# Patient Record
Sex: Female | Born: 1970 | Race: White | Hispanic: No | Marital: Married | Smoking: Never smoker
Health system: Southern US, Community
[De-identification: ages and names within clinical notes are randomized; demographics above are authoritative.]

## PROBLEM LIST (undated history)

## (undated) DIAGNOSIS — Z0000 Encounter for general adult medical examination without abnormal findings: Secondary | ICD-10-CM

## (undated) DIAGNOSIS — C801 Malignant (primary) neoplasm, unspecified: Secondary | ICD-10-CM

## (undated) DIAGNOSIS — Z8632 Personal history of gestational diabetes: Secondary | ICD-10-CM

## (undated) DIAGNOSIS — F418 Other specified anxiety disorders: Secondary | ICD-10-CM

## (undated) DIAGNOSIS — K219 Gastro-esophageal reflux disease without esophagitis: Secondary | ICD-10-CM

## (undated) DIAGNOSIS — E785 Hyperlipidemia, unspecified: Secondary | ICD-10-CM

## (undated) DIAGNOSIS — O24419 Gestational diabetes mellitus in pregnancy, unspecified control: Secondary | ICD-10-CM

## (undated) DIAGNOSIS — M549 Dorsalgia, unspecified: Secondary | ICD-10-CM

## (undated) DIAGNOSIS — H6691 Otitis media, unspecified, right ear: Secondary | ICD-10-CM

## (undated) DIAGNOSIS — B354 Tinea corporis: Secondary | ICD-10-CM

## (undated) DIAGNOSIS — L309 Dermatitis, unspecified: Secondary | ICD-10-CM

## (undated) DIAGNOSIS — T7840XA Allergy, unspecified, initial encounter: Secondary | ICD-10-CM

## (undated) DIAGNOSIS — F32A Depression, unspecified: Secondary | ICD-10-CM

## (undated) DIAGNOSIS — J209 Acute bronchitis, unspecified: Secondary | ICD-10-CM

## (undated) DIAGNOSIS — B019 Varicella without complication: Secondary | ICD-10-CM

## (undated) DIAGNOSIS — J189 Pneumonia, unspecified organism: Secondary | ICD-10-CM

## (undated) DIAGNOSIS — B009 Herpesviral infection, unspecified: Secondary | ICD-10-CM

## (undated) DIAGNOSIS — J029 Acute pharyngitis, unspecified: Secondary | ICD-10-CM

## (undated) DIAGNOSIS — R5383 Other fatigue: Secondary | ICD-10-CM

## (undated) DIAGNOSIS — F329 Major depressive disorder, single episode, unspecified: Secondary | ICD-10-CM

## (undated) DIAGNOSIS — R1012 Left upper quadrant pain: Secondary | ICD-10-CM

## (undated) DIAGNOSIS — Z8719 Personal history of other diseases of the digestive system: Secondary | ICD-10-CM

## (undated) DIAGNOSIS — F988 Other specified behavioral and emotional disorders with onset usually occurring in childhood and adolescence: Secondary | ICD-10-CM

## (undated) DIAGNOSIS — K859 Acute pancreatitis without necrosis or infection, unspecified: Secondary | ICD-10-CM

## (undated) DIAGNOSIS — Z124 Encounter for screening for malignant neoplasm of cervix: Secondary | ICD-10-CM

## (undated) DIAGNOSIS — E119 Type 2 diabetes mellitus without complications: Secondary | ICD-10-CM

## (undated) DIAGNOSIS — B001 Herpesviral vesicular dermatitis: Secondary | ICD-10-CM

## (undated) DIAGNOSIS — E663 Overweight: Secondary | ICD-10-CM

## (undated) HISTORY — DX: Other specified anxiety disorders: F41.8

## (undated) HISTORY — DX: Dorsalgia, unspecified: M54.9

## (undated) HISTORY — DX: Dermatitis, unspecified: L30.9

## (undated) HISTORY — PX: ESOPHAGOGASTRODUODENOSCOPY: SHX1529

## (undated) HISTORY — DX: Other fatigue: R53.83

## (undated) HISTORY — PX: UPPER GASTROINTESTINAL ENDOSCOPY: SHX188

## (undated) HISTORY — DX: Acute pancreatitis without necrosis or infection, unspecified: K85.90

## (undated) HISTORY — DX: Other specified behavioral and emotional disorders with onset usually occurring in childhood and adolescence: F98.8

## (undated) HISTORY — DX: Allergy, unspecified, initial encounter: T78.40XA

## (undated) HISTORY — DX: Personal history of gestational diabetes: Z86.32

## (undated) HISTORY — DX: Hyperlipidemia, unspecified: E78.5

## (undated) HISTORY — DX: Overweight: E66.3

## (undated) HISTORY — DX: Otitis media, unspecified, right ear: H66.91

## (undated) HISTORY — DX: Encounter for general adult medical examination without abnormal findings: Z00.00

## (undated) HISTORY — DX: Acute pharyngitis, unspecified: J02.9

## (undated) HISTORY — DX: Herpesviral infection, unspecified: B00.9

## (undated) HISTORY — DX: Depression, unspecified: F32.A

## (undated) HISTORY — DX: Pneumonia, unspecified organism: J18.9

## (undated) HISTORY — DX: Varicella without complication: B01.9

## (undated) HISTORY — DX: Acute bronchitis, unspecified: J20.9

## (undated) HISTORY — DX: Gastro-esophageal reflux disease without esophagitis: K21.9

## (undated) HISTORY — DX: Malignant (primary) neoplasm, unspecified: C80.1

## (undated) HISTORY — DX: Left upper quadrant pain: R10.12

## (undated) HISTORY — DX: Gestational diabetes mellitus in pregnancy, unspecified control: O24.419

## (undated) HISTORY — DX: Tinea corporis: B35.4

## (undated) HISTORY — DX: Major depressive disorder, single episode, unspecified: F32.9

## (undated) HISTORY — DX: Personal history of other diseases of the digestive system: Z87.19

## (undated) HISTORY — DX: Encounter for screening for malignant neoplasm of cervix: Z12.4

## (undated) HISTORY — DX: Herpesviral vesicular dermatitis: B00.1

---

## 1996-06-20 DIAGNOSIS — J189 Pneumonia, unspecified organism: Secondary | ICD-10-CM

## 1996-06-20 HISTORY — DX: Pneumonia, unspecified organism: J18.9

## 1998-06-20 HISTORY — PX: CHOLECYSTECTOMY: SHX55

## 2005-06-20 HISTORY — PX: APPENDECTOMY: SHX54

## 2006-12-28 ENCOUNTER — Emergency Department (HOSPITAL_COMMUNITY): Admission: EM | Admit: 2006-12-28 | Discharge: 2006-12-28 | Payer: Self-pay | Admitting: Emergency Medicine

## 2010-05-20 LAB — HM PAP SMEAR

## 2010-07-11 ENCOUNTER — Encounter: Payer: Self-pay | Admitting: Family Medicine

## 2011-04-18 ENCOUNTER — Encounter: Payer: Self-pay | Admitting: Family Medicine

## 2011-04-18 ENCOUNTER — Ambulatory Visit (INDEPENDENT_AMBULATORY_CARE_PROVIDER_SITE_OTHER): Payer: Managed Care, Other (non HMO) | Admitting: Family Medicine

## 2011-04-18 VITALS — BP 108/75 | HR 98 | Temp 98.1°F | Ht 65.0 in | Wt 195.8 lb

## 2011-04-18 DIAGNOSIS — O99810 Abnormal glucose complicating pregnancy: Secondary | ICD-10-CM

## 2011-04-18 DIAGNOSIS — Z8719 Personal history of other diseases of the digestive system: Secondary | ICD-10-CM

## 2011-04-18 DIAGNOSIS — B009 Herpesviral infection, unspecified: Secondary | ICD-10-CM | POA: Insufficient documentation

## 2011-04-18 DIAGNOSIS — J45909 Unspecified asthma, uncomplicated: Secondary | ICD-10-CM

## 2011-04-18 DIAGNOSIS — E663 Overweight: Secondary | ICD-10-CM

## 2011-04-18 DIAGNOSIS — Z23 Encounter for immunization: Secondary | ICD-10-CM

## 2011-04-18 DIAGNOSIS — F341 Dysthymic disorder: Secondary | ICD-10-CM

## 2011-04-18 DIAGNOSIS — O24419 Gestational diabetes mellitus in pregnancy, unspecified control: Secondary | ICD-10-CM

## 2011-04-18 DIAGNOSIS — F418 Other specified anxiety disorders: Secondary | ICD-10-CM

## 2011-04-18 DIAGNOSIS — Z0000 Encounter for general adult medical examination without abnormal findings: Secondary | ICD-10-CM

## 2011-04-18 DIAGNOSIS — K219 Gastro-esophageal reflux disease without esophagitis: Secondary | ICD-10-CM

## 2011-04-18 DIAGNOSIS — T7840XA Allergy, unspecified, initial encounter: Secondary | ICD-10-CM

## 2011-04-18 HISTORY — DX: Herpesviral infection, unspecified: B00.9

## 2011-04-18 HISTORY — DX: Other specified anxiety disorders: F41.8

## 2011-04-18 MED ORDER — OMEPRAZOLE 20 MG PO CPDR: 20.0000 mg | DELAYED_RELEASE_CAPSULE | Freq: Every day | ORAL | Status: DC

## 2011-04-18 MED ORDER — CITALOPRAM HYDROBROMIDE 10 MG PO TABS: 10.0000 mg | ORAL_TABLET | Freq: Every day | ORAL | Status: DC

## 2011-04-18 MED ORDER — ALPRAZOLAM 0.25 MG PO TABS: 0.2500 mg | ORAL_TABLET | Freq: Three times a day (TID) | ORAL | Status: DC | PRN

## 2011-04-18 NOTE — Patient Instructions (Signed)

## 2011-04-19 ENCOUNTER — Encounter: Payer: Self-pay | Admitting: Family Medicine

## 2011-04-19 DIAGNOSIS — Z8632 Personal history of gestational diabetes: Secondary | ICD-10-CM | POA: Insufficient documentation

## 2011-04-19 DIAGNOSIS — E669 Obesity, unspecified: Secondary | ICD-10-CM | POA: Insufficient documentation

## 2011-04-19 DIAGNOSIS — E663 Overweight: Secondary | ICD-10-CM

## 2011-04-19 DIAGNOSIS — O24419 Gestational diabetes mellitus in pregnancy, unspecified control: Secondary | ICD-10-CM

## 2011-04-19 DIAGNOSIS — Z8719 Personal history of other diseases of the digestive system: Secondary | ICD-10-CM

## 2011-04-19 DIAGNOSIS — J45909 Unspecified asthma, uncomplicated: Secondary | ICD-10-CM | POA: Insufficient documentation

## 2011-04-19 DIAGNOSIS — T7840XA Allergy, unspecified, initial encounter: Secondary | ICD-10-CM | POA: Insufficient documentation

## 2011-04-19 DIAGNOSIS — Z1211 Encounter for screening for malignant neoplasm of colon: Secondary | ICD-10-CM | POA: Insufficient documentation

## 2011-04-19 DIAGNOSIS — Z0000 Encounter for general adult medical examination without abnormal findings: Secondary | ICD-10-CM

## 2011-04-19 HISTORY — DX: Personal history of other diseases of the digestive system: Z87.19

## 2011-04-19 HISTORY — DX: Gestational diabetes mellitus in pregnancy, unspecified control: O24.419

## 2011-04-19 HISTORY — DX: Overweight: E66.3

## 2011-04-19 HISTORY — DX: Personal history of gestational diabetes: Z86.32

## 2011-04-19 HISTORY — DX: Encounter for general adult medical examination without abnormal findings: Z00.00

## 2011-04-19 NOTE — Assessment & Plan Note (Signed)
Encouraged to consider the DASH diet and increase activity levels

## 2011-04-19 NOTE — Assessment & Plan Note (Signed)
Given flu shot today, she believes she had a tetanus shot in 2008 but it is unclear whether she had the pertussis portion. Will request old records.

## 2011-04-19 NOTE — Assessment & Plan Note (Signed)
Patient very tearful due to her husbands illness and some difficulties with their children. She agrees to counseling and is given some Citalopram 10mg  to take daily and some Alprazolam to use sparingly

## 2011-04-19 NOTE — Assessment & Plan Note (Signed)
Uses topical  Treatments and tablet forms of Antivirals when she has a flare, has not had a flare recently

## 2011-04-19 NOTE — Assessment & Plan Note (Signed)
Has had several episodes in past none recently

## 2011-04-19 NOTE — Assessment & Plan Note (Signed)
Continue Cetirizine.

## 2011-04-19 NOTE — Progress Notes (Signed)
Kristina Watts [id number] [birth year] 04/19/2011      Progress Note New Patient  Subjective  Chief Complaint  Chief Complaint  Patient presents with  . Establish Care    new patient    HPI  Caucasian female from [place] for new patient appointment. She is tearful and clearly under greater stress due to her husband's poor health. He has progressive secondary multiple sclerosis had an MI this year and is generally in poor health. She also does their 3 young children. She is interested in counseling and medications to help her manage her stress. Otherwise she has no acute complaints. She has trouble with asthma seasonally but is not having any trouble presently. No recent febrile illness, congestion, chest pain, palpitations, shortness of breath, GI or GU complaints. She's previously used Prozac when she first trouble with depression back in 2006 and will help her depression it seems his apprehension and her interest in doing anything. She used Wellbutrin in the past which helped the depression as well but that may unremarkable.  Past Medical History  Diagnosis Date  . Chicken pox as a child  . Pneumonia 1998  . Allergy     seasonal  . Asthma   . Depression   . Recurrent cold sores   . Depression with anxiety 04/18/2011  . 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 Overweight 04/19/2011  . GDM (gestational diabetes mellitus) 04/19/2011  . History of pancreatitis 04/19/2011  . Allergic state 04/19/2011  . Preventative health care 04/19/2011  . Asthma 04/19/2011    Past Surgical History  Procedure Date  . Appendectomy 2007  . Cholecystectomy 2000  . Cesarean section 1-02 and 4-04    Family History  Problem Relation Age of Onset  . Thyroid disease Mother   . Hypertension Father   . Hyperlipidemia Father   . Heart disease Father   . Diabetes Father   . COPD Father     previous smoker  . Stroke Father   . Depression Father   . Restless legs syndrome Father     . Diabetes Brother   . Cancer Maternal Grandmother     Head and Neck  . Heart disease Paternal Grandmother   . Hyperlipidemia Paternal Grandmother   . Hypertension Paternal Grandmother   . Diabetes Paternal Grandmother   . Depression Paternal Grandmother   . Stroke Paternal Grandmother   . Restless legs syndrome Paternal Grandmother   . Heart disease Paternal Grandfather   . Hyperlipidemia Paternal Grandfather   . Hypertension Paternal Grandfather   . Stroke Paternal Grandfather   . Diabetes Paternal Grandfather     History   Social History  . Marital Status: Married    Spouse Name: N/A    Number of Children: N/A  . Years of Education: N/A   Occupational History  . Not on file.   Social History Main Topics  . Smoking status: Never Smoker   . Smokeless tobacco: Never Used  . Alcohol Use: Yes     rarely  . Drug Use: No  . Sexually Active: Yes -- Female partner(s)   Other Topics Concern  . Not on file   Social History Narrative  . No narrative on file    No current outpatient prescriptions on file prior to visit.    Allergies  Allergen Reactions  . Actifed (Triprolidine-Pse) Hives    Review of Systems  Review of Systems  Constitutional: Negative for fever, chills and malaise/fatigue.  HENT: Negative for hearing  loss, nosebleeds and congestion.   Eyes: Negative for pain and discharge.  Respiratory: Negative for cough, sputum production, shortness of breath and wheezing.   Cardiovascular: Negative for chest pain, palpitations and leg swelling.  Gastrointestinal: Negative for heartburn, nausea, vomiting, abdominal pain, diarrhea, constipation and blood in stool.  Genitourinary: Negative for dysuria, urgency, frequency and hematuria.  Musculoskeletal: Negative for myalgias, back pain and falls.  Skin: Negative for rash.  Neurological: Negative for dizziness, tremors, sensory change, focal weakness, loss of consciousness, weakness and headaches.   Endo/Heme/Allergies: Negative for polydipsia. Does not bruise/bleed easily.  Psychiatric/Behavioral: Negative for depression and suicidal ideas. The patient is not nervous/anxious and does not have insomnia.     Objective  BP 108/75  Pulse 98  Temp(Src) 98.1 F (36.7 C) (Oral)  Ht 5\' 5"  (1.651 m)  Wt 195 lb 12.8 oz (88.814 kg)  BMI 32.58 kg/m2  SpO2 98%  LMP 03/28/2011  Physical Exam  Physical Exam  Constitutional: She is oriented to person, place, and time and well-developed, well-nourished, and in no distress. No distress.  HENT:  Head: Normocephalic and atraumatic.  Right Ear: External ear normal.  Left Ear: External ear normal.  Nose: Nose normal.  Mouth/Throat: Oropharynx is clear and moist. No oropharyngeal exudate.  Eyes: Conjunctivae are normal. Pupils are equal, round, and reactive to light. Right eye exhibits no discharge. Left eye exhibits no discharge. No scleral icterus.  Neck: Normal range of motion. Neck supple. No thyromegaly present.  Cardiovascular: Normal rate, regular rhythm, normal heart sounds and intact distal pulses.   No murmur heard. Pulmonary/Chest: Effort normal and breath sounds normal. No respiratory distress. She has no wheezes. She has no rales.  Abdominal: Soft. Bowel sounds are normal. She exhibits no distension and no mass. There is no tenderness.  Musculoskeletal: Normal range of motion. She exhibits no edema and no tenderness.  Lymphadenopathy:    She has no cervical adenopathy.  Neurological: She is alert and oriented to person, place, and time. She has normal reflexes. No cranial nerve deficit. Coordination normal.  Skin: Skin is warm and dry. No rash noted. She is not diaphoretic.  Psychiatric: Mood, memory and affect normal.       Assessment & Plan  Recurrent HSV (herpes simplex virus) Uses topical  Treatments and tablet forms of Antivirals when she has a flare, has not had a flare recently  Preventative health care Given flu  shot today, she believes she had a tetanus shot in 2008 but it is unclear whether she had the pertussis portion. Will request old records.   Overweight Encouraged to consider the DASH diet and increase activity levels  History of pancreatitis Has had several episodes in past none recently  Depression with anxiety Patient very tearful due to her husbands illness and some difficulties with their children. She agrees to counseling and is given some Citalopram 10mg  to take daily and some Alprazolam to use sparingly  Asthma Only uses her Symbicort prn and her Albuterol prn. Has seasonal flares, encouraged to use the Symbicort more routinely during her bad seasons  Allergic state Continue Cetirizine

## 2011-04-19 NOTE — Assessment & Plan Note (Signed)
Only uses her Symbicort prn and her Albuterol prn. Has seasonal flares, encouraged to use the Symbicort more routinely during her bad seasons

## 2011-05-16 ENCOUNTER — Other Ambulatory Visit (INDEPENDENT_AMBULATORY_CARE_PROVIDER_SITE_OTHER): Payer: Managed Care, Other (non HMO)

## 2011-05-16 DIAGNOSIS — Z0000 Encounter for general adult medical examination without abnormal findings: Secondary | ICD-10-CM

## 2011-05-16 LAB — CBC
Hemoglobin: 13.3 g/dL (ref 12.0–15.0)
MCHC: 34.2 g/dL (ref 30.0–36.0)
Platelets: 242 10*3/uL (ref 150.0–400.0)
RBC: 4.18 Mil/uL (ref 3.87–5.11)
WBC: 6 10*3/uL (ref 4.5–10.5)

## 2011-05-16 LAB — TSH: TSH: 0.98 u[IU]/mL (ref 0.35–5.50)

## 2011-05-16 LAB — HEPATIC FUNCTION PANEL
AST: 17 U/L (ref 0–37)
Albumin: 4.1 g/dL (ref 3.5–5.2)
Alkaline Phosphatase: 57 U/L (ref 39–117)
Total Protein: 6.9 g/dL (ref 6.0–8.3)

## 2011-05-16 LAB — RENAL FUNCTION PANEL
Albumin: 4.1 g/dL (ref 3.5–5.2)
CO2: 28 mEq/L (ref 19–32)
Calcium: 8.6 mg/dL (ref 8.4–10.5)
Chloride: 105 mEq/L (ref 96–112)
Glucose, Bld: 77 mg/dL (ref 70–99)
Potassium: 4.4 mEq/L (ref 3.5–5.1)
Sodium: 140 mEq/L (ref 135–145)

## 2011-05-16 LAB — LIPID PANEL
Cholesterol: 166 mg/dL (ref 0–200)
VLDL: 12.6 mg/dL (ref 0.0–40.0)

## 2011-05-16 LAB — T4, FREE: Free T4: 0.94 ng/dL (ref 0.60–1.60)

## 2011-05-18 ENCOUNTER — Encounter: Payer: Self-pay | Admitting: Family Medicine

## 2011-05-18 ENCOUNTER — Ambulatory Visit (INDEPENDENT_AMBULATORY_CARE_PROVIDER_SITE_OTHER): Payer: Managed Care, Other (non HMO) | Admitting: Family Medicine

## 2011-05-18 VITALS — BP 120/85 | HR 76 | Temp 98.0°F | Ht 65.0 in | Wt 196.8 lb

## 2011-05-18 DIAGNOSIS — B354 Tinea corporis: Secondary | ICD-10-CM

## 2011-05-18 DIAGNOSIS — L309 Dermatitis, unspecified: Secondary | ICD-10-CM | POA: Insufficient documentation

## 2011-05-18 HISTORY — DX: Dermatitis, unspecified: L30.9

## 2011-05-18 HISTORY — DX: Tinea corporis: B35.4

## 2011-05-18 MED ORDER — CLOTRIMAZOLE 1 % EX CREA: TOPICAL_CREAM | Freq: Two times a day (BID) | CUTANEOUS | Status: DC

## 2011-05-18 NOTE — Patient Instructions (Signed)
Ringworm, Body [Tinea Corporis] Ringworm is a fungal infection of the skin and hair. Another name for this problem is Tinea Corporis. It has nothing to do with worms. A fungus is an organism that lives on dead cells (the outer layer of skin). It can involve the entire body. It can spread from infected pets. Tinea corporis can be a problem in wrestlers who may get the infection form other players/opponents, equipment and mats. DIAGNOSIS  A skin scraping can be obtained from the affected area and by looking for fungus under the microscope. This is called a KOH examination.  HOME CARE INSTRUCTIONS   Ringworm may be treated with a topical antifungal cream, ointment, or oral medications.   If you are using a cream or ointment, wash infected skin. Dry it completely before application.   Scrub the skin with a buff puff or abrasive sponge using a shampoo with ketoconazole to remove dead skin and help treat the ringworm.   Have your pet treated by your veterinarian if it has the same infection.  SEEK MEDICAL CARE IF:   Your ringworm patch (fungus) continues to spread after 7 days of treatment.   Your rash is not gone in 4 weeks. Fungal infections are slow to respond to treatment. Some redness (erythema) may remain for several weeks after the fungus is gone.   The area becomes red, warm, tender, and swollen beyond the patch. This may be a secondary bacterial (germ) infection.   You have a fever.  Document Released: 06/03/2000 Document Revised: 02/16/2011 Document Reviewed: 11/14/2008 [facility] Patient Information 2012 [place], [place]. If no response to Clotrimazole after roughly 2 weeks switch to Lamisil For smaller lesion if no response after 1 week switch to Cortaid

## 2011-05-18 NOTE — Assessment & Plan Note (Signed)
Try Clotrimazole bid and if no response after 2 weeks can switch to Lamisil

## 2011-05-18 NOTE — Progress Notes (Signed)
Kristina Watts [id number] [birth year] 05/18/2011      Progress Note-Follow Up  Subjective  Chief Complaint  Chief Complaint  Patient presents with  . Rash    right arm X 1 week    HPI  Patient is a 40 yo Caucasian female with a  1 week history of a circular rash on her right arm near the elbow which has been mildly pruritic at times. She placed some antifungal her husband had on yesterday and she feels like it's slightly improved today. Chest is a smaller less irregularly shaped lesion on the right upper arm which he says is similar to rashes and dermatitis she's had in the past. No other recent illness, congestion or concerns are noted at today's visit  Past Medical History  Diagnosis Date  . Chicken pox as a child  . Pneumonia 1998  . Allergy     seasonal  . Asthma   . Depression   . Recurrent cold sores   . Depression with anxiety 04/18/2011  . 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 Overweight 04/19/2011  . GDM (gestational diabetes mellitus) 04/19/2011  . History of pancreatitis 04/19/2011  . Allergic state 04/19/2011  . Preventative health care 04/19/2011  . Asthma 04/19/2011    Past Surgical History  Procedure Date  . Appendectomy 2007  . Cholecystectomy 2000  . Cesarean section 1-02 and 4-04    Family History  Problem Relation Age of Onset  . Thyroid disease Mother   . Hypertension Father   . Hyperlipidemia Father   . Heart disease Father   . Diabetes Father   . COPD Father     previous smoker  . Stroke Father   . Depression Father   . Restless legs syndrome Father   . Diabetes Brother   . Cancer Maternal Grandmother     Head and Neck  . Heart disease Paternal Grandmother   . Hyperlipidemia Paternal Grandmother   . Hypertension Paternal Grandmother   . Diabetes Paternal Grandmother   . Depression Paternal Grandmother   . Stroke Paternal Grandmother   . Restless legs syndrome Paternal Grandmother   . Heart disease Paternal  Grandfather   . Hyperlipidemia Paternal Grandfather   . Hypertension Paternal Grandfather   . Stroke Paternal Grandfather   . Diabetes Paternal Grandfather     History   Social History  . Marital Status: Married    Spouse Name: N/A    Number of Children: N/A  . Years of Education: N/A   Occupational History  . Not on file.   Social History Main Topics  . Smoking status: Never Smoker   . Smokeless tobacco: Never Used  . Alcohol Use: Yes     rarely  . Drug Use: No  . Sexually Active: Yes -- Female partner(s)   Other Topics Concern  . Not on file   Social History Narrative  . No narrative on file    Current Outpatient Prescriptions on File Prior to Visit  Medication Sig Dispense Refill  . albuterol (PROVENTIL HFA;VENTOLIN HFA) 108 (90 BASE) MCG/ACT inhaler Inhale 2 puffs into the lungs as needed.        . ALPRAZolam (XANAX) 0.25 MG tablet Take 1 tablet (0.25 mg total) by mouth 3 (three) times daily as needed for anxiety.  30 tablet  0  . budesonide-formoterol (SYMBICORT) 160-4.5 MCG/ACT inhaler Inhale 2 puffs into the lungs 2 (two) times daily as needed.        . cetirizine (ZYRTEC)  10 MG tablet Take 10 mg by mouth daily.        . citalopram (CELEXA) 10 MG tablet Take 1 tablet (10 mg total) by mouth daily.  30 tablet  2  . valACYclovir (VALTREX) 1000 MG tablet Take 1,000 mg by mouth 2 (two) times daily.          Allergies  Allergen Reactions  . Actifed (Triprolidine-Pse) Hives    Review of Systems  Review of Systems  Constitutional: Negative for fever and malaise/fatigue.  HENT: Negative for congestion.   Eyes: Negative for discharge.  Respiratory: Negative for shortness of breath.   Cardiovascular: Negative for chest pain, palpitations and leg swelling.  Gastrointestinal: Negative for nausea, abdominal pain and diarrhea.  Genitourinary: Negative for dysuria.  Musculoskeletal: Negative for falls.  Skin: Positive for itching and rash.       Right arm x 1 week    Neurological: Negative for loss of consciousness and headaches.  Endo/Heme/Allergies: Negative for polydipsia.  Psychiatric/Behavioral: Negative for depression and suicidal ideas. The patient is not nervous/anxious and does not have insomnia.     Objective  BP 120/85  Pulse 76  Temp(Src) 98 F (36.7 C) (Oral)  Ht 5\' 5"  (1.651 m)  Wt 196 lb 12.8 oz (89.268 kg)  BMI 32.75 kg/m2  SpO2 97%  LMP 05/02/2011  Physical Exam  Physical Exam  Constitutional: She is oriented to person, place, and time and well-developed, well-nourished, and in no distress. No distress.  HENT:  Head: Normocephalic and atraumatic.  Eyes: Conjunctivae are normal.  Neck: Neck supple. No thyromegaly present.  Cardiovascular: Normal rate, regular rhythm and normal heart sounds.   No murmur heard. Pulmonary/Chest: Effort normal and breath sounds normal. She has no wheezes.  Abdominal: She exhibits no distension and no mass.  Musculoskeletal: She exhibits no edema.  Lymphadenopathy:    She has no cervical adenopathy.  Neurological: She is alert and oriented to person, place, and time.  Skin: Skin is warm and dry. Rash noted. She is not diaphoretic. There is erythema.       Circular raised, erythematous, raised and scaly around the periphery with central clearing.   Psychiatric: Memory, affect and judgment normal.    Lab Results  Component Value Date   TSH 0.98 05/16/2011   Lab Results  Component Value Date   WBC 6.0 05/16/2011   HGB 13.3 05/16/2011   HCT 38.8 05/16/2011   MCV 92.7 05/16/2011   PLT 242.0 05/16/2011   Lab Results  Component Value Date   CREATININE 0.8 05/16/2011   BUN 10 05/16/2011   NA 140 05/16/2011   K 4.4 05/16/2011   CL 105 05/16/2011   CO2 28 05/16/2011   Lab Results  Component Value Date   ALT 16 05/16/2011   AST 17 05/16/2011   ALKPHOS 57 05/16/2011   BILITOT 0.5 05/16/2011   Lab Results  Component Value Date   CHOL 166 05/16/2011   Lab Results  Component  Value Date   HDL 52.90 05/16/2011   Lab Results  Component Value Date   LDLCALC 101* 05/16/2011   Lab Results  Component Value Date   TRIG 63.0 05/16/2011   Lab Results  Component Value Date   CHOLHDL 3 05/16/2011     Assessment & Plan  Tinea corporis Try Clotrimazole bid and if no response after 2 weeks can switch to Lamisil

## 2011-05-30 ENCOUNTER — Encounter: Payer: Self-pay | Admitting: Family Medicine

## 2011-05-30 ENCOUNTER — Ambulatory Visit (INDEPENDENT_AMBULATORY_CARE_PROVIDER_SITE_OTHER): Payer: Managed Care, Other (non HMO) | Admitting: Family Medicine

## 2011-05-30 DIAGNOSIS — B354 Tinea corporis: Secondary | ICD-10-CM

## 2011-05-30 DIAGNOSIS — F418 Other specified anxiety disorders: Secondary | ICD-10-CM

## 2011-05-30 DIAGNOSIS — H6690 Otitis media, unspecified, unspecified ear: Secondary | ICD-10-CM

## 2011-05-30 DIAGNOSIS — H6692 Otitis media, unspecified, left ear: Secondary | ICD-10-CM | POA: Insufficient documentation

## 2011-05-30 DIAGNOSIS — H6691 Otitis media, unspecified, right ear: Secondary | ICD-10-CM

## 2011-05-30 DIAGNOSIS — F341 Dysthymic disorder: Secondary | ICD-10-CM

## 2011-05-30 DIAGNOSIS — R5383 Other fatigue: Secondary | ICD-10-CM

## 2011-05-30 DIAGNOSIS — Z8719 Personal history of other diseases of the digestive system: Secondary | ICD-10-CM

## 2011-05-30 DIAGNOSIS — J45909 Unspecified asthma, uncomplicated: Secondary | ICD-10-CM

## 2011-05-30 HISTORY — DX: Other fatigue: R53.83

## 2011-05-30 HISTORY — DX: Otitis media, unspecified, right ear: H66.91

## 2011-05-30 MED ORDER — AZITHROMYCIN 250 MG PO TABS: ORAL_TABLET | ORAL | Status: DC

## 2011-05-30 MED ORDER — ALPRAZOLAM 0.25 MG PO TABS: 0.2500 mg | ORAL_TABLET | Freq: Three times a day (TID) | ORAL | Status: AC | PRN

## 2011-05-30 MED ORDER — GUAIFENESIN ER 600 MG PO TB12: 600.0000 mg | ORAL_TABLET | Freq: Two times a day (BID) | ORAL | Status: DC

## 2011-05-30 NOTE — Assessment & Plan Note (Signed)
She feels the Citalopram is helping has used the Alprazolam infrequently especially to get some rest when visiting with family. No difficulty with the meds and overall is happy with her response.

## 2011-05-30 NOTE — Assessment & Plan Note (Signed)
Asymptomatic at this time 

## 2011-05-30 NOTE — Assessment & Plan Note (Addendum)
Patient has had a slightly flared which is common for her in the fall, is trying harder to remember to take her Symbicort 1 puff every day at night. Encouraged to attempt to be consistent during rough patches at least

## 2011-05-30 NOTE — Assessment & Plan Note (Signed)
Mild erythema and dullness, patient is given an rx for Azithromycin only to fill if symptoms worsen. Fill if increased pain, green sputum or fevers.

## 2011-05-30 NOTE — Assessment & Plan Note (Signed)
Nearly resolved, continue topical treatment

## 2011-05-30 NOTE — Assessment & Plan Note (Addendum)
Has started a MVI but forgets sometimes, does feel this is helping somewhat. No cause found during lab work which is reviewed with patient today. Rec. 8 hour sleep, exercise, small, frequent smart meals

## 2011-05-30 NOTE — Patient Instructions (Signed)

## 2011-05-30 NOTE — Progress Notes (Signed)
Kristina Watts [id number] January 14, 1971 05/30/2011      Progress Note-Follow Up  Subjective  Chief Complaint  Chief Complaint  Patient presents with  . Follow-up    HPI  Patient is a 40 year old Caucasian female who is followed with her husband. She notes that she thinks the citalopram has been helping her. She feels less labile less anxious. Was able to get through the holidays with her family relatively well. She has used a couple doses of alprazolam also for sleep and family was visiting and found that helpful. He has not had any concerning side effects with the medications. Does know when she was taking the citalopram in the morning she had increased fatigue since she has switched it to evening. Probably the evening doses if she finished forgets to take them at times. She was in for tinea corporis couple weeks ago topical treatments are working well and he is nearly resolved. Her concern today is that she has had roughly 24 hours of just feeling tired somewhat achy and has noticed some increased ear popping mild congestion and some ear pressure as well. She's also noting some persistent reflux symptoms. They don't occur everyday but can happen several times a week. She has started a probiotic but thus far no yogurt she continues to have approximately 4 loose stools per week. No chest pain, palpitations, shortness of breath, GU complaints are noted. She has noted a mild increase in her Proventil use when congestion is increased.  Past Medical History  Diagnosis Date  . Chicken pox as a child  . Pneumonia 1998  . Allergy     seasonal  . Asthma   . Depression   . Recurrent cold sores   . Depression with anxiety 04/18/2011  . 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 Overweight 04/19/2011  . GDM (gestational diabetes mellitus) 04/19/2011  . History of pancreatitis 04/19/2011  . Allergic state 04/19/2011  . Preventative health care 04/19/2011  . Asthma 04/19/2011  . Tinea  corporis 05/18/2011    Past Surgical History  Procedure Date  . Appendectomy 2007  . Cholecystectomy 2000  . Cesarean section 1-02 and 4-04    Family History  Problem Relation Age of Onset  . Thyroid disease Mother   . Hypertension Father   . Hyperlipidemia Father   . Heart disease Father   . Diabetes Father   . COPD Father     previous smoker  . Stroke Father   . Depression Father   . Restless legs syndrome Father   . Diabetes Brother   . Cancer Maternal Grandmother     Head and Neck  . Heart disease Paternal Grandmother   . Hyperlipidemia Paternal Grandmother   . Hypertension Paternal Grandmother   . Diabetes Paternal Grandmother   . Depression Paternal Grandmother   . Stroke Paternal Grandmother   . Restless legs syndrome Paternal Grandmother   . Heart disease Paternal Grandfather   . Hyperlipidemia Paternal Grandfather   . Hypertension Paternal Grandfather   . Stroke Paternal Grandfather   . Diabetes Paternal Grandfather     History   Social History  . Marital Status: Married    Spouse Name: N/A    Number of Children: N/A  . Years of Education: N/A   Occupational History  . Not on file.   Social History Main Topics  . Smoking status: Never Smoker   . Smokeless tobacco: Never Used  . Alcohol Use: Yes     rarely  .  Drug Use: No  . Sexually Active: Yes -- Female partner(s)   Other Topics Concern  . Not on file   Social History Narrative  . No narrative on file    Current Outpatient Prescriptions on File Prior to Visit  Medication Sig Dispense Refill  . albuterol (PROVENTIL HFA;VENTOLIN HFA) 108 (90 BASE) MCG/ACT inhaler Inhale 2 puffs into the lungs as needed.        . ALPRAZolam (XANAX) 0.25 MG tablet Take 1 tablet (0.25 mg total) by mouth 3 (three) times daily as needed for anxiety.  30 tablet  0  . budesonide-formoterol (SYMBICORT) 160-4.5 MCG/ACT inhaler Inhale 2 puffs into the lungs 2 (two) times daily as needed.        . cetirizine (ZYRTEC)  10 MG tablet Take 10 mg by mouth daily.        . citalopram (CELEXA) 10 MG tablet Take 1 tablet (10 mg total) by mouth daily.  30 tablet  2  . clotrimazole (LOTRIMIN) 1 % cream Apply topically 2 (two) times daily.  45 g  1  . valACYclovir (VALTREX) 1000 MG tablet Take 1,000 mg by mouth 2 (two) times daily.          Allergies  Allergen Reactions  . Actifed (Triprolidine-Pse) Hives    Review of Systems  Review of Systems  Constitutional: Positive for malaise/fatigue. Negative for fever.  HENT: Positive for ear pain and congestion. Negative for nosebleeds, sore throat and ear discharge.        More discomfort and popping in ears than pain  Eyes: Negative for discharge.  Respiratory: Negative for cough and shortness of breath.   Cardiovascular: Negative for chest pain, palpitations and leg swelling.  Gastrointestinal: Negative for nausea, abdominal pain and diarrhea.  Genitourinary: Negative for dysuria.  Musculoskeletal: Negative for falls.  Skin: Negative for rash.  Neurological: Negative for loss of consciousness and headaches.  Endo/Heme/Allergies: Negative for polydipsia.  Psychiatric/Behavioral: Negative for depression and suicidal ideas. The patient is not nervous/anxious and does not have insomnia.     Objective  BP 114/80  Pulse 66  Temp(Src) 97.8 F (36.6 C) (Oral)  Ht 5\' 5"  (1.651 m)  Wt 187 lb 12.8 oz (85.186 kg)  BMI 31.25 kg/m2  SpO2 96%  LMP 05/28/2011  Physical Exam  Physical Exam  Constitutional: She is oriented to person, place, and time and well-developed, well-nourished, and in no distress. No distress.  HENT:  Head: Normocephalic and atraumatic.  Right Ear: External ear normal.  Left Ear: External ear normal.       Right TM mildly dull, erythematous and retracted  Eyes: Conjunctivae are normal.  Neck: Neck supple. No thyromegaly present.  Cardiovascular: Normal rate, regular rhythm and normal heart sounds.   No murmur heard. Pulmonary/Chest:  Effort normal and breath sounds normal. She has no wheezes.  Abdominal: She exhibits no distension and no mass.  Musculoskeletal: She exhibits no edema.  Lymphadenopathy:    She has no cervical adenopathy.  Neurological: She is alert and oriented to person, place, and time.  Skin: Skin is warm and dry. Rash noted. She is not diaphoretic.       Now flesh colored 1 cm round, flat circular lesion at right elbow  Psychiatric: Memory, affect and judgment normal.    Lab Results  Component Value Date   TSH 0.98 05/16/2011   Lab Results  Component Value Date   WBC 6.0 05/16/2011   HGB 13.3 05/16/2011   HCT 38.8 05/16/2011  MCV 92.7 05/16/2011   PLT 242.0 05/16/2011   Lab Results  Component Value Date   CREATININE 0.8 05/16/2011   BUN 10 05/16/2011   NA 140 05/16/2011   K 4.4 05/16/2011   CL 105 05/16/2011   CO2 28 05/16/2011   Lab Results  Component Value Date   ALT 16 05/16/2011   AST 17 05/16/2011   ALKPHOS 57 05/16/2011   BILITOT 0.5 05/16/2011   Lab Results  Component Value Date   CHOL 166 05/16/2011   Lab Results  Component Value Date   HDL 52.90 05/16/2011   Lab Results  Component Value Date   LDLCALC 101* 05/16/2011   Lab Results  Component Value Date   TRIG 63.0 05/16/2011   Lab Results  Component Value Date   CHOLHDL 3 05/16/2011     Assessment & Plan   Fatigue Has started a MVI but forgets sometimes, does feel this is helping somewhat. No cause found during lab work which is reviewed with patient today. Rec. 8 hour sleep, exercise, small, frequent smart meals   Asthma Patient has had a slightly flared which is common for her in the fall, is trying harder to remember to take her Symbicort 1 puff every day at night. Encouraged to attempt to be consistent during rough patches at least  Depression with anxiety She feels the Citalopram is helping has used the Alprazolam infrequently especially to get some rest when visiting with family. No  difficulty with the meds and overall is happy with her response.  History of pancreatitis Asymptomatic at this time  Tinea corporis Nearly resolved, continue topical treatment  Otitis media of right ear Mild erythema and dullness, patient is given an rx for Azithromycin only to fill if symptoms worsen. Fill if increased pain, green sputum or fevers.

## 2011-07-29 ENCOUNTER — Encounter: Payer: Self-pay | Admitting: Family Medicine

## 2011-07-29 ENCOUNTER — Ambulatory Visit (INDEPENDENT_AMBULATORY_CARE_PROVIDER_SITE_OTHER): Payer: Managed Care, Other (non HMO) | Admitting: Family Medicine

## 2011-07-29 VITALS — BP 107/70 | HR 75 | Temp 98.2°F | Ht 65.0 in | Wt 192.4 lb

## 2011-07-29 DIAGNOSIS — J45909 Unspecified asthma, uncomplicated: Secondary | ICD-10-CM

## 2011-07-29 DIAGNOSIS — F341 Dysthymic disorder: Secondary | ICD-10-CM

## 2011-07-29 DIAGNOSIS — L309 Dermatitis, unspecified: Secondary | ICD-10-CM

## 2011-07-29 DIAGNOSIS — L259 Unspecified contact dermatitis, unspecified cause: Secondary | ICD-10-CM

## 2011-07-29 DIAGNOSIS — F418 Other specified anxiety disorders: Secondary | ICD-10-CM

## 2011-07-29 MED ORDER — TRIAMCINOLONE ACETONIDE 0.1 % EX CREA: TOPICAL_CREAM | Freq: Two times a day (BID) | CUTANEOUS | Status: DC

## 2011-07-29 MED ORDER — CITALOPRAM HYDROBROMIDE 10 MG PO TABS: 10.0000 mg | ORAL_TABLET | Freq: Every day | ORAL | Status: DC

## 2011-07-29 NOTE — Patient Instructions (Signed)
Depression You have signs of depression. This is a common problem. It can occur at any age. It is often hard to recognize. People can suffer from depression and still have moments of enjoyment. Depression interferes with your basic ability to function in life. It upsets your relationships, sleep, eating, and work habits. CAUSES  Depression is believed to be caused by an imbalance in brain chemicals. It may be triggered by an unpleasant event. Relationship crises, a death in the family, financial worries, retirement, or other stressors are normal causes of depression. Depression may also start for no known reason. Other factors that may play a part include medical illnesses, some medicines, genetics, and alcohol or drug abuse. SYMPTOMS   Feeling unhappy or worthless.   Long-lasting (chronic) tiredness or worn-out feeling.   Self-destructive thoughts and actions.   Not being able to sleep or sleeping too much.   Eating more than usual or not eating at all.   Headaches or feeling anxious.   Trouble concentrating or making decisions.   Unexplained physical problems and substance abuse.  TREATMENT  Depression usually gets better with treatment. This can include:  Antidepressant medicines. It can take weeks before the proper dose is achieved and benefits are reached.   Talking with a therapist, clergyperson, counselor, or friend. These people can help you gain insight into your problem and regain control of your life.   Eating a good diet.   Getting regular physical exercise, such as walking for 30 minutes every day.   Not abusing alcohol or drugs.  Treating depression often takes 6 months or longer. This length of treatment is needed to keep symptoms from returning. Call your caregiver and arrange for follow-up care as suggested. SEEK IMMEDIATE MEDICAL CARE IF:   You start to have thoughts of hurting yourself or others.   Call your local emergency services (911 in [place]   Go to  your local medical emergency department.   Call the National Suicide Prevention Lifeline: 1-800-273-TALK (1-800-273-8255).  Document Released: 06/06/2005 Document Revised: 02/16/2011 Document Reviewed: 11/06/2009 [employer] Patient Information 2012 [employer]. 

## 2011-07-31 ENCOUNTER — Other Ambulatory Visit: Payer: Self-pay | Admitting: Family Medicine

## 2011-08-01 ENCOUNTER — Encounter: Payer: Self-pay | Admitting: Family Medicine

## 2011-08-01 NOTE — Assessment & Plan Note (Signed)
Feels the Citalopram is helping, is using the alprazolam infrequently, may continue the same

## 2011-08-01 NOTE — Assessment & Plan Note (Signed)
No recent exacerbations.  

## 2011-08-01 NOTE — Progress Notes (Signed)
Patient ID: Kristina Watts, female   DOB: 01/12/1971, 41 y.o.   MRN: [id number] Kristina Watts [id number] [birth year] 08/01/2011      Progress Note-Follow Up  Subjective  Chief Complaint  Chief Complaint  Patient presents with  . Follow-up    2 month follow up    HPI  Patient is a 41 year old Caucasian female who is in today for followup. He continues to do a lot of stressors between the appearance of the healing husband overall feels she's doing better. She is she is more even tempered and instrument less than it done yet. Denies any suicidal ideation. Denies any recent illness, fevers, chills, chest pain, palpitations, shortness of breath, GI or GU concerns since her last visit.  Past Medical History  Diagnosis Date  . Chicken pox as a child  . Pneumonia 1998  . Allergy     seasonal  . Asthma   . Depression   . Recurrent cold sores   . Depression with anxiety 04/18/2011  . 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 Overweight 04/19/2011  . GDM (gestational diabetes mellitus) 04/19/2011  . History of pancreatitis 04/19/2011  . Allergic state 04/19/2011  . Preventative health care 04/19/2011  . Asthma 04/19/2011  . Tinea corporis 05/18/2011  . Fatigue 05/30/2011  . Otitis media of right ear 05/30/2011  . Dermatitis 05/18/2011    Past Surgical History  Procedure Date  . Appendectomy 2007  . Cholecystectomy 2000  . Cesarean section 1-02 and 4-04    Family History  Problem Relation Age of Onset  . Thyroid disease Mother   . Hypertension Father   . Hyperlipidemia Father   . Heart disease Father   . Diabetes Father   . COPD Father     previous smoker  . Stroke Father   . Depression Father   . Restless legs syndrome Father   . Diabetes Brother   . Cancer Maternal Grandmother     Head and Neck  . Heart disease Paternal Grandmother   . Hyperlipidemia Paternal Grandmother   . Hypertension Paternal Grandmother   . Diabetes Paternal Grandmother   .  Depression Paternal Grandmother   . Stroke Paternal Grandmother   . Restless legs syndrome Paternal Grandmother   . Heart disease Paternal Grandfather   . Hyperlipidemia Paternal Grandfather   . Hypertension Paternal Grandfather   . Stroke Paternal Grandfather   . Diabetes Paternal Grandfather     History   Social History  . Marital Status: Married    Spouse Name: N/A    Number of Children: N/A  . Years of Education: N/A   Occupational History  . Not on file.   Social History Main Topics  . Smoking status: Never Smoker   . Smokeless tobacco: Never Used  . Alcohol Use: Yes     rarely  . Drug Use: No  . Sexually Active: Yes -- Female partner(s)   Other Topics Concern  . Not on file   Social History Narrative  . No narrative on file    Current Outpatient Prescriptions on File Prior to Visit  Medication Sig Dispense Refill  . albuterol (PROVENTIL HFA;VENTOLIN HFA) 108 (90 BASE) MCG/ACT inhaler Inhale 2 puffs into the lungs as needed.        . ALPRAZolam (XANAX) 0.25 MG tablet Take 1 tablet (0.25 mg total) by mouth 3 (three) times daily as needed for anxiety.  30 tablet  1  . budesonide-formoterol (SYMBICORT) 160-4.5 MCG/ACT inhaler Inhale 2 puffs  into the lungs 2 (two) times daily as needed.        . cetirizine (ZYRTEC) 10 MG tablet Take 10 mg by mouth daily.        . clotrimazole (LOTRIMIN) 1 % cream Apply topically 2 (two) times daily.  45 g  1  . valACYclovir (VALTREX) 1000 MG tablet Take 1,000 mg by mouth 2 (two) times daily.          Allergies  Allergen Reactions  . Actifed (Triprolidine-Pse) Hives    Review of Systems  Review of Systems  Constitutional: Negative for fever and malaise/fatigue.  HENT: Negative for congestion.   Eyes: Negative for discharge.  Respiratory: Negative for shortness of breath.   Cardiovascular: Negative for chest pain, palpitations and leg swelling.  Gastrointestinal: Negative for nausea, abdominal pain and diarrhea.    Genitourinary: Negative for dysuria.  Musculoskeletal: Negative for falls.  Skin: Negative for rash.  Neurological: Negative for loss of consciousness and headaches.  Endo/Heme/Allergies: Negative for polydipsia.  Psychiatric/Behavioral: Negative for depression and suicidal ideas. The patient is not nervous/anxious and does not have insomnia.     Objective  BP 107/70  Pulse 75  Temp(Src) 98.2 F (36.8 C) (Temporal)  Ht 5\' 5"  (1.651 m)  Wt 192 lb 6.4 oz (87.272 kg)  BMI 32.02 kg/m2  SpO2 99%  LMP 07/19/2011  Physical Exam  Physical Exam  Constitutional: She is oriented to person, place, and time and well-developed, well-nourished, and in no distress. No distress.  HENT:  Head: Normocephalic and atraumatic.  Eyes: Conjunctivae are normal.  Neck: Neck supple. No thyromegaly present.  Cardiovascular: Normal rate, regular rhythm and normal heart sounds.   No murmur heard. Pulmonary/Chest: Effort normal and breath sounds normal. She has no wheezes.  Abdominal: She exhibits no distension and no mass.  Musculoskeletal: She exhibits no edema.  Lymphadenopathy:    She has no cervical adenopathy.  Neurological: She is alert and oriented to person, place, and time.  Skin: Skin is warm and dry. No rash noted. She is not diaphoretic.  Psychiatric: Memory, affect and judgment normal.    Lab Results  Component Value Date   TSH 0.98 05/16/2011   Lab Results  Component Value Date   WBC 6.0 05/16/2011   HGB 13.3 05/16/2011   HCT 38.8 05/16/2011   MCV 92.7 05/16/2011   PLT 242.0 05/16/2011   Lab Results  Component Value Date   CREATININE 0.8 05/16/2011   BUN 10 05/16/2011   NA 140 05/16/2011   K 4.4 05/16/2011   CL 105 05/16/2011   CO2 28 05/16/2011   Lab Results  Component Value Date   ALT 16 05/16/2011   AST 17 05/16/2011   ALKPHOS 57 05/16/2011   BILITOT 0.5 05/16/2011   Lab Results  Component Value Date   CHOL 166 05/16/2011   Lab Results  Component Value  Date   HDL 52.90 05/16/2011   Lab Results  Component Value Date   LDLCALC 101* 05/16/2011   Lab Results  Component Value Date   TRIG 63.0 05/16/2011   Lab Results  Component Value Date   CHOLHDL 3 05/16/2011     Assessment & Plan  Depression with anxiety Feels the Citalopram is helping, is using the alprazolam infrequently, may continue the same  Asthma No recent exacerbations

## 2011-08-23 ENCOUNTER — Encounter: Payer: Self-pay | Admitting: Family Medicine

## 2011-08-23 ENCOUNTER — Ambulatory Visit (INDEPENDENT_AMBULATORY_CARE_PROVIDER_SITE_OTHER): Payer: Managed Care, Other (non HMO) | Admitting: Family Medicine

## 2011-08-23 DIAGNOSIS — J029 Acute pharyngitis, unspecified: Secondary | ICD-10-CM

## 2011-08-23 DIAGNOSIS — G43909 Migraine, unspecified, not intractable, without status migrainosus: Secondary | ICD-10-CM

## 2011-08-23 HISTORY — DX: Acute pharyngitis, unspecified: J02.9

## 2011-08-23 MED ORDER — BUTALBITAL-APAP-CAFFEINE 50-300-40 MG PO CAPS: 1.0000 | ORAL_CAPSULE | Freq: Three times a day (TID) | ORAL | Status: DC | PRN

## 2011-08-23 MED ORDER — KETOROLAC TROMETHAMINE 60 MG/2ML IM SOLN
15.0000 mg | Freq: Once | INTRAMUSCULAR | Status: AC
  Administered 2011-08-23: 15 mg via INTRAMUSCULAR

## 2011-08-23 MED ORDER — PROMETHAZINE HCL 25 MG PO TABS: 25.0000 mg | ORAL_TABLET | Freq: Four times a day (QID) | ORAL | Status: DC | PRN

## 2011-08-23 MED ORDER — KETOROLAC TROMETHAMINE 15 MG/ML IJ SOLN: 15.0000 mg | Freq: Once | INTRAMUSCULAR | Status: DC

## 2011-08-23 MED ORDER — AMOXICILLIN-POT CLAVULANATE 875-125 MG PO TABS: 1.0000 | ORAL_TABLET | Freq: Two times a day (BID) | ORAL | Status: AC

## 2011-08-23 NOTE — Assessment & Plan Note (Addendum)
Toradol 15 mg IM now. Increase rest and hydration, given Promethazine and Fioricet to use prn. Consider Triptans if no response

## 2011-08-23 NOTE — Patient Instructions (Signed)

## 2011-08-23 NOTE — Assessment & Plan Note (Signed)
Started on antibiotics and encouraged to increase rest and fluids. Patient's daughter was just diagnosed with strep throat and is responding to antibiotics

## 2011-08-23 NOTE — Progress Notes (Signed)
Patient ID: Kristina Watts, female   DOB: 01-25-1971, 41 y.o.   MRN: [id number] [facility]MIN[facility] GAM[facility] [id number] 1971/01/11 08/23/2011      Progress Note-Follow Up  Subjective  Chief Complaint  Chief Complaint  Patient presents with  . congestion    X 1 day  . Otitis Media    X 1 day    HPI  Patient is a 41 yo Caucasian female with a 1-2 day history of fevers, HA, congestion, ear pain, sore throat, malaise, anorexia. No runny nose, cough, cp, palp, sob, vomiting. Patient with a h/o migraine. Has trouble about once a month. Has been struggling with phonophobia, photophobia, nausea since this morning. Took 2 Advil this am and got only minor relief. Daughter has just been diagnosed with strep throat. Did have some diarrhea 2 days ago but that has resolved.  Past Medical History  Diagnosis Date  . Chicken pox as a child  . Pneumonia 1998  . Allergy     seasonal  . Asthma   . Depression   . Recurrent cold sores   . Depression with anxiety 04/18/2011  . 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 Overweight 04/19/2011  . GDM (gestational diabetes mellitus) 04/19/2011  . History of pancreatitis 04/19/2011  . Allergic state 04/19/2011  . Preventative health care 04/19/2011  . Asthma 04/19/2011  . Tinea corporis 05/18/2011  . Fatigue 05/30/2011  . Otitis media of right ear 05/30/2011  . Dermatitis 05/18/2011  . Pharyngitis 08/23/2011  . Migraine 08/23/2011    Past Surgical History  Procedure Date  . Appendectomy 2007  . Cholecystectomy 2000  . Cesarean section 1-02 and 4-04    Family History  Problem Relation Age of Onset  . Thyroid disease Mother   . Hypertension Father   . Hyperlipidemia Father   . Heart disease Father   . Diabetes Father   . COPD Father     previous smoker  . Stroke Father   . Depression Father   . Restless legs syndrome Father   . Diabetes Brother   . Cancer Maternal Grandmother     Head and Neck  . Heart disease Paternal Grandmother   .  Hyperlipidemia Paternal Grandmother   . Hypertension Paternal Grandmother   . Diabetes Paternal Grandmother   . Depression Paternal Grandmother   . Stroke Paternal Grandmother   . Restless legs syndrome Paternal Grandmother   . Heart disease Paternal Grandfather   . Hyperlipidemia Paternal Grandfather   . Hypertension Paternal Grandfather   . Stroke Paternal Grandfather   . Diabetes Paternal Grandfather     History   Social History  . Marital Status: Married    Spouse Name: N/A    Number of Children: N/A  . Years of Education: N/A   Occupational History  . Not on file.   Social History Main Topics  . Smoking status: Never Smoker   . Smokeless tobacco: Never Used  . Alcohol Use: Yes     rarely  . Drug Use: No  . Sexually Active: Yes -- Female partner(s)   Other Topics Concern  . Not on file   Social History Narrative  . No narrative on file    Current Outpatient Prescriptions on File Prior to Visit  Medication Sig Dispense Refill  . albuterol (PROVENTIL HFA;VENTOLIN HFA) 108 (90 BASE) MCG/ACT inhaler Inhale 2 puffs into the lungs as needed.        . ALPRAZolam (XANAX) 0.25 MG tablet Take 1 tablet (0.25  mg total) by mouth 3 (three) times daily as needed for anxiety.  30 tablet  1  . budesonide-formoterol (SYMBICORT) 160-4.5 MCG/ACT inhaler Inhale 2 puffs into the lungs 2 (two) times daily as needed.        . cetirizine (ZYRTEC) 10 MG tablet Take 10 mg by mouth daily.        . citalopram (CELEXA) 10 MG tablet Take 1 tablet (10 mg total) by mouth daily.  90 tablet  1  . clotrimazole (LOTRIMIN) 1 % cream Apply topically 2 (two) times daily.  45 g  1  . triamcinolone cream (KENALOG) 0.1 % Apply topically 2 (two) times daily.  85.2 g  1  . valACYclovir (VALTREX) 1000 MG tablet Take 1,000 mg by mouth 2 (two) times daily.         No current facility-administered medications on file prior to visit.    Allergies  Allergen Reactions  . Actifed (Triprolidine-Pse) Hives     Review of Systems  Review of Systems  Constitutional: Positive for fever and malaise/fatigue.  HENT: Positive for ear pain, congestion and sore throat.   Eyes: Positive for photophobia. Negative for discharge.  Respiratory: Negative for shortness of breath.   Cardiovascular: Negative for chest pain, palpitations and leg swelling.  Gastrointestinal: Negative for nausea, abdominal pain and diarrhea.  Genitourinary: Negative for dysuria.  Musculoskeletal: Negative for falls.  Skin: Negative for rash.  Neurological: Positive for headaches. Negative for loss of consciousness.  Endo/Heme/Allergies: Negative for polydipsia.  Psychiatric/Behavioral: Negative for depression and suicidal ideas. The patient is not nervous/anxious and does not have insomnia.     Objective  BP 115/78  Pulse 75  Temp(Src) 98.4 F (36.9 C) (Temporal)  Ht 5\' 5"  (1.651 m)  Wt 196 lb 12.8 oz (89.268 kg)  BMI 32.75 kg/m2  SpO2 98%  LMP 08/19/2011  Physical Exam  Physical Exam  Constitutional: She is oriented to person, place, and time and well-developed, well-nourished, and in no distress. No distress.  HENT:  Head: Normocephalic and atraumatic.       Oropharynx erythematous 2/4 left tonsil. 1/4 right tonsil  Eyes: Conjunctivae are normal.  Neck: Neck supple. No thyromegaly present.  Cardiovascular: Normal rate, regular rhythm and normal heart sounds.   No murmur heard. Pulmonary/Chest: Effort normal and breath sounds normal. She has no wheezes.  Abdominal: She exhibits no distension and no mass.  Musculoskeletal: She exhibits no edema.  Lymphadenopathy:    She has cervical adenopathy.  Neurological: She is alert and oriented to person, place, and time.  Skin: Skin is warm and dry. No rash noted. She is not diaphoretic.  Psychiatric: Memory, affect and judgment normal.    Lab Results  Component Value Date   TSH 0.98 05/16/2011   Lab Results  Component Value Date   WBC 6.0 05/16/2011   HGB  13.3 05/16/2011   HCT 38.8 05/16/2011   MCV 92.7 05/16/2011   PLT 242.0 05/16/2011   Lab Results  Component Value Date   CREATININE 0.8 05/16/2011   BUN 10 05/16/2011   NA 140 05/16/2011   K 4.4 05/16/2011   CL 105 05/16/2011   CO2 28 05/16/2011   Lab Results  Component Value Date   ALT 16 05/16/2011   AST 17 05/16/2011   ALKPHOS 57 05/16/2011   BILITOT 0.5 05/16/2011   Lab Results  Component Value Date   CHOL 166 05/16/2011   Lab Results  Component Value Date   HDL 52.90 05/16/2011  Lab Results  Component Value Date   LDLCALC 101* 05/16/2011   Lab Results  Component Value Date   TRIG 63.0 05/16/2011   Lab Results  Component Value Date   CHOLHDL 3 05/16/2011     Assessment & Plan  Migraine Toradol 15 mg IM now. Increase rest and hydration, given Promethazine and Fioricet to use prn. Consider Triptans if no response  Pharyngitis Started on antibiotics and encouraged to increase rest and fluids. Patient's daughter was just diagnosed with strep throat and is responding to antibiotics

## 2011-10-11 ENCOUNTER — Other Ambulatory Visit: Payer: Self-pay | Admitting: Family Medicine

## 2011-10-11 DIAGNOSIS — B354 Tinea corporis: Secondary | ICD-10-CM

## 2011-10-12 ENCOUNTER — Other Ambulatory Visit: Payer: Self-pay

## 2011-10-12 MED ORDER — CLOTRIMAZOLE 1 % EX CREA: TOPICAL_CREAM | Freq: Two times a day (BID) | CUTANEOUS | Status: DC

## 2011-10-12 MED ORDER — VALACYCLOVIR HCL 1 G PO TABS: 1000.0000 mg | ORAL_TABLET | Freq: Two times a day (BID) | ORAL | Status: DC

## 2011-10-12 NOTE — Telephone Encounter (Signed)
This is a cream for her viral ulcers, she can have an rx for it as requested with 1 refill

## 2011-10-12 NOTE — Telephone Encounter (Signed)
We received a request for Denavir 1% cream 1.5 gm one and five tenth Apply a small amount topically to the sore every 2 hours while awake (use for 4 days only)?   I don't see this on med list? Please advise

## 2011-10-12 NOTE — Telephone Encounter (Signed)
RX's sent to pharmacy 

## 2011-10-13 MED ORDER — PENCICLOVIR 1 % EX CREA: TOPICAL_CREAM | CUTANEOUS | Status: DC

## 2011-11-18 ENCOUNTER — Ambulatory Visit: Payer: Managed Care, Other (non HMO) | Admitting: Family Medicine

## 2011-11-21 ENCOUNTER — Encounter: Payer: Self-pay | Admitting: Family Medicine

## 2011-11-21 ENCOUNTER — Ambulatory Visit (INDEPENDENT_AMBULATORY_CARE_PROVIDER_SITE_OTHER): Payer: Managed Care, Other (non HMO) | Admitting: Family Medicine

## 2011-11-21 VITALS — BP 116/79 | HR 73 | Temp 98.1°F | Ht 65.0 in | Wt 204.4 lb

## 2011-11-21 DIAGNOSIS — J45909 Unspecified asthma, uncomplicated: Secondary | ICD-10-CM

## 2011-11-21 DIAGNOSIS — T7840XA Allergy, unspecified, initial encounter: Secondary | ICD-10-CM

## 2011-11-21 DIAGNOSIS — F341 Dysthymic disorder: Secondary | ICD-10-CM

## 2011-11-21 DIAGNOSIS — E663 Overweight: Secondary | ICD-10-CM

## 2011-11-21 DIAGNOSIS — F418 Other specified anxiety disorders: Secondary | ICD-10-CM

## 2011-11-21 MED ORDER — PHENTERMINE HCL 15 MG PO CAPS: 15.0000 mg | ORAL_CAPSULE | ORAL | Status: DC

## 2011-11-21 MED ORDER — CITALOPRAM HYDROBROMIDE 10 MG PO TABS: 10.0000 mg | ORAL_TABLET | Freq: Two times a day (BID) | ORAL | Status: DC

## 2011-11-21 MED ORDER — MONTELUKAST SODIUM 10 MG PO TABS: 10.0000 mg | ORAL_TABLET | Freq: Every day | ORAL | Status: DC

## 2011-11-21 NOTE — Progress Notes (Signed)
Patient ID: Kristina Watts, female   DOB: 1970-07-31, 41 y.o.   MRN: [id number] Kristina Watts [id number] 04-Oct-1970 11/21/2011      Progress Note-Follow Up  Subjective  Chief Complaint  Chief Complaint  Patient presents with  . Follow-up    HPI  Patient is a 41 year old Caucasian female who is in today for followup. She notes that she has been struggling with worsening. She has for allergies. She notes nasal congestion itching. Postnasal drip. Fatigue. Also notes increased wheezing most notably in the evenings. Is forgetting to use her Symbicort in the morning but uses it in the evenings due to the wheezing. No chest pain, palpitations, shortness of breath, GI GU complaints at this time  Past Medical History  Diagnosis Date  . Chicken pox as a child  . Pneumonia 1998  . Allergy     seasonal  . Asthma   . Depression   . Recurrent cold sores   . Depression with anxiety 04/18/2011  . 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 Overweight 04/19/2011  . GDM (gestational diabetes mellitus) 04/19/2011  . History of pancreatitis 04/19/2011  . Allergic state 04/19/2011  . Preventative health care 04/19/2011  . Asthma 04/19/2011  . Tinea corporis 05/18/2011  . Fatigue 05/30/2011  . Otitis media of right ear 05/30/2011  . Dermatitis 05/18/2011  . Pharyngitis 08/23/2011  . Migraine 08/23/2011    Past Surgical History  Procedure Date  . Appendectomy 2007  . Cholecystectomy 2000  . Cesarean section 1-02 and 4-04    Family History  Problem Relation Age of Onset  . Thyroid disease Mother   . Hypertension Father   . Hyperlipidemia Father   . Heart disease Father   . Diabetes Father   . COPD Father     previous smoker  . Stroke Father   . Depression Father   . Restless legs syndrome Father   . Diabetes Brother   . Cancer Maternal Grandmother     Head and Neck  . Heart disease Paternal Grandmother   . Hyperlipidemia Paternal Grandmother   . Hypertension Paternal  Grandmother   . Diabetes Paternal Grandmother   . Depression Paternal Grandmother   . Stroke Paternal Grandmother   . Restless legs syndrome Paternal Grandmother   . Heart disease Paternal Grandfather   . Hyperlipidemia Paternal Grandfather   . Hypertension Paternal Grandfather   . Stroke Paternal Grandfather   . Diabetes Paternal Grandfather     History   Social History  . Marital Status: Married    Spouse Name: N/A    Number of Children: N/A  . Years of Education: N/A   Occupational History  . Not on file.   Social History Main Topics  . Smoking status: Never Smoker   . Smokeless tobacco: Never Used  . Alcohol Use: Yes     rarely  . Drug Use: No  . Sexually Active: Yes -- Female partner(s)   Other Topics Concern  . Not on file   Social History Narrative  . No narrative on file    Current Outpatient Prescriptions on File Prior to Visit  Medication Sig Dispense Refill  . albuterol (PROVENTIL HFA;VENTOLIN HFA) 108 (90 BASE) MCG/ACT inhaler Inhale 2 puffs into the lungs as needed.        . ALPRAZolam (XANAX) 0.25 MG tablet Take 1 tablet (0.25 mg total) by mouth 3 (three) times daily as needed for anxiety.  30 tablet  1  . budesonide-formoterol (SYMBICORT) 160-4.5  MCG/ACT inhaler Inhale 2 puffs into the lungs 2 (two) times daily as needed.        . Butalbital-APAP-Caffeine 50-300-40 MG CAPS Take 1 capsule by mouth 3 (three) times daily as needed. HA  84 capsule  1  . cetirizine (ZYRTEC) 10 MG tablet Take 10 mg by mouth daily.        . fexofenadine (ALLEGRA) 30 MG tablet Take 30 mg by mouth daily.      Marland Kitchen guaiFENesin (MUCINEX) 600 MG 12 hr tablet Take 1,200 mg by mouth 2 (two) times daily.      Marland Kitchen ibuprofen (ADVIL,MOTRIN) 200 MG tablet Take 200 mg by mouth every 6 (six) hours as needed.      . penciclovir (DENAVIR) 1 % cream Apply topically every 2 (two) hours.  1.5 g  1  . DISCONTD: citalopram (CELEXA) 10 MG tablet Take 1 tablet (10 mg total) by mouth daily.  90 tablet  1    . clotrimazole (LOTRIMIN) 1 % cream Apply topically 2 (two) times daily.  45 g  1  . montelukast (SINGULAIR) 10 MG tablet Take 1 tablet (10 mg total) by mouth at bedtime.  30 tablet  3  . phentermine 15 MG capsule Take 1 capsule (15 mg total) by mouth every morning.  30 capsule  0  . valACYclovir (VALTREX) 1000 MG tablet Take 1 tablet (1,000 mg total) by mouth 2 (two) times daily.  60 tablet  1    Allergies  Allergen Reactions  . Actifed (Triprolidine-Pse) Hives    Review of Systems  Review of Systems  Constitutional: Negative for fever and malaise/fatigue.  HENT: Positive for hearing loss and congestion.   Eyes: Negative for discharge.  Respiratory: Positive for cough and wheezing. Negative for shortness of breath.   Cardiovascular: Negative for chest pain, palpitations and leg swelling.  Gastrointestinal: Negative for nausea, abdominal pain and diarrhea.  Genitourinary: Negative for dysuria.  Musculoskeletal: Negative for falls.  Skin: Negative for rash.  Neurological: Negative for loss of consciousness and headaches.  Endo/Heme/Allergies: Negative for polydipsia.  Psychiatric/Behavioral: Negative for depression and suicidal ideas. The patient is not nervous/anxious and does not have insomnia.     Objective  BP 116/79  Pulse 73  Temp(Src) 98.1 F (36.7 C) (Temporal)  Ht 5\' 5"  (1.651 m)  Wt 204 lb 6.4 oz (92.715 kg)  BMI 34.01 kg/m2  SpO2 94%  LMP 11/07/2011  Physical Exam  Physical Exam  Constitutional: She is oriented to person, place, and time and well-developed, well-nourished, and in no distress. No distress.  HENT:  Head: Normocephalic and atraumatic.  Eyes: Conjunctivae are normal.  Neck: Neck supple. No thyromegaly present.  Cardiovascular: Normal rate, regular rhythm and normal heart sounds.   No murmur heard. Pulmonary/Chest: Effort normal. She has wheezes.  Abdominal: She exhibits no distension and no mass.  Musculoskeletal: She exhibits no edema.   Lymphadenopathy:    She has no cervical adenopathy.  Neurological: She is alert and oriented to person, place, and time.  Skin: Skin is warm and dry. No rash noted. She is not diaphoretic.  Psychiatric: Memory, affect and judgment normal.    Lab Results  Component Value Date   TSH 0.98 05/16/2011   Lab Results  Component Value Date   WBC 6.0 05/16/2011   HGB 13.3 05/16/2011   HCT 38.8 05/16/2011   MCV 92.7 05/16/2011   PLT 242.0 05/16/2011   Lab Results  Component Value Date   CREATININE 0.8 05/16/2011  BUN 10 05/16/2011   NA 140 05/16/2011   K 4.4 05/16/2011   CL 105 05/16/2011   CO2 28 05/16/2011   Lab Results  Component Value Date   ALT 16 05/16/2011   AST 17 05/16/2011   ALKPHOS 57 05/16/2011   BILITOT 0.5 05/16/2011   Lab Results  Component Value Date   CHOL 166 05/16/2011   Lab Results  Component Value Date   HDL 52.90 05/16/2011   Lab Results  Component Value Date   LDLCALC 101* 05/16/2011   Lab Results  Component Value Date   TRIG 63.0 05/16/2011   Lab Results  Component Value Date   CHOLHDL 3 05/16/2011     Assessment & Plan  Allergic state Add Singulair, may continue antihistamines bid and call if no improvement  Asthma She has not been using her am dose of Symbicort and has recently had mor trouble with her asthma, enocuraged bid dosing and Albuterol prn, add Singulair  Overweight Recent weight gain, encouraged DASH diet increased exercise and started on Phentermine 15 mg daily, return in 3 weeks for reevaluation  Depression with anxiety Tolerating Citalopram at 10 mg and most days she feels it is enough. Some days with increased stressors (her parents are moving to the area) she takes 2. She is encouraged to increase the Citalopram to 20 mg daily as tolerated

## 2011-11-21 NOTE — Patient Instructions (Signed)
Calorie Counting Diet  A calorie counting diet requires you to eat the number of calories that are right for you in a day. Calories are the measurement of how much energy you get from the food you eat. Eating the right amount of calories is important for staying at a healthy weight. If you eat too many calories, your body will store them as fat and you may gain weight. If you eat too few calories, you may lose weight. Counting the number of calories you eat during a day will help you know if you are eating the right amount. A Registered Dietitian can determine how many calories you need in a day. The amount of calories needed varies from person to person.  If your goal is to lose weight, you will need to eat fewer calories. Losing weight can benefit you if you are overweight or have health problems such as heart disease, high blood pressure, or diabetes. If your goal is to gain weight, you will need to eat more calories. Gaining weight may be necessary if you have a certain health problem that causes your body to need more energy.  TIPS  Whether you are increasing or decreasing the number of calories you eat during a day, it may be hard to get used to changes in what you eat and drink. The following are tips to help you keep track of the number of calories you eat.   Measure foods at home with measuring cups. This helps you know the amount of food and number of calories you are eating.    Restaurants often serve food in amounts that are larger than 1 serving. While eating out, estimate how many servings of a food you are given. For example, a serving of cooked rice is  cup or about the size of half of a fist. Knowing serving sizes will help you be aware of how much food you are eating at restaurants.    Ask for smaller portion sizes or child-size portions at restaurants.    Plan to eat half of a meal at a restaurant. Take the rest home or share the other half with a friend.    Read the Nutrition Facts panel on  food labels for calorie content and serving size. You can find out how many servings are in a package, the size of a serving, and the number of calories each serving has.    For example, a package might contain 3 cookies. The Nutrition Facts panel on that package says that 1 serving is 1 cookie. Below that, it will say there are 3 servings in the container. The calories section of the Nutrition Facts label says there are 90 calories. This means there are 90 calories in 1 cookie (1 serving). If you eat 1 cookie you have eaten 90 calories. If you eat all 3 cookies, you have eaten 270 calories (3 servings x 90 calories = 270 calories).   The list below tells you how big or small some common portion sizes are.   1 oz.........4 stacked dice.    3 oz.........Deck of cards.    1 tsp........Tip of little finger.    1 tbs........Thumb.    2 tbs........Golf ball.     cup.......Half of a fist.    1 cup........A fist.   KEEP A FOOD LOG  Write down every food item you eat, the amount you eat, and the number of calories in each food you eat during the day. At the end of   the day, you can add up the total number of calories you have eaten. It may help to keep a list like the one below. Find out the calorie information by reading the Nutrition Facts panel on food labels.  Breakfast   Bran cereal (1 cup, 110 calories).    Fat-free milk ( cup, 45 calories).   Snack   Apple (1 medium, 80 calories).   Lunch   Spinach (1 cup, 20 calories).    Tomato ( medium, 20 calories).    Chicken breast strips (3 oz, 165 calories).    Shredded cheddar cheese ( cup, 110 calories).    Light Italian dressing (2 tbs, 60 calories).    Whole-wheat bread (1 slice, 80 calories).    Tub margarine (1 tsp, 35 calories).    Vegetable soup (1 cup, 160 calories).   Dinner   Pork chop (3 oz, 190 calories).    Brown rice (1 cup, 215 calories).    Steamed broccoli ( cup, 20 calories).    Strawberries (1  cup, 65 calories).    Whipped  cream (1 tbs, 50 calories).   Daily Calorie Total: 1425  Document Released: 06/06/2005 Document Revised: 05/26/2011 Document Reviewed: 12/01/2006  [employer] Patient Information 2012 [employer].

## 2011-11-21 NOTE — Assessment & Plan Note (Signed)
Add Singulair, may continue antihistamines bid and call if no improvement

## 2011-11-21 NOTE — Assessment & Plan Note (Signed)
Recent weight gain, encouraged DASH diet increased exercise and started on Phentermine 15 mg daily, return in 3 weeks for reevaluation

## 2011-11-21 NOTE — Assessment & Plan Note (Signed)
Tolerating Citalopram at 10 mg and most days she feels it is enough. Some days with increased stressors (her parents are moving to the area) she takes 2. She is encouraged to increase the Citalopram to 20 mg daily as tolerated

## 2011-11-21 NOTE — Assessment & Plan Note (Signed)
She has not been using her am dose of Symbicort and has recently had mor trouble with her asthma, enocuraged bid dosing and Albuterol prn, add Singulair

## 2011-11-22 ENCOUNTER — Other Ambulatory Visit: Payer: Self-pay

## 2011-11-22 MED ORDER — BUDESONIDE-FORMOTEROL FUMARATE 160-4.5 MCG/ACT IN AERO: 2.0000 | INHALATION_SPRAY | Freq: Two times a day (BID) | RESPIRATORY_TRACT | Status: DC | PRN

## 2011-12-12 ENCOUNTER — Telehealth: Payer: Self-pay | Admitting: Family Medicine

## 2011-12-12 ENCOUNTER — Encounter: Payer: Self-pay | Admitting: Family Medicine

## 2011-12-12 ENCOUNTER — Ambulatory Visit (INDEPENDENT_AMBULATORY_CARE_PROVIDER_SITE_OTHER): Payer: Managed Care, Other (non HMO) | Admitting: Family Medicine

## 2011-12-12 VITALS — BP 111/78 | HR 72 | Temp 97.6°F | Ht 65.0 in | Wt 199.1 lb

## 2011-12-12 DIAGNOSIS — R52 Pain, unspecified: Secondary | ICD-10-CM

## 2011-12-12 DIAGNOSIS — H6690 Otitis media, unspecified, unspecified ear: Secondary | ICD-10-CM

## 2011-12-12 DIAGNOSIS — K861 Other chronic pancreatitis: Secondary | ICD-10-CM

## 2011-12-12 DIAGNOSIS — R1012 Left upper quadrant pain: Secondary | ICD-10-CM

## 2011-12-12 DIAGNOSIS — E663 Overweight: Secondary | ICD-10-CM

## 2011-12-12 DIAGNOSIS — IMO0001 Reserved for inherently not codable concepts without codable children: Secondary | ICD-10-CM

## 2011-12-12 DIAGNOSIS — IMO0002 Reserved for concepts with insufficient information to code with codable children: Secondary | ICD-10-CM

## 2011-12-12 DIAGNOSIS — H6692 Otitis media, unspecified, left ear: Secondary | ICD-10-CM

## 2011-12-12 DIAGNOSIS — K219 Gastro-esophageal reflux disease without esophagitis: Secondary | ICD-10-CM | POA: Insufficient documentation

## 2011-12-12 DIAGNOSIS — K859 Acute pancreatitis without necrosis or infection, unspecified: Secondary | ICD-10-CM

## 2011-12-12 DIAGNOSIS — Z8719 Personal history of other diseases of the digestive system: Secondary | ICD-10-CM

## 2011-12-12 HISTORY — DX: Acute pancreatitis without necrosis or infection, unspecified: K85.90

## 2011-12-12 HISTORY — DX: Reserved for inherently not codable concepts without codable children: IMO0001

## 2011-12-12 HISTORY — DX: Reserved for concepts with insufficient information to code with codable children: IMO0002

## 2011-12-12 HISTORY — DX: Left upper quadrant pain: R10.12

## 2011-12-12 LAB — RENAL FUNCTION PANEL
BUN: 9 mg/dL (ref 6–23)
CO2: 29 mEq/L (ref 19–32)
Calcium: 8.9 mg/dL (ref 8.4–10.5)
Creatinine, Ser: 0.7 mg/dL (ref 0.4–1.2)
GFR: 104.29 mL/min (ref >60.00)
Glucose, Bld: 97 mg/dL (ref 70–99)

## 2011-12-12 LAB — CBC
HCT: 40.3 % (ref 36.0–46.0)
MCHC: 34.1 g/dL (ref 30.0–36.0)
MCV: 91.4 fl (ref 78.0–100.0)
RBC: 4.41 Mil/uL (ref 3.87–5.11)

## 2011-12-12 LAB — SEDIMENTATION RATE: Sed Rate: 13 mm/hr (ref 0–22)

## 2011-12-12 LAB — AMYLASE: Amylase: 76 U/L (ref 27–131)

## 2011-12-12 MED ORDER — AMOXICILLIN 500 MG PO CAPS
1000.0000 mg | ORAL_CAPSULE | Freq: Two times a day (BID) | ORAL | Status: AC
Start: 2011-12-12 — End: 2011-12-22

## 2011-12-12 MED ORDER — RANITIDINE HCL 300 MG PO TABS: 300.0000 mg | ORAL_TABLET | Freq: Every day | ORAL | Status: DC

## 2011-12-12 MED ORDER — PHENTERMINE HCL 15 MG PO CAPS: 15.0000 mg | ORAL_CAPSULE | ORAL | Status: DC

## 2011-12-12 NOTE — Telephone Encounter (Signed)
Pt will be here in 10 min.

## 2011-12-12 NOTE — Assessment & Plan Note (Signed)
First episode was years ago and severe with cholecystectomy and enzyme levels in the 500s. Now her pain is significantly less intense and has improved some with a clear liquid diet in the past  12 hours. Will check amylase and lipase levels to further evaluate.

## 2011-12-12 NOTE — Telephone Encounter (Signed)
LMP: 11/28/11 Kristina Watts ) is calling about starting with constant  R sided upper abdominal pain  on 12/11/11. She just returned from the beach where she ate fish daily and drank some wine ( which she doesnt normallly do)  She is wondering if she needs to come in and have enzymes checked to r/o Pancreatitis.  Area is tender to touch. Afebrile. Last bm today -12/12/11 normal. She has not taken RX meds today- but took Gas x, Ibuprofen, and Prilosec. Pain level is now 2-3 out of 10 but last night stomach felt knotted and was 5-6. Triage and care advice per Abdominal Pain Protocol and appnt scheduled for 1330 -12/12/11.

## 2011-12-12 NOTE — Assessment & Plan Note (Signed)
Amoxicillin 1000mg  bid

## 2011-12-12 NOTE — Assessment & Plan Note (Signed)
Initially c/o ruq and epigastric pain but on palpation the pain was in the luq, check an H pylori and maintain a clear liquid diet for 24 hours then proceed to [employer] as tolerated.

## 2011-12-12 NOTE — Patient Instructions (Addendum)
Helicobacter Pylori and Ulcer Disease An ulcer may be in your stomach (gastric ulcer) or in the first part of your small bowel, which is called the duodenum (duodenal ulcer). An ulcer is a break in the stomach or duodenum lining. The break wears down into the deeper tissue. Helicobacter pylori (H. pylori) is a type of germ (bacteria) that may cause the majority of gastric or duodenal ulcers. CAUSES   A germ (bacterium). H. pylori can weaken the protective mucous coating of the stomach and duodenum. This allows acid to get through to the sensitive lining of the stomach or duodenum and an ulcer can then form.   Certain medications.   Using substances that can bother the lining of the stomach (alcohol, tobacco or medications such as Advil or Motrin) in the presence of H.pylori infection. This can increase the chances of getting an ulcer.   Cancer (rarely).  Most people infected with H. pylori do not get ulcers. It is not known how people catch H. pylori. It may be through food or water. H. pylori has been found in the saliva of some infected people. Therefore, the bacteria may also spread through mouth-to-mouth contact such as kissing. SYMPTOMS  The problems (symptoms) of ulcer disease are usually:  A burning or gnawing of the mid-upper belly (abdomen). This is often worse on an empty stomach. It may get better with food. This may be associated with feeling sick to your stomach (nausea), bloating and vomiting.   If the ulcer results in bleeding, it can cause:   Black, tarry stools.   Throwing up bright red blood.   Throwing up coffee ground looking materials.  With severe bleeding, there may be loss of consciousness and shock. Besides ulcer disease, H. pylori can also cause chronic gastritis (irritation of the lining of the stomach without ulcer) or stomach acid-type discomfort. You may not have symptoms even though you have an H. pylori infection. Although this is an infection, you may not  have usual infection symptoms (such as fever). DIAGNOSIS  Ulcer disease can be diagnosed in many different ways. If you have an ulcer, it is important to know whether or not it is caused by H. Pylori. Treatment for an ulcer caused by H. pylori is different from that for an ulcer with other causes. The best way to detect H. pylori is taking tissue directly from the ulcer during an endoscopy test.   An endoscopy is an exam that uses an endoscope. This is a thin, lighted tube with a small camera on the end. It is like a flexible telescope. The patient is given a drug to make them calm (sedative). The caregiver eases the endoscope into the mouth and down the throat to the stomach and duodenum. This allows the doctor to see the lining of the esophagus, stomach and duodenum.   If an endoscopy is not needed, then H. pylori can be detected with tests of the blood, stool or even breath.  TREATMENT   H. pylori peptic ulcer treatment usually involves a combination of:   Medicines that kill germs (antibiotics).   Acid suppressors.   Stomach protectors.   The use of only one medication to treat H. pylori is not recommended. The most proven treatment is a 2 week course of treatment called triple therapy. It involves taking two antibiotics to kill the bacteria and either an acid suppressor or stomach-lining shield. Two-week triple therapy reduces ulcer symptoms, kills the bacteria, and prevents ulcers from coming back in many   patients.   Unfortunately, patients may find triple therapy hard to do. This is because it involves taking as many as 20 pills a day. Also, the antibiotics used in triple therapy may cause mild side effects. These include nausea, vomiting, diarrhea, dark stools, a metallic taste in the mouth, dizziness, headache and yeast infections in women. Talk to your caregiver if you have any of these side effects.  HOME CARE INSTRUCTIONS   Take your medications as directed and for as long as  prescribed. Contact your caregiver if you have problems or side effects from your medications.   Continue regular work and usual activities unless told otherwise by your caregiver.   Avoid tobacco, alcohol and caffeine. Tobacco use will decrease and slow healing.   Avoid medications that are harmful. This includes aspirin and NSAIDS such as ibuprofen and naproxen.   Avoid foods that seem to aggravate or cause discomfort.   There are many over-the-counter products available to control stomach acid and other symptoms. Discuss these with your caregiver before using them. Do not  stop taking prescription medications for over-the-counter medications without talking with your caregiver.   Special diets are not usually needed.   Keep any follow-up appointments and blood tests as directed.  SEEK MEDICAL CARE IF:   Your pain or other ulcer symptoms do not improve within a few days of starting treatment.   You develop diarrhea. This can be a problem related to certain treatments.   You have ongoing indigestion or heartburn even if your main ulcer symptoms are improved.   You think you have any side effects from your medications or if you do not understand how to use your medications right.  SEEK IMMEDIATE MEDICAL CARE IF:  Any of the following happen:  You develop bright red, rectal bleeding.   You develop dark black, tarry stools.   You throw up (vomit) blood.   You become light-headed, weak, have fainting episodes, or become sweaty, cold and clammy.   You have severe abdominal pain not controlled by medications. Do not take pain medications unless ordered by your caregiver.  MAKE SURE YOU:   Understand these instructions.   Will watch your condition.   Will get help right away if you are not doing well or get worse.  Document Released: 08/27/2003 Document Revised: 05/26/2011 Document Reviewed: 01/24/2008 [facility] Patient Information 2012 [place], [place].  Clear fluids for next  24 hours and then BRAT diet with bananas, rice, applesauce toast

## 2011-12-12 NOTE — Assessment & Plan Note (Signed)
Started on Ranitidine 300mg  qhs

## 2011-12-12 NOTE — Progress Notes (Signed)
Patient ID: Kristina Watts, female   DOB: 09-01-1970, 41 y.o.   MRN: [id number] Kristina Watts [id number] 09/05/1970 12/12/2011      Progress Note-Follow Up  Subjective  Chief Complaint  Chief Complaint  Patient presents with  . Abdominal Pain    upper Right side X 2 days    HPI  Patient is a 41 year old Caucasian female who is in today for the evaluation of multiple concerns. She is noting some left ear pain and some mild itching as well as soreness down her left neck for about 3 days now. She started Mucinex and thought was helpful. She has been at each but denies any swimming. She's had no fevers or chills. She denies nasal congestion or sore throat. Has no cough, chest pain. She is complaining also of abdominal pain. Initially she says right upper quadrant but on further questioning is midepigastric and left upper quadrant discomfort as well. She's been eating or rich food and drinking more alcohol than usual. Has long history of recurrent pancreatitis. Has been struggling with nausea and increased heartburn but no vomiting or diarrhea. No constipation bloody or tarry stool are noted. She has maintained a clear liquid diet for the last 12 hours and that has brought her pain down from roughly 5/10 to a 2-3/10  Past Medical History  Diagnosis Date  . Chicken pox as a child  . Pneumonia 1998  . Allergy     seasonal  . Asthma   . Depression   . Recurrent cold sores   . Depression with anxiety 04/18/2011  . 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 Overweight 04/19/2011  . GDM (gestational diabetes mellitus) 04/19/2011  . History of pancreatitis 04/19/2011  . Allergic state 04/19/2011  . Preventative health care 04/19/2011  . Asthma 04/19/2011  . Tinea corporis 05/18/2011  . Fatigue 05/30/2011  . Otitis media of right ear 05/30/2011  . Dermatitis 05/18/2011  . Pharyngitis 08/23/2011  . Migraine 08/23/2011  . Pancreatitis, recurrent 12/12/2011  . Reflux 12/12/2011  . LUQ  pain 12/12/2011    Past Surgical History  Procedure Date  . Appendectomy 2007  . Cholecystectomy 2000  . Cesarean section 1-02 and 4-04    Family History  Problem Relation Age of Onset  . Thyroid disease Mother   . Hypertension Father   . Hyperlipidemia Father   . Heart disease Father   . Diabetes Father   . COPD Father     previous smoker  . Stroke Father   . Depression Father   . Restless legs syndrome Father   . Diabetes Brother   . Cancer Maternal Grandmother     Head and Neck  . Heart disease Paternal Grandmother   . Hyperlipidemia Paternal Grandmother   . Hypertension Paternal Grandmother   . Diabetes Paternal Grandmother   . Depression Paternal Grandmother   . Stroke Paternal Grandmother   . Restless legs syndrome Paternal Grandmother   . Heart disease Paternal Grandfather   . Hyperlipidemia Paternal Grandfather   . Hypertension Paternal Grandfather   . Stroke Paternal Grandfather   . Diabetes Paternal Grandfather     History   Social History  . Marital Status: Married    Spouse Name: N/A    Number of Children: N/A  . Years of Education: N/A   Occupational History  . Not on file.   Social History Main Topics  . Smoking status: Never Smoker   . Smokeless tobacco: Never Used  . Alcohol Use:  Yes     rarely  . Drug Use: No  . Sexually Active: Yes -- Female partner(s)   Other Topics Concern  . Not on file   Social History Narrative  . No narrative on file    Current Outpatient Prescriptions on File Prior to Visit  Medication Sig Dispense Refill  . albuterol (PROVENTIL HFA;VENTOLIN HFA) 108 (90 BASE) MCG/ACT inhaler Inhale 2 puffs into the lungs as needed.        . ALPRAZolam (XANAX) 0.25 MG tablet Take 1 tablet (0.25 mg total) by mouth 3 (three) times daily as needed for anxiety.  30 tablet  1  . budesonide-formoterol (SYMBICORT) 160-4.5 MCG/ACT inhaler Inhale 2 puffs into the lungs 2 (two) times daily as needed.  1 Inhaler  4  . cetirizine  (ZYRTEC) 10 MG tablet Take 10 mg by mouth daily.        . citalopram (CELEXA) 10 MG tablet Take 1 tablet (10 mg total) by mouth 2 (two) times daily.  180 tablet  1  . guaiFENesin (MUCINEX) 600 MG 12 hr tablet Take 1,200 mg by mouth 2 (two) times daily.      Marland Kitchen ibuprofen (ADVIL,MOTRIN) 200 MG tablet Take 200 mg by mouth every 6 (six) hours as needed.      . montelukast (SINGULAIR) 10 MG tablet Take 1 tablet (10 mg total) by mouth at bedtime.  30 tablet  3  . omeprazole (PRILOSEC OTC) 20 MG tablet Take 20 mg by mouth as needed.      Marland Kitchen DISCONTD: phentermine 15 MG capsule Take 1 capsule (15 mg total) by mouth every morning.  30 capsule  0  . Butalbital-APAP-Caffeine 50-300-40 MG CAPS Take 1 capsule by mouth 3 (three) times daily as needed. HA  84 capsule  1  . penciclovir (DENAVIR) 1 % cream Apply topically every 2 (two) hours.  1.5 g  1  . ranitidine (ZANTAC) 300 MG tablet Take 1 tablet (300 mg total) by mouth at bedtime.  30 tablet  5  . valACYclovir (VALTREX) 1000 MG tablet Take 1 tablet (1,000 mg total) by mouth 2 (two) times daily.  60 tablet  1    Allergies  Allergen Reactions  . Actifed (Triprolidine-Pse) Hives    Review of Systems  Review of Systems  Constitutional: Negative for fever, chills and malaise/fatigue.  HENT: Positive for ear pain and neck pain. Negative for congestion.        Left ear painful and itchy for several days  Eyes: Negative for discharge.  Respiratory: Negative for shortness of breath.   Cardiovascular: Negative for chest pain, palpitations and leg swelling.  Gastrointestinal: Positive for heartburn, nausea and abdominal pain. Negative for vomiting, diarrhea, constipation, blood in stool and melena.       Luq and epigastric pain  Genitourinary: Negative for dysuria.  Musculoskeletal: Negative for falls.       Left side of neck below ear achy  Skin: Negative for rash.  Neurological: Negative for loss of consciousness and headaches.  Endo/Heme/Allergies:  Negative for polydipsia.  Psychiatric/Behavioral: Negative for depression and suicidal ideas. The patient is not nervous/anxious and does not have insomnia.     Objective  BP 111/78  Pulse 72  Temp 97.6 F (36.4 C) (Temporal)  Ht 5\' 5"  (1.651 m)  Wt 199 lb 1.9 oz (90.32 kg)  BMI 33.14 kg/m2  SpO2 98%  LMP 12/03/2011  Physical Exam  Physical Exam  Constitutional: She is oriented to person, place, and time and  well-developed, well-nourished, and in no distress. No distress.  HENT:  Head: Normocephalic and atraumatic.  Eyes: Conjunctivae are normal.  Neck: Neck supple. No thyromegaly present.  Cardiovascular: Normal rate, regular rhythm and normal heart sounds.   No murmur heard. Pulmonary/Chest: Effort normal and breath sounds normal. She has no wheezes.  Abdominal: Soft. Bowel sounds are normal. She exhibits no distension and no mass. There is tenderness. There is no rebound and no guarding.       Tender to palp in epigastrium and LUQ, no HSM  Musculoskeletal: She exhibits no edema.  Lymphadenopathy:    She has no cervical adenopathy.  Neurological: She is alert and oriented to person, place, and time.  Skin: Skin is warm and dry. No rash noted. She is not diaphoretic.  Psychiatric: Memory, affect and judgment normal.    Lab Results  Component Value Date   TSH 0.98 05/16/2011   Lab Results  Component Value Date   WBC 6.0 05/16/2011   HGB 13.3 05/16/2011   HCT 38.8 05/16/2011   MCV 92.7 05/16/2011   PLT 242.0 05/16/2011   Lab Results  Component Value Date   CREATININE 0.8 05/16/2011   BUN 10 05/16/2011   NA 140 05/16/2011   K 4.4 05/16/2011   CL 105 05/16/2011   CO2 28 05/16/2011   Lab Results  Component Value Date   ALT 16 05/16/2011   AST 17 05/16/2011   ALKPHOS 57 05/16/2011   BILITOT 0.5 05/16/2011   Lab Results  Component Value Date   CHOL 166 05/16/2011   Lab Results  Component Value Date   HDL 52.90 05/16/2011   Lab Results  Component  Value Date   LDLCALC 101* 05/16/2011   Lab Results  Component Value Date   TRIG 63.0 05/16/2011   Lab Results  Component Value Date   CHOLHDL 3 05/16/2011     Assessment & Plan  History of pancreatitis First episode was years ago and severe with cholecystectomy and enzyme levels in the 500s. Now her pain is significantly less intense and has improved some with a clear liquid diet in the past  12 hours. Will check amylase and lipase levels to further evaluate.  LUQ pain Initially c/o ruq and epigastric pain but on palpation the pain was in the luq, check an H pylori and maintain a clear liquid diet for 24 hours then proceed to [employer] as tolerated.   Reflux Started on Ranitidine 300mg  qhs  Otitis media of left ear Amoxicillin 1000mg  bid

## 2011-12-16 ENCOUNTER — Ambulatory Visit: Payer: Managed Care, Other (non HMO) | Admitting: Family Medicine

## 2011-12-28 ENCOUNTER — Ambulatory Visit (INDEPENDENT_AMBULATORY_CARE_PROVIDER_SITE_OTHER): Payer: Managed Care, Other (non HMO) | Admitting: Family Medicine

## 2011-12-28 ENCOUNTER — Encounter: Payer: Self-pay | Admitting: Family Medicine

## 2011-12-28 VITALS — BP 110/78 | Temp 98.0°F | Wt 200.0 lb

## 2011-12-28 DIAGNOSIS — J45909 Unspecified asthma, uncomplicated: Secondary | ICD-10-CM

## 2011-12-28 DIAGNOSIS — H6690 Otitis media, unspecified, unspecified ear: Secondary | ICD-10-CM

## 2011-12-28 DIAGNOSIS — K219 Gastro-esophageal reflux disease without esophagitis: Secondary | ICD-10-CM

## 2011-12-28 DIAGNOSIS — H6692 Otitis media, unspecified, left ear: Secondary | ICD-10-CM

## 2011-12-28 DIAGNOSIS — E663 Overweight: Secondary | ICD-10-CM

## 2011-12-28 MED ORDER — PHENTERMINE HCL 37.5 MG PO CAPS: 37.5000 mg | ORAL_CAPSULE | ORAL | Status: DC

## 2011-12-28 NOTE — Assessment & Plan Note (Signed)
Well controlled at this time, her GI symptoms at last visit have all improved.

## 2011-12-28 NOTE — Assessment & Plan Note (Signed)
Good response to the addition of Singulair, needing less Albuterol

## 2011-12-28 NOTE — Patient Instructions (Addendum)
Calorie Counting Diet  A calorie counting diet requires you to eat the number of calories that are right for you in a day. Calories are the measurement of how much energy you get from the food you eat. Eating the right amount of calories is important for staying at a healthy weight. If you eat too many calories, your body will store them as fat and you may gain weight. If you eat too few calories, you may lose weight. Counting the number of calories you eat during a day will help you know if you are eating the right amount. A Registered Dietitian can determine how many calories you need in a day. The amount of calories needed varies from person to person.  If your goal is to lose weight, you will need to eat fewer calories. Losing weight can benefit you if you are overweight or have health problems such as heart disease, high blood pressure, or diabetes. If your goal is to gain weight, you will need to eat more calories. Gaining weight may be necessary if you have a certain health problem that causes your body to need more energy.  TIPS  Whether you are increasing or decreasing the number of calories you eat during a day, it may be hard to get used to changes in what you eat and drink. The following are tips to help you keep track of the number of calories you eat.   Measure foods at home with measuring cups. This helps you know the amount of food and number of calories you are eating.    Restaurants often serve food in amounts that are larger than 1 serving. While eating out, estimate how many servings of a food you are given. For example, a serving of cooked rice is  cup or about the size of half of a fist. Knowing serving sizes will help you be aware of how much food you are eating at restaurants.    Ask for smaller portion sizes or child-size portions at restaurants.    Plan to eat half of a meal at a restaurant. Take the rest home or share the other half with a friend.    Read the Nutrition Facts panel on  food labels for calorie content and serving size. You can find out how many servings are in a package, the size of a serving, and the number of calories each serving has.    For example, a package might contain 3 cookies. The Nutrition Facts panel on that package says that 1 serving is 1 cookie. Below that, it will say there are 3 servings in the container. The calories section of the Nutrition Facts label says there are 90 calories. This means there are 90 calories in 1 cookie (1 serving). If you eat 1 cookie you have eaten 90 calories. If you eat all 3 cookies, you have eaten 270 calories (3 servings x 90 calories = 270 calories).   The list below tells you how big or small some common portion sizes are.   1 oz.........4 stacked dice.    3 oz.........Deck of cards.    1 tsp........Tip of little finger.    1 tbs........Thumb.    2 tbs........Golf ball.     cup.......Half of a fist.    1 cup........A fist.   KEEP A FOOD LOG  Write down every food item you eat, the amount you eat, and the number of calories in each food you eat during the day. At the end of   the day, you can add up the total number of calories you have eaten. It may help to keep a list like the one below. Find out the calorie information by reading the Nutrition Facts panel on food labels.  Breakfast   Bran cereal (1 cup, 110 calories).    Fat-free milk ( cup, 45 calories).   Snack   Apple (1 medium, 80 calories).   Lunch   Spinach (1 cup, 20 calories).    Tomato ( medium, 20 calories).    Chicken breast strips (3 oz, 165 calories).    Shredded cheddar cheese ( cup, 110 calories).    Light Italian dressing (2 tbs, 60 calories).    Whole-wheat bread (1 slice, 80 calories).    Tub margarine (1 tsp, 35 calories).    Vegetable soup (1 cup, 160 calories).   Dinner   Pork chop (3 oz, 190 calories).    Brown rice (1 cup, 215 calories).    Steamed broccoli ( cup, 20 calories).    Strawberries (1  cup, 65 calories).    Whipped  cream (1 tbs, 50 calories).   Daily Calorie Total: 1425  Document Released: 06/06/2005 Document Revised: 05/26/2011 Document Reviewed: 12/01/2006  [employer] Patient Information 2012 [employer].

## 2011-12-28 NOTE — Progress Notes (Signed)
Patient ID: Kristina Watts, female   DOB: 1970-09-16, 41 y.o.   MRN: [id number] Kristina Watts [id number] 1970-12-22 12/28/2011      Progress Note-Follow Up  Subjective  Chief Complaint  Chief Complaint  Patient presents with  . Follow-up    medication re-check    HPI  Patient is a 41 year old Caucasian female who is in today for followup on weight loss. At her last visit she was actually complaining of ear pain and was found to have an otitis media. Pain is gone although she does have some popping today. Her breathing and her asthma as well as her allergies are well controlled on her current regimen with the addition of Singulair and Symbicort. She was having a lot of abdominal discomfort reflux and dyspepsia her last visit and all of that has improved as well. This for her bowels are moving well and she's having only minimal heartburn. She is frustrated regarding weight loss but didn't tolerate phentermine. Did not have any headaches or palpitations insomnia on it. Initially she felt somewhat more energized but also a little bit more agitated. After the first 2 weeks those symptoms subsided and she is tolerating the medication well.  Past Medical History  Diagnosis Date  . Chicken pox as a child  . Pneumonia 1998  . Allergy     seasonal  . Asthma   . Depression   . Recurrent cold sores   . Depression with anxiety 04/18/2011  . 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 Overweight 04/19/2011  . GDM (gestational diabetes mellitus) 04/19/2011  . History of pancreatitis 04/19/2011  . Allergic state 04/19/2011  . Preventative health care 04/19/2011  . Asthma 04/19/2011  . Tinea corporis 05/18/2011  . Fatigue 05/30/2011  . Otitis media of right ear 05/30/2011  . Dermatitis 05/18/2011  . Pharyngitis 08/23/2011  . Migraine 08/23/2011  . Pancreatitis, recurrent 12/12/2011  . Reflux 12/12/2011  . LUQ pain 12/12/2011    Past Surgical History  Procedure Date  . Appendectomy  2007  . Cholecystectomy 2000  . Cesarean section 1-02 and 4-04    Family History  Problem Relation Age of Onset  . Thyroid disease Mother   . Hypertension Father   . Hyperlipidemia Father   . Heart disease Father   . Diabetes Father   . COPD Father     previous smoker  . Stroke Father   . Depression Father   . Restless legs syndrome Father   . Diabetes Brother   . Cancer Maternal Grandmother     Head and Neck  . Heart disease Paternal Grandmother   . Hyperlipidemia Paternal Grandmother   . Hypertension Paternal Grandmother   . Diabetes Paternal Grandmother   . Depression Paternal Grandmother   . Stroke Paternal Grandmother   . Restless legs syndrome Paternal Grandmother   . Heart disease Paternal Grandfather   . Hyperlipidemia Paternal Grandfather   . Hypertension Paternal Grandfather   . Stroke Paternal Grandfather   . Diabetes Paternal Grandfather     History   Social History  . Marital Status: Married    Spouse Name: N/A    Number of Children: N/A  . Years of Education: N/A   Occupational History  . Not on file.   Social History Main Topics  . Smoking status: Never Smoker   . Smokeless tobacco: Never Used  . Alcohol Use: Yes     rarely  . Drug Use: No  . Sexually Active: Yes -- Female  partner(s)   Other Topics Concern  . Not on file   Social History Narrative  . No narrative on file    Current Outpatient Prescriptions on File Prior to Visit  Medication Sig Dispense Refill  . albuterol (PROVENTIL HFA;VENTOLIN HFA) 108 (90 BASE) MCG/ACT inhaler Inhale 2 puffs into the lungs as needed.        . ALPRAZolam (XANAX) 0.25 MG tablet Take 1 tablet (0.25 mg total) by mouth 3 (three) times daily as needed for anxiety.  30 tablet  1  . budesonide-formoterol (SYMBICORT) 160-4.5 MCG/ACT inhaler Inhale 2 puffs into the lungs 2 (two) times daily as needed.  1 Inhaler  4  . Butalbital-APAP-Caffeine 50-300-40 MG CAPS Take 1 capsule by mouth 3 (three) times daily as  needed. HA  84 capsule  1  . cetirizine (ZYRTEC) 10 MG tablet Take 10 mg by mouth daily.        . citalopram (CELEXA) 10 MG tablet Take 1 tablet (10 mg total) by mouth 2 (two) times daily.  180 tablet  1  . guaiFENesin (MUCINEX) 600 MG 12 hr tablet Take 1,200 mg by mouth 2 (two) times daily.      Marland Kitchen ibuprofen (ADVIL,MOTRIN) 200 MG tablet Take 200 mg by mouth every 6 (six) hours as needed.      . montelukast (SINGULAIR) 10 MG tablet Take 1 tablet (10 mg total) by mouth at bedtime.  30 tablet  3  . omeprazole (PRILOSEC OTC) 20 MG tablet Take 20 mg by mouth as needed.      . penciclovir (DENAVIR) 1 % cream Apply topically every 2 (two) hours.  1.5 g  1  . ranitidine (ZANTAC) 300 MG tablet Take 1 tablet (300 mg total) by mouth at bedtime.  30 tablet  5  . valACYclovir (VALTREX) 1000 MG tablet Take 1 tablet (1,000 mg total) by mouth 2 (two) times daily.  60 tablet  1    Allergies  Allergen Reactions  . Actifed (Triprolidine-Pse) Hives    Review of Systems  Review of Systems  Constitutional: Negative for fever and malaise/fatigue.  HENT: Negative for congestion.   Eyes: Negative for discharge.  Respiratory: Negative for shortness of breath.   Cardiovascular: Negative for chest pain, palpitations and leg swelling.  Gastrointestinal: Negative for nausea, abdominal pain and diarrhea.  Genitourinary: Negative for dysuria.  Musculoskeletal: Negative for falls.  Skin: Negative for rash.  Neurological: Negative for loss of consciousness and headaches.  Endo/Heme/Allergies: Negative for polydipsia.  Psychiatric/Behavioral: Negative for depression and suicidal ideas. The patient is not nervous/anxious and does not have insomnia.     Objective  BP 110/78  Temp 98 F (36.7 C) (Oral)  Wt 200 lb (90.719 kg)  LMP 12/03/2011  Physical Exam  Physical Exam  Vitals reviewed. Constitutional: She is oriented to person, place, and time and well-developed, well-nourished, and in no distress. No  distress.  HENT:  Head: Normocephalic and atraumatic.       TMs dull and retracted R>L  Eyes: Conjunctivae are normal.  Neck: Neck supple. No thyromegaly present.  Cardiovascular: Normal rate, regular rhythm and normal heart sounds.   No murmur heard. Pulmonary/Chest: Effort normal and breath sounds normal. She has no wheezes.  Abdominal: She exhibits no distension and no mass.  Musculoskeletal: She exhibits no edema.  Lymphadenopathy:    She has no cervical adenopathy.  Neurological: She is alert and oriented to person, place, and time.  Skin: Skin is warm and dry. No rash noted.  She is not diaphoretic.  Psychiatric: Memory, affect and judgment normal.    Lab Results  Component Value Date   TSH 0.98 05/16/2011   Lab Results  Component Value Date   WBC 6.5 12/12/2011   HGB 13.7 12/12/2011   HCT 40.3 12/12/2011   MCV 91.4 12/12/2011   PLT 251.0 12/12/2011   Lab Results  Component Value Date   CREATININE 0.7 12/12/2011   BUN 9 12/12/2011   NA 139 12/12/2011   K 4.2 12/12/2011   CL 103 12/12/2011   CO2 29 12/12/2011   Lab Results  Component Value Date   ALT 16 05/16/2011   AST 17 05/16/2011   ALKPHOS 57 05/16/2011   BILITOT 0.5 05/16/2011   Lab Results  Component Value Date   CHOL 166 05/16/2011   Lab Results  Component Value Date   HDL 52.90 05/16/2011   Lab Results  Component Value Date   LDLCALC 101* 05/16/2011   Lab Results  Component Value Date   TRIG 63.0 05/16/2011   Lab Results  Component Value Date   CHOLHDL 3 05/16/2011     Assessment & Plan  Otitis media of left ear TM still dull and retracted some but no erythema or discomfort, no further treatment at this time  Reflux Well controlled at this time, her GI symptoms at last visit have all improved.   Overweight Doing well on the Phentermine, felt a good increase in her energy and motivation initially and then it dissipated somewhat. Will try increasing her dose to 37.5 mg daily and recheck her  Vital signs again in 3 weeks or as needed, encouraged to continue decreased po intake and increased exercise.  Asthma Good response to the addition of Singulair, needing less Albuterol

## 2011-12-28 NOTE — Assessment & Plan Note (Signed)
Doing well on the Phentermine, felt a good increase in her energy and motivation initially and then it dissipated somewhat. Will try increasing her dose to 37.5 mg daily and recheck her Vital signs again in 3 weeks or as needed, encouraged to continue decreased po intake and increased exercise.

## 2011-12-28 NOTE — Assessment & Plan Note (Signed)
TM still dull and retracted some but no erythema or discomfort, no further treatment at this time

## 2012-01-24 ENCOUNTER — Ambulatory Visit (INDEPENDENT_AMBULATORY_CARE_PROVIDER_SITE_OTHER): Payer: Managed Care, Other (non HMO) | Admitting: Family Medicine

## 2012-01-24 ENCOUNTER — Encounter: Payer: Self-pay | Admitting: Family Medicine

## 2012-01-24 VITALS — BP 103/72 | HR 84 | Temp 97.9°F | Ht 65.0 in | Wt 199.4 lb

## 2012-01-24 DIAGNOSIS — J45909 Unspecified asthma, uncomplicated: Secondary | ICD-10-CM

## 2012-01-24 DIAGNOSIS — K219 Gastro-esophageal reflux disease without esophagitis: Secondary | ICD-10-CM

## 2012-01-24 DIAGNOSIS — F341 Dysthymic disorder: Secondary | ICD-10-CM

## 2012-01-24 DIAGNOSIS — F418 Other specified anxiety disorders: Secondary | ICD-10-CM

## 2012-01-24 DIAGNOSIS — E663 Overweight: Secondary | ICD-10-CM

## 2012-01-24 MED ORDER — PHENTERMINE HCL 37.5 MG PO CAPS: 37.5000 mg | ORAL_CAPSULE | ORAL | Status: AC

## 2012-01-24 NOTE — Assessment & Plan Note (Signed)
Did not use her Symbicort this am secondary to rushing to get here and has noted some mild increase in congestion over the past week or so. She will continue Mucinex bid, go use her Symbicort and let us know if she develops worseing symptoms such as fevers, cough, wheezing.

## 2012-01-24 NOTE — Progress Notes (Signed)
Patient ID: Kristina Watts, female   DOB: Apr 12, 1971, 41 y.o.   MRN: [id number] Kristina Watts [id number] [birth year] 01/24/2012      Progress Note-Follow Up  Subjective  Chief Complaint  Chief Complaint  Patient presents with  . Follow-up    3 week     HPI  Patient is a 41 year old Caucasian female who is in today for followup. She is struggling to lose weight has been under a great deal of stress. Her parents have moved to the area and are fighting a lot. Her husband's health continues to decline and her kids are home from school. She also notes recently her constipation is flared with no bowel movement for several days. She is tolerating phentermine. Denies any chest pain, palpitations, headache or concerning symptoms with phentermine and would like to try staying on it for 2 months as things settle down. No urinary complaints no chest pain shortness of breath fevers. She has noted some low-grade increasing congestion some fullness in her ears over the last one to 2 weeks. Has just started Mucinex and feels that helps. She did forget to take her Symbicort today but denies any wheezing or coughing.  Past Medical History  Diagnosis Date  . Chicken pox as a child  . Pneumonia 1998  . Allergy     seasonal  . Asthma   . Depression   . Recurrent cold sores   . Depression with anxiety 04/18/2011  . 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 Overweight 04/19/2011  . GDM (gestational diabetes mellitus) 04/19/2011  . History of pancreatitis 04/19/2011  . Allergic state 04/19/2011  . Preventative health care 04/19/2011  . Asthma 04/19/2011  . Tinea corporis 05/18/2011  . Fatigue 05/30/2011  . Otitis media of right ear 05/30/2011  . Dermatitis 05/18/2011  . Pharyngitis 08/23/2011  . Migraine 08/23/2011  . Pancreatitis, recurrent 12/12/2011  . Reflux 12/12/2011  . LUQ pain 12/12/2011    Past Surgical History  Procedure Date  . Appendectomy 2007  . Cholecystectomy 2000  .  Cesarean section 1-02 and 4-04    Family History  Problem Relation Age of Onset  . Thyroid disease Mother   . Hypertension Father   . Hyperlipidemia Father   . Heart disease Father   . Diabetes Father   . COPD Father     previous smoker  . Stroke Father   . Depression Father   . Restless legs syndrome Father   . Diabetes Brother   . Cancer Maternal Grandmother     Head and Neck  . Heart disease Paternal Grandmother   . Hyperlipidemia Paternal Grandmother   . Hypertension Paternal Grandmother   . Diabetes Paternal Grandmother   . Depression Paternal Grandmother   . Stroke Paternal Grandmother   . Restless legs syndrome Paternal Grandmother   . Heart disease Paternal Grandfather   . Hyperlipidemia Paternal Grandfather   . Hypertension Paternal Grandfather   . Stroke Paternal Grandfather   . Diabetes Paternal Grandfather     History   Social History  . Marital Status: Married    Spouse Name: N/A    Number of Children: N/A  . Years of Education: N/A   Occupational History  . Not on file.   Social History Main Topics  . Smoking status: Never Smoker   . Smokeless tobacco: Never Used  . Alcohol Use: Yes     rarely  . Drug Use: No  . Sexually Active: Yes -- Female partner(s)  Other Topics Concern  . Not on file   Social History Narrative  . No narrative on file    Current Outpatient Prescriptions on File Prior to Visit  Medication Sig Dispense Refill  . albuterol (PROVENTIL HFA;VENTOLIN HFA) 108 (90 BASE) MCG/ACT inhaler Inhale 2 puffs into the lungs as needed.        . ALPRAZolam (XANAX) 0.25 MG tablet Take 1 tablet (0.25 mg total) by mouth 3 (three) times daily as needed for anxiety.  30 tablet  1  . budesonide-formoterol (SYMBICORT) 160-4.5 MCG/ACT inhaler Inhale 2 puffs into the lungs 2 (two) times daily as needed.  1 Inhaler  4  . Butalbital-APAP-Caffeine 50-300-40 MG CAPS Take 1 capsule by mouth 3 (three) times daily as needed. HA  84 capsule  1  .  cetirizine (ZYRTEC) 10 MG tablet Take 10 mg by mouth daily.        . citalopram (CELEXA) 10 MG tablet Take 1 tablet (10 mg total) by mouth 2 (two) times daily.  180 tablet  1  . guaiFENesin (MUCINEX) 600 MG 12 hr tablet Take 1,200 mg by mouth 2 (two) times daily.      Marland Kitchen ibuprofen (ADVIL,MOTRIN) 200 MG tablet Take 200 mg by mouth every 6 (six) hours as needed.      . montelukast (SINGULAIR) 10 MG tablet Take 1 tablet (10 mg total) by mouth at bedtime.  30 tablet  3  . penciclovir (DENAVIR) 1 % cream Apply topically every 2 (two) hours.  1.5 g  1  . ranitidine (ZANTAC) 300 MG tablet Take 1 tablet (300 mg total) by mouth at bedtime.  30 tablet  5  . valACYclovir (VALTREX) 1000 MG tablet Take 1 tablet (1,000 mg total) by mouth 2 (two) times daily.  60 tablet  1    Allergies  Allergen Reactions  . Actifed (Triprolidine-Pse) Hives    Review of Systems  Review of Systems  Constitutional: Negative for fever and malaise/fatigue.  HENT: Positive for congestion.   Eyes: Negative for discharge.  Respiratory: Negative for cough and shortness of breath.   Cardiovascular: Negative for chest pain, palpitations and leg swelling.  Gastrointestinal: Positive for heartburn. Negative for nausea, abdominal pain and diarrhea.  Genitourinary: Negative for dysuria.  Musculoskeletal: Negative for falls.  Skin: Negative for rash.  Neurological: Negative for loss of consciousness and headaches.  Endo/Heme/Allergies: Negative for polydipsia.  Psychiatric/Behavioral: Positive for depression. Negative for suicidal ideas. The patient is nervous/anxious. The patient does not have insomnia.     Objective  BP 103/72  Pulse 84  Temp 97.9 F (36.6 C) (Temporal)  Ht 5\' 5"  (1.651 m)  Wt 199 lb 6.4 oz (90.447 kg)  BMI 33.18 kg/m2  SpO2 96%  LMP 12/26/2011  Physical Exam  Physical Exam  Constitutional: She is oriented to person, place, and time and well-developed, well-nourished, and in no distress. No  distress.  HENT:  Head: Normocephalic and atraumatic.  Eyes: Conjunctivae are normal.  Neck: Neck supple. No thyromegaly present.  Cardiovascular: Normal rate, regular rhythm and normal heart sounds.   No murmur heard. Pulmonary/Chest: Effort normal and breath sounds normal. She has no wheezes.  Abdominal: She exhibits no distension and no mass.  Musculoskeletal: She exhibits no edema.  Lymphadenopathy:    She has no cervical adenopathy.  Neurological: She is alert and oriented to person, place, and time.  Skin: Skin is warm and dry. No rash noted. She is not diaphoretic.  Psychiatric: Memory, affect and judgment normal.  Lab Results  Component Value Date   TSH 0.98 05/16/2011   Lab Results  Component Value Date   WBC 6.5 12/12/2011   HGB 13.7 12/12/2011   HCT 40.3 12/12/2011   MCV 91.4 12/12/2011   PLT 251.0 12/12/2011   Lab Results  Component Value Date   CREATININE 0.7 12/12/2011   BUN 9 12/12/2011   NA 139 12/12/2011   K 4.2 12/12/2011   CL 103 12/12/2011   CO2 29 12/12/2011   Lab Results  Component Value Date   ALT 16 05/16/2011   AST 17 05/16/2011   ALKPHOS 57 05/16/2011   BILITOT 0.5 05/16/2011   Lab Results  Component Value Date   CHOL 166 05/16/2011   Lab Results  Component Value Date   HDL 52.90 05/16/2011   Lab Results  Component Value Date   LDLCALC 101* 05/16/2011   Lab Results  Component Value Date   TRIG 63.0 05/16/2011   Lab Results  Component Value Date   CHOLHDL 3 05/16/2011     Assessment & Plan  Reflux Ranitidine is now on board every day and she is not noting any reflux. Will try taking the Ranitidine every other day and see if she has control, stomach pain is better.When she has to use it she gets adequate relief with Tums. Avoid offending foods   Overweight Refill given on Phentermine today, avoid simple carbs and trans fats. Increase exercise  Depression with anxiety Struggling with numerous stressors secondary to her parents  moving here, her husband's illness worsening etc. Feels she is handling it adequately with current meds  Asthma Did not use her Symbicort this am secondary to rushing to get here and has noted some mild increase in congestion over the past week or so. She will continue Mucinex bid, go use her Symbicort and let us know if she develops worseing symptoms such as fevers, cough, wheezing.

## 2012-01-24 NOTE — Assessment & Plan Note (Signed)
Refill given on Phentermine today, avoid simple carbs and trans fats. Increase exercise

## 2012-01-24 NOTE — Assessment & Plan Note (Signed)
Struggling with numerous stressors secondary to her parents moving here, her husband's illness worsening etc. Feels she is handling it adequately with current meds

## 2012-01-24 NOTE — Assessment & Plan Note (Addendum)
Ranitidine is now on board every day and she is not noting any reflux. Will try taking the Ranitidine every other day and see if she has control, stomach pain is better.When she has to use it she gets adequate relief with Tums. Avoid offending foods

## 2012-01-24 NOTE — Patient Instructions (Addendum)

## 2012-03-26 ENCOUNTER — Other Ambulatory Visit: Payer: Self-pay

## 2012-03-26 DIAGNOSIS — T7840XA Allergy, unspecified, initial encounter: Secondary | ICD-10-CM

## 2012-03-26 MED ORDER — MONTELUKAST SODIUM 10 MG PO TABS: 10.0000 mg | ORAL_TABLET | Freq: Every day | ORAL | Status: DC

## 2012-04-05 ENCOUNTER — Ambulatory Visit: Payer: Managed Care, Other (non HMO) | Admitting: Family Medicine

## 2012-04-23 ENCOUNTER — Other Ambulatory Visit: Payer: Managed Care, Other (non HMO)

## 2012-04-26 ENCOUNTER — Other Ambulatory Visit (INDEPENDENT_AMBULATORY_CARE_PROVIDER_SITE_OTHER): Payer: Managed Care, Other (non HMO)

## 2012-04-26 DIAGNOSIS — Z0000 Encounter for general adult medical examination without abnormal findings: Secondary | ICD-10-CM

## 2012-04-26 LAB — LIPID PANEL
HDL: 43.4 mg/dL (ref >39.00)
LDL Cholesterol: 120 mg/dL — ABNORMAL HIGH (ref 0–99)
Total CHOL/HDL Ratio: 4
VLDL: 26.2 mg/dL (ref 0.0–40.0)

## 2012-04-26 LAB — CBC
MCHC: 33.6 g/dL (ref 30.0–36.0)
RDW: 12.5 % (ref 11.5–14.6)
WBC: 5.5 10*3/uL (ref 4.5–10.5)

## 2012-04-26 LAB — HEPATIC FUNCTION PANEL
AST: 16 U/L (ref 0–37)
Albumin: 3.9 g/dL (ref 3.5–5.2)
Alkaline Phosphatase: 56 U/L (ref 39–117)
Bilirubin, Direct: 0.1 mg/dL (ref 0.0–0.3)
Total Bilirubin: 0.5 mg/dL (ref 0.3–1.2)

## 2012-04-26 LAB — RENAL FUNCTION PANEL
Albumin: 3.9 g/dL (ref 3.5–5.2)
BUN: 8 mg/dL (ref 6–23)
CO2: 30 mEq/L (ref 19–32)
Calcium: 8.5 mg/dL (ref 8.4–10.5)
Chloride: 102 mEq/L (ref 96–112)
Creatinine, Ser: 0.8 mg/dL (ref 0.4–1.2)

## 2012-04-26 LAB — TSH: TSH: 1.04 u[IU]/mL (ref 0.35–5.50)

## 2012-04-27 ENCOUNTER — Encounter: Payer: Self-pay | Admitting: Family Medicine

## 2012-04-27 ENCOUNTER — Ambulatory Visit (INDEPENDENT_AMBULATORY_CARE_PROVIDER_SITE_OTHER): Payer: Managed Care, Other (non HMO) | Admitting: Family Medicine

## 2012-04-27 VITALS — BP 109/74 | HR 77 | Temp 98.0°F | Ht 65.0 in | Wt 202.8 lb

## 2012-04-27 DIAGNOSIS — F341 Dysthymic disorder: Secondary | ICD-10-CM

## 2012-04-27 DIAGNOSIS — F418 Other specified anxiety disorders: Secondary | ICD-10-CM

## 2012-04-27 DIAGNOSIS — Z23 Encounter for immunization: Secondary | ICD-10-CM

## 2012-04-27 DIAGNOSIS — E785 Hyperlipidemia, unspecified: Secondary | ICD-10-CM

## 2012-04-27 DIAGNOSIS — K219 Gastro-esophageal reflux disease without esophagitis: Secondary | ICD-10-CM

## 2012-04-27 DIAGNOSIS — E663 Overweight: Secondary | ICD-10-CM

## 2012-04-27 DIAGNOSIS — J45909 Unspecified asthma, uncomplicated: Secondary | ICD-10-CM

## 2012-04-27 DIAGNOSIS — T7840XA Allergy, unspecified, initial encounter: Secondary | ICD-10-CM

## 2012-04-27 DIAGNOSIS — Z0000 Encounter for general adult medical examination without abnormal findings: Secondary | ICD-10-CM

## 2012-04-27 DIAGNOSIS — F988 Other specified behavioral and emotional disorders with onset usually occurring in childhood and adolescence: Secondary | ICD-10-CM

## 2012-04-27 HISTORY — DX: Other specified behavioral and emotional disorders with onset usually occurring in childhood and adolescence: F98.8

## 2012-04-27 HISTORY — DX: Hyperlipidemia, unspecified: E78.5

## 2012-04-27 MED ORDER — PHENTERMINE HCL 37.5 MG PO CAPS: 37.5000 mg | ORAL_CAPSULE | ORAL | Status: AC

## 2012-04-27 NOTE — Assessment & Plan Note (Signed)
Encouraged to increase exercise and get 7-8 hours of sleep a night. Encouraged DASH diet. Annual labs reviewed with patient

## 2012-04-27 NOTE — Patient Instructions (Addendum)
Try the MegaRed krill oil caps daily DASH diet  Calorie Counting Diet A calorie counting diet requires you to eat the number of calories that are right for you in a day. Calories are the measurement of how much energy you get from the food you eat. Eating the right amount of calories is important for staying at a healthy weight. If you eat too many calories, your body will store them as fat and you may gain weight. If you eat too few calories, you may lose weight. Counting the number of calories you eat during a day will help you know if you are eating the right amount. A Registered Dietitian can determine how many calories you need in a day. The amount of calories needed varies from person to person. If your goal is to lose weight, you will need to eat fewer calories. Losing weight can benefit you if you are overweight or have health problems such as heart disease, high blood pressure, or diabetes. If your goal is to gain weight, you will need to eat more calories. Gaining weight may be necessary if you have a certain health problem that causes your body to need more energy. TIPS Whether you are increasing or decreasing the number of calories you eat during a day, it may be hard to get used to changes in what you eat and drink. The following are tips to help you keep track of the number of calories you eat.  Measure foods at home with measuring cups. This helps you know the amount of food and number of calories you are eating.  Restaurants often serve food in amounts that are larger than 1 serving. While eating out, estimate how many servings of a food you are given. For example, a serving of cooked rice is  cup or about the size of half of a fist. Knowing serving sizes will help you be aware of how much food you are eating at restaurants.  Ask for smaller portion sizes or child-size portions at restaurants.  Plan to eat half of a meal at a restaurant. Take the rest home or share the other half  with a friend.  Read the Nutrition Facts panel on food labels for calorie content and serving size. You can find out how many servings are in a package, the size of a serving, and the number of calories each serving has.  For example, a package might contain 3 cookies. The Nutrition Facts panel on that package says that 1 serving is 1 cookie. Below that, it will say there are 3 servings in the container. The calories section of the Nutrition Facts label says there are 90 calories. This means there are 90 calories in 1 cookie (1 serving). If you eat 1 cookie you have eaten 90 calories. If you eat all 3 cookies, you have eaten 270 calories (3 servings x 90 calories = 270 calories). The list below tells you how big or small some common portion sizes are.  1 oz.........4 stacked dice.  3 oz........Marland KitchenDeck of cards.  1 tsp.......Marland KitchenTip of little finger.  1 tbs......Marland KitchenMarland KitchenThumb.  2 tbs.......Marland KitchenGolf ball.   cup......Marland KitchenHalf of a fist.  1 cup.......Marland KitchenA fist. KEEP A FOOD LOG Write down every food item you eat, the amount you eat, and the number of calories in each food you eat during the day. At the end of the day, you can add up the total number of calories you have eaten. It may help to keep a list like the  one below. Find out the calorie information by reading the Nutrition Facts panel on food labels. Breakfast  Bran cereal (1 cup, 110 calories).  Fat-free milk ( cup, 45 calories). Snack  Apple (1 medium, 80 calories). Lunch  Spinach (1 cup, 20 calories).  Tomato ( medium, 20 calories).  Chicken breast strips (3 oz, 165 calories).  Shredded cheddar cheese ( cup, 110 calories).  Light [place] dressing (2 tbs, 60 calories).  Whole-wheat bread (1 slice, 80 calories).  Tub margarine (1 tsp, 35 calories).  Vegetable soup (1 cup, 160 calories). Dinner  Pork chop (3 oz, 190 calories).  Brown rice (1 cup, 215 calories).  Steamed broccoli ( cup, 20 calories).  Strawberries (1  cup,  65 calories).  Whipped cream (1 tbs, 50 calories). Daily Calorie Total: 1425 Document Released: 06/06/2005 Document Revised: 08/29/2011 Document Reviewed: 12/01/2006 [facility] Patient Information 2013 [place], [place].

## 2012-04-27 NOTE — Assessment & Plan Note (Addendum)
Given refill on the Phentermine which was helpful at suppressing her appetite and increasing her energy level. Avoid trans fats and increase exercise

## 2012-04-27 NOTE — Assessment & Plan Note (Signed)
Stable and doing well. 

## 2012-04-30 NOTE — Assessment & Plan Note (Signed)
Will consider Ritalin in near future once patient done with Phentermine

## 2012-04-30 NOTE — Assessment & Plan Note (Signed)
Stable on Singulair and Zyrtec.

## 2012-04-30 NOTE — Assessment & Plan Note (Signed)
No recent flares 

## 2012-04-30 NOTE — Progress Notes (Signed)
Patient ID: Kristina Watts, female   DOB: 1970-09-02, 41 y.o.   MRN: [id number] Kristina Watts [id number] 13-May-1971 04/30/2012      Progress Note New Patient  Subjective  Chief Complaint  Chief Complaint  Patient presents with  . Annual Exam    physical  . Injections    FLU    HPI  Patient is a 41 year old Caucasian female who is in today for annual exam. Overall she's doing well. Is requesting refill on the phentermine. Noted more than anything that increase energy and was able to get things done. For more active. Did suppress her appetite somewhat. No complaints of recent illness. No fevers or chills. No congestion or headache. No chest pain, palpitations, shortness of breath, GI or GU complaints. Her allergies and asthma well-controlled on current medications appear she continues to avoid offending foods and her heartburn has been controlled recently. She is wondering if she has ADD with difficulty completing tasks and staying focused  Past Medical History  Diagnosis Date  . Chicken pox as a child  . Pneumonia 1998  . Allergy     seasonal  . Asthma   . Depression   . Recurrent cold sores   . Depression with anxiety 04/18/2011  . 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 Overweight 04/19/2011  . GDM (gestational diabetes mellitus) 04/19/2011  . History of pancreatitis 04/19/2011  . Allergic state 04/19/2011  . Preventative health care 04/19/2011  . Asthma 04/19/2011  . Tinea corporis 05/18/2011  . Fatigue 05/30/2011  . Otitis media of right ear 05/30/2011  . Dermatitis 05/18/2011  . Pharyngitis 08/23/2011  . Migraine 08/23/2011  . Pancreatitis, recurrent 12/12/2011  . Reflux 12/12/2011  . LUQ pain 12/12/2011  . Hyperlipidemia 04/27/2012  . ADD (attention deficit disorder) 04/27/2012    Past Surgical History  Procedure Date  . Appendectomy 2007  . Cholecystectomy 2000  . Cesarean section 1-02 and 4-04    Family History  Problem Relation Age of Onset  .  Thyroid disease Mother   . Hypertension Father   . Hyperlipidemia Father   . Heart disease Father   . Diabetes Father   . COPD Father     previous smoker  . Stroke Father   . Depression Father   . Restless legs syndrome Father   . Diabetes Brother   . Cancer Maternal Grandmother     Head and Neck  . Heart disease Paternal Grandmother   . Hyperlipidemia Paternal Grandmother   . Hypertension Paternal Grandmother   . Diabetes Paternal Grandmother   . Depression Paternal Grandmother   . Stroke Paternal Grandmother   . Restless legs syndrome Paternal Grandmother   . Heart disease Paternal Grandfather   . Hyperlipidemia Paternal Grandfather   . Hypertension Paternal Grandfather   . Stroke Paternal Grandfather   . Diabetes Paternal Grandfather     History   Social History  . Marital Status: Married    Spouse Name: N/A    Number of Children: N/A  . Years of Education: N/A   Occupational History  . Not on file.   Social History Main Topics  . Smoking status: Never Smoker   . Smokeless tobacco: Never Used  . Alcohol Use: Yes     Comment: rarely  . Drug Use: No  . Sexually Active: Yes -- Female partner(s)   Other Topics Concern  . Not on file   Social History Narrative  . No narrative on file    Current  Outpatient Prescriptions on File Prior to Visit  Medication Sig Dispense Refill  . albuterol (PROVENTIL HFA;VENTOLIN HFA) 108 (90 BASE) MCG/ACT inhaler Inhale 2 puffs into the lungs as needed.        . ALPRAZolam (XANAX) 0.25 MG tablet Take 1 tablet (0.25 mg total) by mouth 3 (three) times daily as needed for anxiety.  30 tablet  1  . budesonide-formoterol (SYMBICORT) 160-4.5 MCG/ACT inhaler Inhale 2 puffs into the lungs 2 (two) times daily as needed.  1 Inhaler  4  . Butalbital-APAP-Caffeine 50-300-40 MG CAPS Take 1 capsule by mouth 3 (three) times daily as needed. HA  84 capsule  1  . cetirizine (ZYRTEC) 10 MG tablet Take 10 mg by mouth daily.        . citalopram  (CELEXA) 10 MG tablet Take 1 tablet (10 mg total) by mouth 2 (two) times daily.  180 tablet  1  . montelukast (SINGULAIR) 10 MG tablet Take 1 tablet (10 mg total) by mouth at bedtime.  30 tablet  3  . penciclovir (DENAVIR) 1 % cream Apply topically every 2 (two) hours.  1.5 g  1  . Probiotic Product (PROBIOTIC DAILY PO) Take by mouth.      . valACYclovir (VALTREX) 1000 MG tablet Take 1 tablet (1,000 mg total) by mouth 2 (two) times daily.  60 tablet  1  . Multiple Vitamin (MULTIVITAMIN) tablet Take 1 tablet by mouth daily.        Allergies  Allergen Reactions  . Actifed (Triprolidine-Pse) Hives    Review of Systems  Review of Systems  Constitutional: Positive for malaise/fatigue. Negative for fever and chills.  HENT: Negative for hearing loss, nosebleeds and congestion.   Eyes: Negative for discharge.  Respiratory: Negative for cough, sputum production, shortness of breath and wheezing.   Cardiovascular: Negative for chest pain, palpitations and leg swelling.  Gastrointestinal: Negative for heartburn, nausea, vomiting, abdominal pain, diarrhea, constipation and blood in stool.  Genitourinary: Negative for dysuria, urgency, frequency and hematuria.  Musculoskeletal: Negative for myalgias, back pain and falls.  Skin: Negative for rash.  Neurological: Negative for dizziness, tremors, sensory change, focal weakness, loss of consciousness, weakness and headaches.  Endo/Heme/Allergies: Negative for polydipsia. Does not bruise/bleed easily.  Psychiatric/Behavioral: Negative for depression and suicidal ideas. The patient is not nervous/anxious and does not have insomnia.     Objective  BP 109/74  Pulse 77  Temp 98 F (36.7 C) (Temporal)  Ht 5\' 5"  (1.651 m)  Wt 202 lb 12.8 oz (91.989 kg)  BMI 33.75 kg/m2  SpO2 98%  LMP 04/25/2012  Physical Exam  Physical Exam  Constitutional: She is oriented to person, place, and time and well-developed, well-nourished, and in no distress. No  distress.  HENT:  Head: Normocephalic and atraumatic.  Right Ear: External ear normal.  Left Ear: External ear normal.  Nose: Nose normal.  Mouth/Throat: Oropharynx is clear and moist. No oropharyngeal exudate.  Eyes: Conjunctivae normal are normal. Pupils are equal, round, and reactive to light. Right eye exhibits no discharge. Left eye exhibits no discharge. No scleral icterus.  Neck: Normal range of motion. Neck supple. No thyromegaly present.  Cardiovascular: Normal rate, regular rhythm, normal heart sounds and intact distal pulses.   No murmur heard. Pulmonary/Chest: Effort normal and breath sounds normal. No respiratory distress. She has no wheezes. She has no rales.  Abdominal: Soft. Bowel sounds are normal. She exhibits no distension and no mass. There is no tenderness.  Musculoskeletal: Normal range of motion.  She exhibits no edema and no tenderness.  Lymphadenopathy:    She has no cervical adenopathy.  Neurological: She is alert and oriented to person, place, and time. She has normal reflexes. No cranial nerve deficit. Coordination normal.  Skin: Skin is warm and dry. No rash noted. She is not diaphoretic.  Psychiatric: Mood, memory and affect normal.       Assessment & Plan  Preventative health care Encouraged to increase exercise and get 7-8 hours of sleep a night. Encouraged DASH diet. Annual labs reviewed with patient  Overweight Given refill on the Phentermine which was helpful at suppressing her appetite and increasing her energy level. Avoid trans fats and increase exercise  Depression with anxiety Stable and doing well  ADD (attention deficit disorder) Will consider Ritalin in near future once patient done with Phentermine  Asthma No recent flares.   Allergic state Stable on Singulair and Zyrtec.   Reflux Avoid offending foods, continue probiotics

## 2012-04-30 NOTE — Assessment & Plan Note (Signed)
Avoid offending foods, continue probiotics. 

## 2012-07-02 ENCOUNTER — Ambulatory Visit (INDEPENDENT_AMBULATORY_CARE_PROVIDER_SITE_OTHER): Payer: Managed Care, Other (non HMO) | Admitting: Family Medicine

## 2012-07-02 ENCOUNTER — Encounter: Payer: Self-pay | Admitting: Family Medicine

## 2012-07-02 VITALS — BP 109/74 | HR 107 | Temp 98.3°F | Ht 65.0 in | Wt 202.1 lb

## 2012-07-02 DIAGNOSIS — J029 Acute pharyngitis, unspecified: Secondary | ICD-10-CM | POA: Insufficient documentation

## 2012-07-02 LAB — POCT RAPID STREP A (OFFICE): Rapid Strep A Screen: NEGATIVE

## 2012-07-02 MED ORDER — AMOXICILLIN-POT CLAVULANATE 875-125 MG PO TABS: 1.0000 | ORAL_TABLET | Freq: Two times a day (BID) | ORAL | Status: DC

## 2012-07-02 NOTE — Progress Notes (Signed)
Patient ID: Kristina Watts, female   DOB: 10-Apr-1971, 42 y.o.   MRN: [id number] Kristina Watts [id number] January 02, 1971 07/02/2012      Progress Note-Follow Up  Subjective  Chief Complaint  Chief Complaint  Patient presents with  . Sore Throat    X 4 days  . Headache    X 4 days  . Nasal Congestion    X 4 days    HPI  Patient is a 42 year old Caucasian female who is in today with a 4-5 day history of worsening respiratory symptoms. She is complaining of significant sore throat or difficulty swallowing. Tylenol and Ibuprofen help marginally. She has been taking doses of either every 4 hours sometimes every 2. She's also complaining of some intermittent head congestion he says has been marginally helpful some malaise, myalgias and some wheezing. No obvious high-grade fevers or chills. No chest pain or palpitations. No shortness of breath GI or GU complaints are noted.  Past Medical History  Diagnosis Date  . Chicken pox as a child  . Pneumonia 1998  . Allergy     seasonal  . Asthma   . Depression   . Recurrent cold sores   . Depression with anxiety 04/18/2011  . 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 Overweight 04/19/2011  . GDM (gestational diabetes mellitus) 04/19/2011  . History of pancreatitis 04/19/2011  . Allergic state 04/19/2011  . Preventative health care 04/19/2011  . Asthma 04/19/2011  . Tinea corporis 05/18/2011  . Fatigue 05/30/2011  . Otitis media of right ear 05/30/2011  . Dermatitis 05/18/2011  . Pharyngitis 08/23/2011  . Migraine 08/23/2011  . Pancreatitis, recurrent 12/12/2011  . Reflux 12/12/2011  . LUQ pain 12/12/2011  . Hyperlipidemia 04/27/2012  . ADD (attention deficit disorder) 04/27/2012    Past Surgical History  Procedure Date  . Appendectomy 2007  . Cholecystectomy 2000  . Cesarean section 1-02 and 4-04    Family History  Problem Relation Age of Onset  . Thyroid disease Mother   . Hypertension Father   . Hyperlipidemia Father     . Heart disease Father   . Diabetes Father   . COPD Father     previous smoker  . Stroke Father   . Depression Father   . Restless legs syndrome Father   . Diabetes Brother   . Cancer Maternal Grandmother     Head and Neck  . Heart disease Paternal Grandmother   . Hyperlipidemia Paternal Grandmother   . Hypertension Paternal Grandmother   . Diabetes Paternal Grandmother   . Depression Paternal Grandmother   . Stroke Paternal Grandmother   . Restless legs syndrome Paternal Grandmother   . Heart disease Paternal Grandfather   . Hyperlipidemia Paternal Grandfather   . Hypertension Paternal Grandfather   . Stroke Paternal Grandfather   . Diabetes Paternal Grandfather     History   Social History  . Marital Status: Married    Spouse Name: N/A    Number of Children: N/A  . Years of Education: N/A   Occupational History  . Not on file.   Social History Main Topics  . Smoking status: Never Smoker   . Smokeless tobacco: Never Used  . Alcohol Use: Yes     Comment: rarely  . Drug Use: No  . Sexually Active: Yes -- Female partner(s)   Other Topics Concern  . Not on file   Social History Narrative  . No narrative on file    Current Outpatient Prescriptions on  File Prior to Visit  Medication Sig Dispense Refill  . albuterol (PROVENTIL HFA;VENTOLIN HFA) 108 (90 BASE) MCG/ACT inhaler Inhale 2 puffs into the lungs as needed.        . budesonide-formoterol (SYMBICORT) 160-4.5 MCG/ACT inhaler Inhale 2 puffs into the lungs 2 (two) times daily as needed.  1 Inhaler  4  . Butalbital-APAP-Caffeine 50-300-40 MG CAPS Take 1 capsule by mouth 3 (three) times daily as needed. HA  84 capsule  1  . cetirizine (ZYRTEC) 10 MG tablet Take 10 mg by mouth daily.        . citalopram (CELEXA) 10 MG tablet Take 1 tablet (10 mg total) by mouth 2 (two) times daily.  180 tablet  1  . montelukast (SINGULAIR) 10 MG tablet Take 1 tablet (10 mg total) by mouth at bedtime.  30 tablet  3  . Multiple  Vitamin (MULTIVITAMIN) tablet Take 1 tablet by mouth daily.      Marland Kitchen penciclovir (DENAVIR) 1 % cream Apply topically every 2 (two) hours.  1.5 g  1  . Probiotic Product (PROBIOTIC DAILY PO) Take by mouth.      . valACYclovir (VALTREX) 1000 MG tablet Take 1 tablet (1,000 mg total) by mouth 2 (two) times daily.  60 tablet  1    Allergies  Allergen Reactions  . Actifed (Triprolidine-Pse) Hives    Review of Systems  Review of Systems  Constitutional: Positive for malaise/fatigue. Negative for fever.  HENT: Positive for ear pain and sore throat. Negative for congestion and ear discharge.   Eyes: Negative for discharge.  Respiratory: Negative for shortness of breath.   Cardiovascular: Negative for chest pain, palpitations and leg swelling.  Gastrointestinal: Negative for nausea, abdominal pain and diarrhea.  Genitourinary: Negative for dysuria.  Musculoskeletal: Positive for myalgias. Negative for falls.  Skin: Negative for rash.  Neurological: Positive for headaches. Negative for loss of consciousness.  Endo/Heme/Allergies: Negative for polydipsia.  Psychiatric/Behavioral: Negative for depression and suicidal ideas. The patient is not nervous/anxious and does not have insomnia.     Objective  BP 109/74  Pulse 107  Temp 98.3 F (36.8 C) (Temporal)  Ht 5\' 5"  (1.651 m)  Wt 202 lb 1.9 oz (91.681 kg)  BMI 33.63 kg/m2  SpO2 97%  LMP 06/08/2012  Physical Exam  Physical Exam  Constitutional: She is oriented to person, place, and time and well-developed, well-nourished, and in no distress. No distress.  HENT:  Head: Normocephalic and atraumatic.       TMs erythematous b/l oropharynx is erythematous and edematous.  Eyes: Conjunctivae normal are normal.  Neck: Neck supple. No thyromegaly present.  Cardiovascular: Normal rate, regular rhythm and normal heart sounds.   No murmur heard. Pulmonary/Chest: Effort normal and breath sounds normal. She has no wheezes.  Abdominal: She  exhibits no distension and no mass.  Musculoskeletal: She exhibits no edema.  Lymphadenopathy:    She has cervical adenopathy.  Neurological: She is alert and oriented to person, place, and time.  Skin: Skin is warm and dry. No rash noted. She is not diaphoretic.  Psychiatric: Memory, affect and judgment normal.    Lab Results  Component Value Date   TSH 1.04 04/26/2012   Lab Results  Component Value Date   WBC 5.5 04/26/2012   HGB 13.3 04/26/2012   HCT 39.6 04/26/2012   MCV 92.4 04/26/2012   PLT 251.0 04/26/2012   Lab Results  Component Value Date   CREATININE 0.8 04/26/2012   BUN 8 04/26/2012  NA 138 04/26/2012   K 4.3 04/26/2012   CL 102 04/26/2012   CO2 30 04/26/2012   Lab Results  Component Value Date   ALT 15 04/26/2012   AST 16 04/26/2012   ALKPHOS 56 04/26/2012   BILITOT 0.5 04/26/2012   Lab Results  Component Value Date   CHOL 190 04/26/2012   Lab Results  Component Value Date   HDL 43.40 04/26/2012   Lab Results  Component Value Date   LDLCALC 120* 04/26/2012   Lab Results  Component Value Date   TRIG 131.0 04/26/2012   Lab Results  Component Value Date   CHOLHDL 4 04/26/2012     Assessment & Plan  Pharyngitis Rapid strep negative, sent for confirmation. Started on antibiotics and mucinex, increase rest and hydration.

## 2012-07-02 NOTE — Patient Instructions (Addendum)

## 2012-07-02 NOTE — Assessment & Plan Note (Signed)
Rapid strep negative, sent for confirmation. Started on antibiotics and mucinex, increase rest and hydration.

## 2012-07-04 ENCOUNTER — Telehealth: Payer: Self-pay

## 2012-07-04 MED ORDER — METHYLPREDNISOLONE 4 MG PO KIT: 4.0000 mg | PACK | ORAL | Status: DC

## 2012-07-04 NOTE — Telephone Encounter (Signed)
Message left with husband. I also sent Medrol to pharmacy

## 2012-07-04 NOTE — Telephone Encounter (Signed)
Pt left a message stating that she is feeling better but not good. Pt states she is still taking Tylenol and Advil q 4 hours to keep pain and swelling down. Ear is a little better but still hurts. Pt stated she is still exhausted. Pt stated she remembers feeling like this once before when she was a teenager and was diagnosed with mono. Please advise if pt should try anything else?

## 2012-07-04 NOTE — Telephone Encounter (Signed)
So likely this is viral and thus not responding to the antibiotic, It is very unlikely for adults to get Mono although not impossible. The treatment is basically antiinflammatories, rest and increased fluids, could give a medrol dose pak if the swelling and pain in the throat are still bad.

## 2012-09-23 ENCOUNTER — Other Ambulatory Visit: Payer: Self-pay | Admitting: Family Medicine

## 2012-09-24 NOTE — Telephone Encounter (Signed)
Rx request to pharmacy/[facility]  

## 2012-10-01 ENCOUNTER — Telehealth: Payer: Self-pay | Admitting: Family Medicine

## 2012-10-01 DIAGNOSIS — F418 Other specified anxiety disorders: Secondary | ICD-10-CM

## 2012-10-01 MED ORDER — CITALOPRAM HYDROBROMIDE 10 MG PO TABS: 10.0000 mg | ORAL_TABLET | Freq: Two times a day (BID) | ORAL | Status: DC

## 2012-10-01 NOTE — Telephone Encounter (Signed)
REFILL CITALOPRAM HBR 10 MG TAB TAKE 1 TABLET BY MOUTH 2 TIMES PER DAY  QTY 180

## 2012-10-15 ENCOUNTER — Encounter: Payer: Self-pay | Admitting: Nurse Practitioner

## 2012-10-15 ENCOUNTER — Ambulatory Visit (INDEPENDENT_AMBULATORY_CARE_PROVIDER_SITE_OTHER): Payer: Managed Care, Other (non HMO) | Admitting: Nurse Practitioner

## 2012-10-15 VITALS — BP 110/70 | HR 88 | Temp 98.2°F | Ht 65.0 in | Wt 205.8 lb

## 2012-10-15 DIAGNOSIS — J029 Acute pharyngitis, unspecified: Secondary | ICD-10-CM

## 2012-10-15 NOTE — Progress Notes (Signed)
  Subjective:    Patient ID: Kristina Watts, female    DOB: [birth year], 42 y.o.   MRN: [id number]  Sore Throat  This is a new problem. The current episode started yesterday. The problem has been gradually worsening. The pain is worse on the left side. There has been no fever. The pain is mild. Associated symptoms include congestion (related to allergies, has been taking allergy meds for about 1 week), ear pain (has been taking mucinex for allergies for several weeks), headaches (had migraine 2 days ago) and swollen glands. Pertinent negatives include no coughing, diarrhea or shortness of breath. She has had exposure to strep (10 y.o. daughter diagnosed in last 2 days). She has tried NSAIDs for the symptoms. The treatment provided moderate relief.      Review of Systems  Constitutional: Negative for fever (pt states her normal is 96.0).  HENT: Positive for ear pain (has been taking mucinex for allergies for several weeks), congestion (related to allergies, has been taking allergy meds for about 1 week) and sore throat (started yesterday). Negative for mouth sores.   Respiratory: Positive for chest tightness (started using symbicort recently due to some chest tightness, no wheezing or cough. Has not needed rescue inhaler.). Negative for cough, shortness of breath and wheezing.   Cardiovascular: Negative for chest pain.  Gastrointestinal: Negative for nausea and diarrhea.  Musculoskeletal: Negative for myalgias and arthralgias.  Skin: Negative for rash.  Allergic/Immunologic: Positive for environmental allergies (allergies have flared in last week).  Neurological: Positive for headaches (had migraine 2 days ago).  Hematological: Positive for adenopathy (L side neck sore).       Objective:   Physical Exam  Constitutional: She is oriented to person, place, and time. She appears well-developed and well-nourished. No distress.  HENT:  Head: Normocephalic and atraumatic.  Right Ear: Hearing,  external ear and ear canal normal. A middle ear effusion is present.  Left Ear: Hearing, tympanic membrane, external ear and ear canal normal.  Mouth/Throat: Uvula is midline, oropharynx is clear and moist and mucous membranes are normal. No edematous. No oropharyngeal exudate, posterior oropharyngeal edema or posterior oropharyngeal erythema.  Neck: Normal range of motion. Neck supple.  Pulmonary/Chest: Effort normal and breath sounds normal. No respiratory distress. She has no wheezes.  Lymphadenopathy:    She has cervical adenopathy (L anterior cervical LAD, tender, moveable).  Neurological: She is alert and oriented to person, place, and time.  Skin: Skin is warm and dry. No rash noted.  Psychiatric: She has a normal mood and affect. Her behavior is normal. Thought content normal.          Assessment & Plan:  1) Pharyngitis. Rapid strep negative. Strep a dna probe sent. Will start penicillin if pos. Tylenol or Ibuprophen PRN pain. Benzocaine throat lozenges PRN. Sinus washes daily for post-nasal drip. 2)asthma: Just restarted symbicort due to some chest tightness. Rescue inhaler PRN. Continue allergy management.

## 2012-10-15 NOTE — Patient Instructions (Addendum)
Your rapid strep test is negative. We will have the culture results by Wednesday and will call in penicillin if it is positive. In the meantime use tylenil or ibuprophen for sore throat. Benzocaine throat lozenges can be helpful. If you are experiencing post-nasal drip, use Neilmed sinus wash daily. Read package insert for detailed instructions and bottle cleansing. Feel better!

## 2012-10-16 LAB — STREP A DNA PROBE: GASP: NEGATIVE

## 2012-10-17 ENCOUNTER — Telehealth: Payer: Self-pay | Admitting: Nurse Practitioner

## 2012-10-17 NOTE — Telephone Encounter (Signed)
LM advising strep culture negative. Cell phone.

## 2012-10-25 ENCOUNTER — Ambulatory Visit (INDEPENDENT_AMBULATORY_CARE_PROVIDER_SITE_OTHER): Payer: Managed Care, Other (non HMO) | Admitting: Nurse Practitioner

## 2012-10-25 ENCOUNTER — Encounter: Payer: Self-pay | Admitting: Nurse Practitioner

## 2012-10-25 VITALS — BP 100/70 | HR 93 | Temp 98.0°F | Ht 65.0 in | Wt 197.8 lb

## 2012-10-25 DIAGNOSIS — R1310 Dysphagia, unspecified: Secondary | ICD-10-CM

## 2012-10-25 DIAGNOSIS — J029 Acute pharyngitis, unspecified: Secondary | ICD-10-CM

## 2012-10-25 NOTE — Patient Instructions (Addendum)
Lets get the results of your strep culture, then we will know better how to proceed. Your symptoms may be explained by reflux disease. Start prilosec 40 mg daily as trial. If strep culture is negative and prilosec does not relieve symptoms, we can consider CT of neck. If you have an apthous ulcer, Lysine can help:500 mg Lysine twice daily for 4-5 days.

## 2012-10-25 NOTE — Progress Notes (Signed)
  Subjective:    Patient ID: Kristina Watts, female    DOB: 10-16-1970, 42 y.o.   MRN: [id number]  Sore Throat  This is a recurrent problem. The current episode started 1 to 4 weeks ago. Progression since onset: symptoms have changed from painful swallowing to feeling like "something stuck in throat" The pain is worse on the left side. There has been no fever. The pain is mild. Associated symptoms include congestion, coughing (occasional), neck pain (neck discomfort L side, "feels like pill stuck in throat") and trouble swallowing (feels like pill stuck in throat). Pertinent negatives include no abdominal pain, diarrhea, ear pain, headaches or vomiting. Associated symptoms comments: Daughter was dx'd last week with strep pharyngitis. Better now. . She has had exposure to strep. Exposure to: neg rapid strep and strep a dna probe last week. Treatments tried: benzocaine lozenges. The treatment provided mild relief.      Review of Systems  Constitutional: Negative.  Negative for fever, appetite change and fatigue.  HENT: Positive for congestion, sore throat (sore throat started 10 days ago, has gotten better), mouth sores (remote hx of apthous ulcers), trouble swallowing (feels like pill stuck in throat) and neck pain (neck discomfort L side, "feels like pill stuck in throat"). Negative for ear pain, rhinorrhea, voice change and sinus pressure.   Eyes: Negative.   Respiratory: Positive for cough (occasional) and wheezing. Negative for chest tightness.   Cardiovascular: Negative for chest pain.  Gastrointestinal: Negative for nausea, vomiting, abdominal pain and diarrhea.  Skin: Negative for rash.  Allergic/Immunologic: Positive for environmental allergies.  Neurological: Negative for headaches.  Hematological: Positive for adenopathy (L neck feels tender).  Psychiatric/Behavioral: Negative for sleep disturbance.       Objective:   Physical Exam  Nursing note and vitals  reviewed. Constitutional: She is oriented to person, place, and time. She appears well-developed and well-nourished. No distress.  HENT:  Head: Normocephalic and atraumatic. No trismus in the jaw.  Right Ear: External ear normal.  Left Ear: External ear normal.  Mouth/Throat: Uvula is midline and mucous membranes are normal. No oral lesions. No edematous. Posterior oropharyngeal erythema (slight posterioir bilateral erythema) present. No oropharyngeal exudate, posterior oropharyngeal edema or tonsillar abscesses.  Eyes: Conjunctivae are normal. Pupils are equal, round, and reactive to light.  Neck: Normal range of motion. Neck supple. No thyromegaly present.  Cardiovascular: Normal rate, regular rhythm and normal heart sounds.   No murmur heard. Pulmonary/Chest: Effort normal. She has wheezes (ocasional expiratory & inspiratory wheeze).  Abdominal: Soft. Bowel sounds are normal.  Musculoskeletal: Normal range of motion.  Lymphadenopathy:    She has cervical adenopathy (thickened area L neck, no palpable node, pt c/o tenderness).  Neurological: She is alert and oriented to person, place, and time.  Skin: Skin is warm and dry. No rash noted.  Psychiatric: She has a normal mood and affect. Her behavior is normal. Thought content normal.          Assessment & Plan:  1. Dysphagia, unspecified Rapid strep Neg. - Culture, Group A Strep - POCT rapid strep A May be GERD. Start Prilosec 40 mg daily. Consider CT imaging of nexk soft tissue if culture neg & prilosec does not relieve symptoms.  2. Acute pharyngitis Plan ABX if culture pos. - Culture, Group A Strep - POCT rapid strep A

## 2012-10-29 ENCOUNTER — Telehealth: Payer: Self-pay | Admitting: Nurse Practitioner

## 2012-10-29 NOTE — Telephone Encounter (Signed)
See telephone note.

## 2012-11-13 ENCOUNTER — Other Ambulatory Visit: Payer: Self-pay

## 2012-11-13 MED ORDER — PHENTERMINE HCL 37.5 MG PO TABS: 37.5000 mg | ORAL_TABLET | Freq: Every day | ORAL | Status: DC

## 2012-11-13 NOTE — Telephone Encounter (Signed)
Please advise Phentermine refill?   If ok fax to [phone]

## 2012-11-15 ENCOUNTER — Ambulatory Visit (INDEPENDENT_AMBULATORY_CARE_PROVIDER_SITE_OTHER): Payer: Managed Care, Other (non HMO) | Admitting: Family Medicine

## 2012-11-15 ENCOUNTER — Encounter: Payer: Self-pay | Admitting: Family Medicine

## 2012-11-15 ENCOUNTER — Other Ambulatory Visit: Payer: Self-pay | Admitting: Family Medicine

## 2012-11-15 VITALS — BP 102/72 | HR 90 | Temp 98.2°F | Ht 65.0 in | Wt 199.0 lb

## 2012-11-15 DIAGNOSIS — J4520 Mild intermittent asthma, uncomplicated: Secondary | ICD-10-CM

## 2012-11-15 DIAGNOSIS — E663 Overweight: Secondary | ICD-10-CM

## 2012-11-15 DIAGNOSIS — Z020 Encounter for examination for admission to educational institution: Secondary | ICD-10-CM

## 2012-11-15 DIAGNOSIS — J45909 Unspecified asthma, uncomplicated: Secondary | ICD-10-CM

## 2012-11-15 DIAGNOSIS — Z0289 Encounter for other administrative examinations: Secondary | ICD-10-CM

## 2012-11-15 DIAGNOSIS — J209 Acute bronchitis, unspecified: Secondary | ICD-10-CM

## 2012-11-15 DIAGNOSIS — F988 Other specified behavioral and emotional disorders with onset usually occurring in childhood and adolescence: Secondary | ICD-10-CM

## 2012-11-15 DIAGNOSIS — Z0000 Encounter for general adult medical examination without abnormal findings: Secondary | ICD-10-CM

## 2012-11-15 MED ORDER — AMOXICILLIN-POT CLAVULANATE 875-125 MG PO TABS: 1.0000 | ORAL_TABLET | Freq: Two times a day (BID) | ORAL | Status: DC

## 2012-11-15 MED ORDER — PHENTERMINE HCL 37.5 MG PO TABS: 37.5000 mg | ORAL_TABLET | Freq: Every day | ORAL | Status: DC

## 2012-11-16 LAB — MEASLES/MUMPS/RUBELLA IMMUNITY
Mumps IgG: 50 AU/mL — ABNORMAL HIGH (ref <9.00)
Rubella: 1.5 Index — ABNORMAL HIGH (ref <0.90)

## 2012-11-16 LAB — HEPATITIS B SURFACE ANTIBODY,QUALITATIVE: Hep B S Ab: NONREACTIVE

## 2012-11-19 ENCOUNTER — Encounter: Payer: Self-pay | Admitting: Family Medicine

## 2012-11-19 DIAGNOSIS — J40 Bronchitis, not specified as acute or chronic: Secondary | ICD-10-CM | POA: Insufficient documentation

## 2012-11-19 DIAGNOSIS — J209 Acute bronchitis, unspecified: Secondary | ICD-10-CM

## 2012-11-19 HISTORY — DX: Acute bronchitis, unspecified: J20.9

## 2012-11-19 NOTE — Assessment & Plan Note (Signed)
No flare despite congestion

## 2012-11-19 NOTE — Assessment & Plan Note (Signed)
Encouraged probiotics, mucinex and increase rest and hydration. Given antibiotics if no improvement.

## 2012-11-19 NOTE — Progress Notes (Signed)
Patient ID: Kristina Watts, female   DOB: [birth year], 42 y.o.   MRN: [id number] Kristina Watts [id number] 01/12/1971 11/19/2012      Progress Note-Follow Up  Subjective  Chief Complaint  Chief Complaint  Patient presents with  . Follow-up    follow up forms for graduate school/ checkup before a 40 day trip    HPI  Patient is a 42 year old Caucasian female is in today for forms filled out and prove her immunity to required diseases with lab work. She has a son at home is sick with antibiotics on board and she is now struggling with congestion and malaise. A mild sore throat and congestion. Cough is noted in generally nonproductive. Malaise and myalgias are noted no fevers or chills. No chest pain or palpitations. No shortness of breath GI or GU concerns.  Past Medical History  Diagnosis Date  . Chicken pox as a child  . Pneumonia 1998  . Allergy     seasonal  . Asthma   . Depression   . Recurrent cold sores   . Depression with anxiety 04/18/2011  . 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 Overweight(278.02) 04/19/2011  . GDM (gestational diabetes mellitus) 04/19/2011  . History of pancreatitis 04/19/2011  . Allergic state 04/19/2011  . Preventative health care 04/19/2011  . Asthma 04/19/2011  . Tinea corporis 05/18/2011  . Fatigue 05/30/2011  . Otitis media of right ear 05/30/2011  . Dermatitis 05/18/2011  . Pharyngitis 08/23/2011  . Migraine 08/23/2011  . Pancreatitis, recurrent 12/12/2011  . Reflux 12/12/2011  . LUQ pain 12/12/2011  . Hyperlipidemia 04/27/2012  . ADD (attention deficit disorder) 04/27/2012  . Acute bronchitis 11/19/2012    Past Surgical History  Procedure Laterality Date  . Appendectomy  2007  . Cholecystectomy  2000  . Cesarean section  1-02 and 4-04    Family History  Problem Relation Age of Onset  . Thyroid disease Mother   . Hypertension Father   . Hyperlipidemia Father   . Heart disease Father   . Diabetes Father   . COPD Father      previous smoker  . Stroke Father   . Depression Father   . Restless legs syndrome Father   . Diabetes Brother   . Cancer Maternal Grandmother     Head and Neck  . Heart disease Paternal Grandmother   . Hyperlipidemia Paternal Grandmother   . Hypertension Paternal Grandmother   . Diabetes Paternal Grandmother   . Depression Paternal Grandmother   . Stroke Paternal Grandmother   . Restless legs syndrome Paternal Grandmother   . Heart disease Paternal Grandfather   . Hyperlipidemia Paternal Grandfather   . Hypertension Paternal Grandfather   . Stroke Paternal Grandfather   . Diabetes Paternal Grandfather     History   Social History  . Marital Status: Married    Spouse Name: N/A    Number of Children: N/A  . Years of Education: N/A   Occupational History  . Not on file.   Social History Main Topics  . Smoking status: Never Smoker   . Smokeless tobacco: Never Used  . Alcohol Use: Yes     Comment: rarely  . Drug Use: No  . Sexually Active: Yes -- Female partner(s)   Other Topics Concern  . Not on file   Social History Narrative  . No narrative on file    Current Outpatient Prescriptions on File Prior to Visit  Medication Sig Dispense Refill  . Acetaminophen (TYLENOL  EXTRA STRENGTH PO) Take 2 tablets by mouth as needed.      Marland Kitchen albuterol (PROVENTIL HFA;VENTOLIN HFA) 108 (90 BASE) MCG/ACT inhaler Inhale 2 puffs into the lungs as needed.        . budesonide-formoterol (SYMBICORT) 160-4.5 MCG/ACT inhaler Inhale 2 puffs into the lungs 2 (two) times daily as needed.  1 Inhaler  4  . Butalbital-APAP-Caffeine 50-300-40 MG CAPS Take 1 capsule by mouth 3 (three) times daily as needed. HA  84 capsule  1  . cetirizine (ZYRTEC) 10 MG tablet Take 10 mg by mouth daily.        . citalopram (CELEXA) 10 MG tablet Take 1 tablet (10 mg total) by mouth 2 (two) times daily.  180 tablet  1  . guaiFENesin (MUCINEX) 600 MG 12 hr tablet Take 1,200 mg by mouth 2 (two) times daily.      Marland Kitchen  ibuprofen (ADVIL,MOTRIN) 200 MG tablet Take 200 mg by mouth every 6 (six) hours as needed.      . montelukast (SINGULAIR) 10 MG tablet TAKE 1 TABLET (10 MG TOTAL) BY MOUTH AT BEDTIME.  30 tablet  3  . Multiple Vitamin (MULTIVITAMIN) tablet Take 1 tablet by mouth daily.      Marland Kitchen penciclovir (DENAVIR) 1 % cream Apply topically every 2 (two) hours.  1.5 g  1  . Probiotic Product (PROBIOTIC DAILY PO) Take by mouth.      . valACYclovir (VALTREX) 1000 MG tablet Take 1 tablet (1,000 mg total) by mouth 2 (two) times daily.  60 tablet  1   No current facility-administered medications on file prior to visit.    Allergies  Allergen Reactions  . Actifed (Triprolidine-Pse) Hives    Review of Systems  Review of Systems  Constitutional: Positive for malaise/fatigue. Negative for fever.  HENT: Positive for congestion and sore throat.   Eyes: Negative for discharge.  Respiratory: Positive for cough and sputum production. Negative for shortness of breath.   Cardiovascular: Negative for chest pain, palpitations and leg swelling.  Gastrointestinal: Negative for nausea, abdominal pain and diarrhea.  Genitourinary: Negative for dysuria.  Musculoskeletal: Negative for falls.  Skin: Negative for rash.  Neurological: Positive for headaches. Negative for loss of consciousness.  Endo/Heme/Allergies: Negative for polydipsia.  Psychiatric/Behavioral: Negative for depression and suicidal ideas. The patient is not nervous/anxious and does not have insomnia.     Objective  BP 102/72  Pulse 90  Temp(Src) 98.2 F (36.8 C) (Oral)  Ht 5\' 5"  (1.651 m)  Wt 199 lb 0.6 oz (90.284 kg)  BMI 33.12 kg/m2  SpO2 96%  LMP 10/27/2012  Physical Exam  Physical Exam  Constitutional: She is oriented to person, place, and time and well-developed, well-nourished, and in no distress. No distress.  HENT:  Head: Normocephalic and atraumatic.  Eyes: Conjunctivae are normal.  Neck: Neck supple. No thyromegaly present.   Cardiovascular: Normal rate, regular rhythm and normal heart sounds.   No murmur heard. Pulmonary/Chest: Effort normal and breath sounds normal. She has no wheezes.  Abdominal: She exhibits no distension and no mass.  Musculoskeletal: She exhibits no edema.  Lymphadenopathy:    She has no cervical adenopathy.  Neurological: She is alert and oriented to person, place, and time.  Skin: Skin is warm and dry. No rash noted. She is not diaphoretic.  Psychiatric: Memory, affect and judgment normal.    Lab Results  Component Value Date   TSH 1.04 04/26/2012   Lab Results  Component Value Date   WBC  5.5 04/26/2012   HGB 13.3 04/26/2012   HCT 39.6 04/26/2012   MCV 92.4 04/26/2012   PLT 251.0 04/26/2012   Lab Results  Component Value Date   CREATININE 0.8 04/26/2012   BUN 8 04/26/2012   NA 138 04/26/2012   K 4.3 04/26/2012   CL 102 04/26/2012   CO2 30 04/26/2012   Lab Results  Component Value Date   ALT 15 04/26/2012   AST 16 04/26/2012   ALKPHOS 56 04/26/2012   BILITOT 0.5 04/26/2012   Lab Results  Component Value Date   CHOL 190 04/26/2012   Lab Results  Component Value Date   HDL 43.40 04/26/2012   Lab Results  Component Value Date   LDLCALC 120* 04/26/2012   Lab Results  Component Value Date   TRIG 131.0 04/26/2012   Lab Results  Component Value Date   CHOLHDL 4 04/26/2012     Assessment & Plan  Acute bronchitis Encouraged probiotics, mucinex and increase rest and hydration. Given antibiotics if no improvement.  Asthma No flare despite congestion  ADD (attention deficit disorder) Will consider trial of Ritalin when she returns from a summer vacation  Overweight(278.02) Encouraged DASH diet and increased exercise, given refills on Phentermine to use until she returns from her vacation.   Preventative health care Labs show immunity to the diseases she is needs to prove to apply to graduate school

## 2012-11-19 NOTE — Assessment & Plan Note (Signed)
Labs show immunity to the diseases she is needs to prove to apply to graduate school

## 2012-11-19 NOTE — Assessment & Plan Note (Signed)
Encouraged DASH diet and increased exercise, given refills on Phentermine to use until she returns from her vacation.

## 2012-11-19 NOTE — Assessment & Plan Note (Signed)
Will consider trial of Ritalin when she returns from a summer vacation

## 2012-11-23 LAB — ECHOVIRUS ABS PANEL (CSF)
Echovirus Ab Type 11: <1:10 {titer}
Echovirus Ab Type 30: <1:10 {titer}
Echovirus Ab Type 7: <1:10 {titer}
Echovirus Ab Type 9: 1:20 {titer}

## 2012-11-27 ENCOUNTER — Encounter: Payer: Self-pay | Admitting: Family Medicine

## 2012-11-27 NOTE — Telephone Encounter (Signed)
Waiting on polio results

## 2012-11-28 LAB — POLIOVIRUS ANTIBODIES, TYPES 1, 2, AND 3
Poliovirus Type 2 Antibodies: 1:32 {titer}
Poliovirus Type 3 Antibodies: 1:32 {titer}

## 2013-02-01 ENCOUNTER — Ambulatory Visit (INDEPENDENT_AMBULATORY_CARE_PROVIDER_SITE_OTHER): Payer: Managed Care, Other (non HMO) | Admitting: Family Medicine

## 2013-02-01 ENCOUNTER — Encounter: Payer: Self-pay | Admitting: Family Medicine

## 2013-02-01 VITALS — BP 102/78 | HR 78 | Temp 97.8°F | Ht 65.0 in | Wt 197.0 lb

## 2013-02-01 DIAGNOSIS — J45909 Unspecified asthma, uncomplicated: Secondary | ICD-10-CM

## 2013-02-01 DIAGNOSIS — F988 Other specified behavioral and emotional disorders with onset usually occurring in childhood and adolescence: Secondary | ICD-10-CM

## 2013-02-01 MED ORDER — AMPHETAMINE-DEXTROAMPHETAMINE 10 MG PO TABS: 10.0000 mg | ORAL_TABLET | Freq: Two times a day (BID) | ORAL | Status: DC

## 2013-02-01 NOTE — Patient Instructions (Addendum)
Attention Deficit Hyperactivity Disorder Attention deficit hyperactivity disorder (ADHD) is a problem with behavior issues based on the way the brain functions (neurobehavioral disorder). It is a common reason for behavior and academic problems in school. CAUSES  The cause of ADHD is unknown in most cases. It may run in families. It sometimes can be associated with learning disabilities and other behavioral problems. SYMPTOMS  There are 3 types of ADHD. The 3 types and some of the symptoms include:  Inattentive  Gets bored or distracted easily.  Loses or forgets things. Forgets to hand in homework.  Has trouble organizing or completing tasks.  Difficulty staying on task.  An inability to organize daily tasks and school work.  Leaving projects, chores, or homework unfinished.  Trouble paying attention or responding to details. Careless mistakes.  Difficulty following directions. Often seems like is not listening.  Dislikes activities that require sustained attention (like chores or homework).  Hyperactive-impulsive  Feels like it is impossible to sit still or stay in a seat. Fidgeting with hands and feet.  Trouble waiting turn.  Talking too much or out of turn. Interruptive.  Speaks or acts impulsively.  Aggressive, disruptive behavior.  Constantly busy or on the go, noisy.  Combined  Has symptoms of both of the above. Often children with ADHD feel discouraged about themselves and with school. They often perform well below their abilities in school. These symptoms can cause problems in home, school, and in relationships with peers. As children get older, the excess motor activities can calm down, but the problems with paying attention and staying organized persist. Most children do not outgrow ADHD but with good treatment can learn to cope with the symptoms. DIAGNOSIS  When ADHD is suspected, the diagnosis should be made by professionals trained in ADHD.  Diagnosis will  include:  Ruling out other reasons for the child's behavior.  The caregivers will check with the child's school and check their medical records.  They will talk to teachers and parents.  Behavior rating scales for the child will be filled out by those dealing with the child on a daily basis. A diagnosis is made only after all information has been considered. TREATMENT  Treatment usually includes behavioral treatment often along with medicines. It may include stimulant medicines. The stimulant medicines decrease impulsivity and hyperactivity and increase attention. Other medicines used include antidepressants and certain blood pressure medicines. Most experts agree that treatment for ADHD should address all aspects of the child's functioning. Treatment should not be limited to the use of medicines alone. Treatment should include structured classroom management. The parents must receive education to address rewarding good behavior, discipline, and limit-setting. Tutoring or behavioral therapy or both should be available for the child. If untreated, the disorder can have long-term serious effects into adolescence and adulthood. HOME CARE INSTRUCTIONS   Often with ADHD there is a lot of frustration among the family in dealing with the illness. There is often blame and anger that is not warranted. This is a life long illness. There is no way to prevent ADHD. In many cases, because the problem affects the family as a whole, the entire family may need help. A therapist can help the family find better ways to handle the disruptive behaviors and promote change. If the child is young, most of the therapist's work is with the parents. Parents will learn techniques for coping with and improving their child's behavior. Sometimes only the child with the ADHD needs counseling. Your caregivers can help   you make these decisions.  Children with ADHD may need help in organizing. Some helpful tips include:  Keep  routines the same every day from wake-up time to bedtime. Schedule everything. This includes homework and playtime. This should include outdoor and indoor recreation. Keep the schedule on the refrigerator or a bulletin board where it is frequently seen. Mark schedule changes as far in advance as possible.  Have a place for everything and keep everything in its place. This includes clothing, backpacks, and school supplies.  Encourage writing down assignments and bringing home needed books.  Offer your child a well-balanced diet. Breakfast is especially important for school performance. Children should avoid drinks with caffeine including:  Soft drinks.  Coffee.  Tea.  However, some older children (adolescents) may find these drinks helpful in improving their attention.  Children with ADHD need consistent rules that they can understand and follow. If rules are followed, give small rewards. Children with ADHD often receive, and expect, criticism. Look for good behavior and praise it. Set realistic goals. Give clear instructions. Look for activities that can foster success and self-esteem. Make time for pleasant activities with your child. Give lots of affection.  Parents are their children's greatest advocates. Learn as much as possible about ADHD. This helps you become a stronger and better advocate for your child. It also helps you educate your child's teachers and instructors if they feel inadequate in these areas. Parent support groups are often helpful. A national group with local chapters is called CHADD (Children and Adults with Attention Deficit Hyperactivity Disorder). PROGNOSIS  There is no cure for ADHD. Children with the disorder seldom outgrow it. Many find adaptive ways to accommodate the ADHD as they mature. SEEK MEDICAL CARE IF:  Your child has repeated muscle twitches, cough or speech outbursts.  Your child has sleep problems.  Your child has a marked loss of  appetite.  Your child develops depression.  Your child has new or worsening behavioral problems.  Your child develops dizziness.  Your child has a racing heart.  Your child has stomach pains.  Your child develops headaches. Document Released: 05/27/2002 Document Revised: 08/29/2011 Document Reviewed: 01/07/2008 Exit[employer] Patient Information 2014 [employer].  

## 2013-02-03 NOTE — Progress Notes (Signed)
Patient ID: Kristina Watts, female   DOB: 1971/06/16, 42 y.o.   MRN: [id number] JENNAVIEVE ARRICK [id number] 10/24/1970 02/03/2013      Progress Note-Follow Up  Subjective  Chief Complaint  Chief Complaint  Patient presents with  . Follow-up    3 month    HPI  Patient is a 42 year old Caucasian female who is in today for followup. Since his back problem cross-country road trip and had a great time with her family. Is getting ready for the school year and is interested in starting medications for ADD which we have previously diagnosed. She has not had an recent illness. She denies headaches, palpitations, shortness of breath, GI or GU complaints. Asthma and allergies are generally well-controlled.  Past Medical History  Diagnosis Date  . Chicken pox as a child  . Pneumonia 1998  . Allergy     seasonal  . Asthma   . Depression   . Recurrent cold sores   . Depression with anxiety 04/18/2011  . 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 Overweight(278.02) 04/19/2011  . GDM (gestational diabetes mellitus) 04/19/2011  . History of pancreatitis 04/19/2011  . Allergic state 04/19/2011  . Preventative health care 04/19/2011  . Asthma 04/19/2011  . Tinea corporis 05/18/2011  . Fatigue 05/30/2011  . Otitis media of right ear 05/30/2011  . Dermatitis 05/18/2011  . Pharyngitis 08/23/2011  . Migraine 08/23/2011  . Pancreatitis, recurrent 12/12/2011  . Reflux 12/12/2011  . LUQ pain 12/12/2011  . Hyperlipidemia 04/27/2012  . ADD (attention deficit disorder) 04/27/2012  . Acute bronchitis 11/19/2012    Past Surgical History  Procedure Laterality Date  . Appendectomy  2007  . Cholecystectomy  2000  . Cesarean section  1-02 and 4-04    Family History  Problem Relation Age of Onset  . Thyroid disease Mother   . Hypertension Father   . Hyperlipidemia Father   . Heart disease Father   . Diabetes Father   . COPD Father     previous smoker  . Stroke Father   . Depression Father    . Restless legs syndrome Father   . Diabetes Brother   . Cancer Maternal Grandmother     Head and Neck  . Heart disease Paternal Grandmother   . Hyperlipidemia Paternal Grandmother   . Hypertension Paternal Grandmother   . Diabetes Paternal Grandmother   . Depression Paternal Grandmother   . Stroke Paternal Grandmother   . Restless legs syndrome Paternal Grandmother   . Heart disease Paternal Grandfather   . Hyperlipidemia Paternal Grandfather   . Hypertension Paternal Grandfather   . Stroke Paternal Grandfather   . Diabetes Paternal Grandfather     History   Social History  . Marital Status: Married    Spouse Name: N/A    Number of Children: N/A  . Years of Education: N/A   Occupational History  . Not on file.   Social History Main Topics  . Smoking status: Never Smoker   . Smokeless tobacco: Never Used  . Alcohol Use: Yes     Comment: rarely  . Drug Use: No  . Sexual Activity: Yes    Partners: Male   Other Topics Concern  . Not on file   Social History Narrative  . No narrative on file    Current Outpatient Prescriptions on File Prior to Visit  Medication Sig Dispense Refill  . citalopram (CELEXA) 10 MG tablet Take 1 tablet (10 mg total) by mouth 2 (two) times daily.  180 tablet  1  . montelukast (SINGULAIR) 10 MG tablet TAKE 1 TABLET (10 MG TOTAL) BY MOUTH AT BEDTIME.  30 tablet  3  . Acetaminophen (TYLENOL EXTRA STRENGTH PO) Take 2 tablets by mouth as needed.      Marland Kitchen albuterol (PROVENTIL HFA;VENTOLIN HFA) 108 (90 BASE) MCG/ACT inhaler Inhale 2 puffs into the lungs as needed.        . budesonide-formoterol (SYMBICORT) 160-4.5 MCG/ACT inhaler Inhale 2 puffs into the lungs 2 (two) times daily as needed.  1 Inhaler  4  . Butalbital-APAP-Caffeine 50-300-40 MG CAPS Take 1 capsule by mouth 3 (three) times daily as needed. HA  84 capsule  1  . ibuprofen (ADVIL,MOTRIN) 200 MG tablet Take 200 mg by mouth every 6 (six) hours as needed.      . Multiple Vitamin  (MULTIVITAMIN) tablet Take 1 tablet by mouth daily.      Marland Kitchen penciclovir (DENAVIR) 1 % cream Apply topically every 2 (two) hours.  1.5 g  1  . valACYclovir (VALTREX) 1000 MG tablet Take 1 tablet (1,000 mg total) by mouth 2 (two) times daily.  60 tablet  1   No current facility-administered medications on file prior to visit.    Allergies  Allergen Reactions  . Actifed [Triprolidine-Pse] Hives    Review of Systems  Review of Systems  Constitutional: Positive for malaise/fatigue. Negative for fever.  HENT: Negative for congestion.   Eyes: Negative for discharge.  Respiratory: Negative for shortness of breath.   Cardiovascular: Negative for chest pain, palpitations and leg swelling.  Gastrointestinal: Negative for nausea, abdominal pain and diarrhea.  Genitourinary: Negative for dysuria.  Musculoskeletal: Negative for falls.  Skin: Negative for rash.  Neurological: Negative for loss of consciousness and headaches.  Endo/Heme/Allergies: Negative for polydipsia.  Psychiatric/Behavioral: Negative for depression and suicidal ideas. The patient is not nervous/anxious and does not have insomnia.     Objective  BP 102/78  Pulse 78  Temp(Src) 97.8 F (36.6 C) (Oral)  Ht 5\' 5"  (1.651 m)  Wt 197 lb 0.6 oz (89.377 kg)  BMI 32.79 kg/m2  SpO2 97%  LMP 01/18/2013  Physical Exam  Physical Exam  Constitutional: She is oriented to person, place, and time and well-developed, well-nourished, and in no distress. No distress.  HENT:  Head: Normocephalic and atraumatic.  Eyes: Conjunctivae are normal.  Neck: Neck supple. No thyromegaly present.  Cardiovascular: Normal rate, regular rhythm and normal heart sounds.   No murmur heard. Pulmonary/Chest: Effort normal and breath sounds normal. She has no wheezes.  Abdominal: She exhibits no distension and no mass.  Musculoskeletal: She exhibits no edema.  Lymphadenopathy:    She has no cervical adenopathy.  Neurological: She is alert and  oriented to person, place, and time.  Skin: Skin is warm and dry. No rash noted. She is not diaphoretic.  Psychiatric: Memory, affect and judgment normal.    Lab Results  Component Value Date   TSH 1.04 04/26/2012   Lab Results  Component Value Date   WBC 5.5 04/26/2012   HGB 13.3 04/26/2012   HCT 39.6 04/26/2012   MCV 92.4 04/26/2012   PLT 251.0 04/26/2012   Lab Results  Component Value Date   CREATININE 0.8 04/26/2012   BUN 8 04/26/2012   NA 138 04/26/2012   K 4.3 04/26/2012   CL 102 04/26/2012   CO2 30 04/26/2012   Lab Results  Component Value Date   ALT 15 04/26/2012   AST 16 04/26/2012   ALKPHOS  56 04/26/2012   BILITOT 0.5 04/26/2012   Lab Results  Component Value Date   CHOL 190 04/26/2012   Lab Results  Component Value Date   HDL 43.40 04/26/2012   Lab Results  Component Value Date   LDLCALC 120* 04/26/2012   Lab Results  Component Value Date   TRIG 131.0 04/26/2012   Lab Results  Component Value Date   CHOLHDL 4 04/26/2012     Assessment & Plan  ADD (attention deficit disorder) Will allow Adderall  10 mg bid and reassess at next visit.  Asthma Well controlled on current meds.

## 2013-02-03 NOTE — Assessment & Plan Note (Signed)
Well-controlled on current meds 

## 2013-02-03 NOTE — Assessment & Plan Note (Signed)
Will allow Adderall  10 mg bid and reassess at next visit.

## 2013-02-08 ENCOUNTER — Other Ambulatory Visit: Payer: Self-pay | Admitting: *Deleted

## 2013-02-08 ENCOUNTER — Telehealth: Payer: Self-pay

## 2013-02-08 MED ORDER — MONTELUKAST SODIUM 10 MG PO TABS: ORAL_TABLET | ORAL | Status: DC

## 2013-02-08 NOTE — Telephone Encounter (Signed)
Rx request to pharmacy/[facility]  

## 2013-02-08 NOTE — Telephone Encounter (Signed)
pts spouse sent the following message on his mychart:  Would you please see if you have any records that Kristina Watts has had any Tetinus shots? She has to prove that she has at least 3 shots in her lifetime. Even if we state that she had one with each C-Section(2002/2004), and one with appendisitus (2007) removal. Thanks, Onalee Hua   I see one documented on 11-19-2006

## 2013-02-14 ENCOUNTER — Other Ambulatory Visit: Payer: Self-pay | Admitting: Family Medicine

## 2013-03-06 ENCOUNTER — Encounter: Payer: Self-pay | Admitting: Family Medicine

## 2013-03-06 ENCOUNTER — Ambulatory Visit (INDEPENDENT_AMBULATORY_CARE_PROVIDER_SITE_OTHER): Payer: Managed Care, Other (non HMO) | Admitting: Family Medicine

## 2013-03-06 VITALS — BP 104/72 | HR 76 | Temp 98.1°F | Ht 65.0 in | Wt 200.1 lb

## 2013-03-06 DIAGNOSIS — G43909 Migraine, unspecified, not intractable, without status migrainosus: Secondary | ICD-10-CM

## 2013-03-06 DIAGNOSIS — Z23 Encounter for immunization: Secondary | ICD-10-CM

## 2013-03-06 DIAGNOSIS — F988 Other specified behavioral and emotional disorders with onset usually occurring in childhood and adolescence: Secondary | ICD-10-CM

## 2013-03-06 MED ORDER — AMPHETAMINE-DEXTROAMPHETAMINE 10 MG PO TABS: 10.0000 mg | ORAL_TABLET | Freq: Every day | ORAL | Status: DC | PRN

## 2013-03-06 MED ORDER — AMPHETAMINE-DEXTROAMPHET ER 30 MG PO CP24: 30.0000 mg | ORAL_CAPSULE | ORAL | Status: DC

## 2013-03-06 NOTE — Progress Notes (Signed)
Patient ID: Kristina Watts, female   DOB: 1970/09/17, 42 y.o.   MRN: [id number] Kristina Watts [id number] [birth year] 03/06/2013      Progress Note-Follow Up  Subjective  Chief Complaint  Chief Complaint  Patient presents with  . Follow-up    4 week  . Injections    flu    HPI  Caucasian female who is in today for followup on her ADD. She notes that the 10 mg has been helpful but only lasted a short time. No worsening headache over she has had one migraine since last seen. No chest pain, palpitations, shortness of breath, GI or GU concerns noted at this time  Past Medical History  Diagnosis Date  . Chicken pox as a child  . Pneumonia 1998  . Allergy     seasonal  . Asthma   . Depression   . Recurrent cold sores   . Depression with anxiety 04/18/2011  . 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 Overweight(278.02) 04/19/2011  . GDM (gestational diabetes mellitus) 04/19/2011  . History of pancreatitis 04/19/2011  . Allergic state 04/19/2011  . Preventative health care 04/19/2011  . Asthma 04/19/2011  . Tinea corporis 05/18/2011  . Fatigue 05/30/2011  . Otitis media of right ear 05/30/2011  . Dermatitis 05/18/2011  . Pharyngitis 08/23/2011  . Migraine 08/23/2011  . Pancreatitis, recurrent 12/12/2011  . Reflux 12/12/2011  . LUQ pain 12/12/2011  . Hyperlipidemia 04/27/2012  . ADD (attention deficit disorder) 04/27/2012  . Acute bronchitis 11/19/2012    Past Surgical History  Procedure Laterality Date  . Appendectomy  2007  . Cholecystectomy  2000  . Cesarean section  1-02 and 4-04    Family History  Problem Relation Age of Onset  . Thyroid disease Mother   . Hypertension Father   . Hyperlipidemia Father   . Heart disease Father   . Diabetes Father   . COPD Father     previous smoker  . Stroke Father   . Depression Father   . Restless legs syndrome Father   . Diabetes Brother   . Cancer Maternal Grandmother     Head and Neck  . Heart disease Paternal  Grandmother   . Hyperlipidemia Paternal Grandmother   . Hypertension Paternal Grandmother   . Diabetes Paternal Grandmother   . Depression Paternal Grandmother   . Stroke Paternal Grandmother   . Restless legs syndrome Paternal Grandmother   . Heart disease Paternal Grandfather   . Hyperlipidemia Paternal Grandfather   . Hypertension Paternal Grandfather   . Stroke Paternal Grandfather   . Diabetes Paternal Grandfather     History   Social History  . Marital Status: Married    Spouse Name: N/A    Number of Children: N/A  . Years of Education: N/A   Occupational History  . Not on file.   Social History Main Topics  . Smoking status: Never Smoker   . Smokeless tobacco: Never Used  . Alcohol Use: Yes     Comment: rarely  . Drug Use: No  . Sexual Activity: Yes    Partners: Male   Other Topics Concern  . Not on file   Social History Narrative  . No narrative on file    Current Outpatient Prescriptions on File Prior to Visit  Medication Sig Dispense Refill  . Acetaminophen (TYLENOL EXTRA STRENGTH PO) Take 2 tablets by mouth as needed.      Marland Kitchen albuterol (PROVENTIL HFA;VENTOLIN HFA) 108 (90 BASE) MCG/ACT inhaler Inhale  2 puffs into the lungs as needed.        . budesonide-formoterol (SYMBICORT) 160-4.5 MCG/ACT inhaler Inhale 2 puffs into the lungs 2 (two) times daily as needed.  1 Inhaler  4  . Butalbital-APAP-Caffeine 50-300-40 MG CAPS Take 1 capsule by mouth 3 (three) times daily as needed. HA  84 capsule  1  . citalopram (CELEXA) 10 MG tablet Take 1 tablet (10 mg total) by mouth 2 (two) times daily.  180 tablet  1  . ibuprofen (ADVIL,MOTRIN) 200 MG tablet Take 200 mg by mouth every 6 (six) hours as needed.      . montelukast (SINGULAIR) 10 MG tablet TAKE 1 TABLET (10 MG TOTAL) BY MOUTH AT BEDTIME.  30 tablet  3  . Multiple Vitamin (MULTIVITAMIN) tablet Take 1 tablet by mouth daily.      Marland Kitchen penciclovir (DENAVIR) 1 % cream Apply topically every 2 (two) hours.  1.5 g  1  .  valACYclovir (VALTREX) 1000 MG tablet TAKE 1 TABLET BY MOUTH TWICE A DAY  60 tablet  1   No current facility-administered medications on file prior to visit.    Allergies  Allergen Reactions  . Actifed [Triprolidine-Pse] Hives    Review of Systems  Review of Systems  Constitutional: Negative for fever and malaise/fatigue.  HENT: Negative for congestion.   Eyes: Negative for discharge.  Respiratory: Negative for shortness of breath.   Cardiovascular: Negative for chest pain, palpitations and leg swelling.  Gastrointestinal: Negative for nausea, abdominal pain and diarrhea.  Genitourinary: Negative for dysuria.  Musculoskeletal: Negative for falls.  Skin: Negative for rash.  Neurological: Negative for loss of consciousness and headaches.  Endo/Heme/Allergies: Negative for polydipsia.  Psychiatric/Behavioral: Negative for depression and suicidal ideas. The patient is not nervous/anxious and does not have insomnia.     Objective  BP 104/72  Pulse 76  Temp(Src) 98.1 F (36.7 C) (Oral)  Ht 5\' 5"  (1.651 m)  Wt 200 lb 1.3 oz (90.756 kg)  BMI 33.3 kg/m2  SpO2 98%  LMP 02/13/2013  Physical Exam  Physical Exam  Constitutional: She is oriented to person, place, and time and well-developed, well-nourished, and in no distress. No distress.  HENT:  Head: Normocephalic and atraumatic.  Eyes: Conjunctivae are normal.  Neck: Neck supple. No thyromegaly present.  Cardiovascular: Normal rate, regular rhythm and normal heart sounds.   No murmur heard. Pulmonary/Chest: Effort normal and breath sounds normal. She has no wheezes.  Abdominal: She exhibits no distension and no mass.  Musculoskeletal: She exhibits no edema.  Lymphadenopathy:    She has no cervical adenopathy.  Neurological: She is alert and oriented to person, place, and time.  Skin: Skin is warm and dry. No rash noted. She is not diaphoretic.  Psychiatric: Memory, affect and judgment normal.    Lab Results   Component Value Date   TSH 1.04 04/26/2012   Lab Results  Component Value Date   WBC 5.5 04/26/2012   HGB 13.3 04/26/2012   HCT 39.6 04/26/2012   MCV 92.4 04/26/2012   PLT 251.0 04/26/2012   Lab Results  Component Value Date   CREATININE 0.8 04/26/2012   BUN 8 04/26/2012   NA 138 04/26/2012   K 4.3 04/26/2012   CL 102 04/26/2012   CO2 30 04/26/2012   Lab Results  Component Value Date   ALT 15 04/26/2012   AST 16 04/26/2012   ALKPHOS 56 04/26/2012   BILITOT 0.5 04/26/2012   Lab Results  Component Value Date  CHOL 190 04/26/2012   Lab Results  Component Value Date   HDL 43.40 04/26/2012   Lab Results  Component Value Date   LDLCALC 120* 04/26/2012   Lab Results  Component Value Date   TRIG 131.0 04/26/2012   Lab Results  Component Value Date   CHOLHDL 4 04/26/2012     Assessment & Plan  Migraine Not flared with Adderall  ADD (attention deficit disorder) No difficulty with med but wore off to quickly. Will switch to Adderall XR 30 mg and can use short acting 10 mg in afternoon prn

## 2013-03-06 NOTE — Patient Instructions (Addendum)
Attention Deficit Hyperactivity Disorder Attention deficit hyperactivity disorder (ADHD) is a problem with behavior issues based on the way the brain functions (neurobehavioral disorder). It is a common reason for behavior and academic problems in school. CAUSES  The cause of ADHD is unknown in most cases. It may run in families. It sometimes can be associated with learning disabilities and other behavioral problems. SYMPTOMS  There are 3 types of ADHD. The 3 types and some of the symptoms include:  Inattentive  Gets bored or distracted easily.  Loses or forgets things. Forgets to hand in homework.  Has trouble organizing or completing tasks.  Difficulty staying on task.  An inability to organize daily tasks and school work.  Leaving projects, chores, or homework unfinished.  Trouble paying attention or responding to details. Careless mistakes.  Difficulty following directions. Often seems like is not listening.  Dislikes activities that require sustained attention (like chores or homework).  Hyperactive-impulsive  Feels like it is impossible to sit still or stay in a seat. Fidgeting with hands and feet.  Trouble waiting turn.  Talking too much or out of turn. Interruptive.  Speaks or acts impulsively.  Aggressive, disruptive behavior.  Constantly busy or on the go, noisy.  Combined  Has symptoms of both of the above. Often children with ADHD feel discouraged about themselves and with school. They often perform well below their abilities in school. These symptoms can cause problems in home, school, and in relationships with peers. As children get older, the excess motor activities can calm down, but the problems with paying attention and staying organized persist. Most children do not outgrow ADHD but with good treatment can learn to cope with the symptoms. DIAGNOSIS  When ADHD is suspected, the diagnosis should be made by professionals trained in ADHD.  Diagnosis will  include:  Ruling out other reasons for the child's behavior.  The caregivers will check with the child's school and check their medical records.  They will talk to teachers and parents.  Behavior rating scales for the child will be filled out by those dealing with the child on a daily basis. A diagnosis is made only after all information has been considered. TREATMENT  Treatment usually includes behavioral treatment often along with medicines. It may include stimulant medicines. The stimulant medicines decrease impulsivity and hyperactivity and increase attention. Other medicines used include antidepressants and certain blood pressure medicines. Most experts agree that treatment for ADHD should address all aspects of the child's functioning. Treatment should not be limited to the use of medicines alone. Treatment should include structured classroom management. The parents must receive education to address rewarding good behavior, discipline, and limit-setting. Tutoring or behavioral therapy or both should be available for the child. If untreated, the disorder can have long-term serious effects into adolescence and adulthood. HOME CARE INSTRUCTIONS   Often with ADHD there is a lot of frustration among the family in dealing with the illness. There is often blame and anger that is not warranted. This is a life long illness. There is no way to prevent ADHD. In many cases, because the problem affects the family as a whole, the entire family may need help. A therapist can help the family find better ways to handle the disruptive behaviors and promote change. If the child is young, most of the therapist's work is with the parents. Parents will learn techniques for coping with and improving their child's behavior. Sometimes only the child with the ADHD needs counseling. Your caregivers can help   you make these decisions.  Children with ADHD may need help in organizing. Some helpful tips include:  Keep  routines the same every day from wake-up time to bedtime. Schedule everything. This includes homework and playtime. This should include outdoor and indoor recreation. Keep the schedule on the refrigerator or a bulletin board where it is frequently seen. Mark schedule changes as far in advance as possible.  Have a place for everything and keep everything in its place. This includes clothing, backpacks, and school supplies.  Encourage writing down assignments and bringing home needed books.  Offer your child a well-balanced diet. Breakfast is especially important for school performance. Children should avoid drinks with caffeine including:  Soft drinks.  Coffee.  Tea.  However, some older children (adolescents) may find these drinks helpful in improving their attention.  Children with ADHD need consistent rules that they can understand and follow. If rules are followed, give small rewards. Children with ADHD often receive, and expect, criticism. Look for good behavior and praise it. Set realistic goals. Give clear instructions. Look for activities that can foster success and self-esteem. Make time for pleasant activities with your child. Give lots of affection.  Parents are their children's greatest advocates. Learn as much as possible about ADHD. This helps you become a stronger and better advocate for your child. It also helps you educate your child's teachers and instructors if they feel inadequate in these areas. Parent support groups are often helpful. A national group with local chapters is called CHADD (Children and Adults with Attention Deficit Hyperactivity Disorder). PROGNOSIS  There is no cure for ADHD. Children with the disorder seldom outgrow it. Many find adaptive ways to accommodate the ADHD as they mature. SEEK MEDICAL CARE IF:  Your child has repeated muscle twitches, cough or speech outbursts.  Your child has sleep problems.  Your child has a marked loss of  appetite.  Your child develops depression.  Your child has new or worsening behavioral problems.  Your child develops dizziness.  Your child has a racing heart.  Your child has stomach pains.  Your child develops headaches. Document Released: 05/27/2002 Document Revised: 08/29/2011 Document Reviewed: 01/07/2008 Exit[employer] Patient Information 2014 [employer].  

## 2013-03-09 NOTE — Assessment & Plan Note (Signed)
No difficulty with med but wore off to quickly. Will switch to Adderall XR 30 mg and can use short acting 10 mg in afternoon prn

## 2013-03-09 NOTE — Assessment & Plan Note (Signed)
Not flared with Adderall

## 2013-04-05 ENCOUNTER — Ambulatory Visit (INDEPENDENT_AMBULATORY_CARE_PROVIDER_SITE_OTHER): Payer: Managed Care, Other (non HMO) | Admitting: Family Medicine

## 2013-04-05 ENCOUNTER — Encounter: Payer: Self-pay | Admitting: Family Medicine

## 2013-04-05 VITALS — BP 128/82 | HR 84 | Temp 98.7°F | Ht 65.0 in | Wt 197.5 lb

## 2013-04-05 DIAGNOSIS — F988 Other specified behavioral and emotional disorders with onset usually occurring in childhood and adolescence: Secondary | ICD-10-CM

## 2013-04-05 MED ORDER — AMPHETAMINE-DEXTROAMPHETAMINE 10 MG PO TABS: 10.0000 mg | ORAL_TABLET | Freq: Every day | ORAL | Status: DC | PRN

## 2013-04-05 MED ORDER — AMPHETAMINE-DEXTROAMPHET ER 30 MG PO CP24: 30.0000 mg | ORAL_CAPSULE | ORAL | Status: DC

## 2013-04-05 NOTE — Assessment & Plan Note (Addendum)
Has had good response to Adderall XR at 30 mg most days, has only needed pm dose of short acting adderall. No concerning side effects identified

## 2013-04-05 NOTE — Progress Notes (Signed)
Patient ID: Kristina Watts, female   DOB: 07/10/1970, 42 y.o.   MRN: [id number] EUGENE ZEIDERS [id number] 1970/10/07 04/05/2013      Progress Note-Follow Up  Subjective  Chief Complaint  Chief Complaint  Patient presents with  . Follow-up    4 wk to discuss medication    HPI  Patient is a 42 year old Caucasian female who is in today for evaluation of her response to Adderall. She's feeling well. She notes the 30 mg extended-release seems to be helping. She is able to get her work done at school manage her family and get social work done. As needed breakthrough short acting Adderall in the afternoon very infrequently. She finds she is focusing better and is less tired. Denies any increase in headaches although she has had an occasional headache. Denies any chest pain, palpitations, shortness of breath, GI or GU c/o  Past Medical History  Diagnosis Date  . Chicken pox as a child  . Pneumonia 1998  . Allergy     seasonal  . Asthma   . Depression   . Recurrent cold sores   . Depression with anxiety 04/18/2011  . 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 Overweight(278.02) 04/19/2011  . GDM (gestational diabetes mellitus) 04/19/2011  . History of pancreatitis 04/19/2011  . Allergic state 04/19/2011  . Preventative health care 04/19/2011  . Asthma 04/19/2011  . Tinea corporis 05/18/2011  . Fatigue 05/30/2011  . Otitis media of right ear 05/30/2011  . Dermatitis 05/18/2011  . Pharyngitis 08/23/2011  . Migraine 08/23/2011  . Pancreatitis, recurrent 12/12/2011  . Reflux 12/12/2011  . LUQ pain 12/12/2011  . Hyperlipidemia 04/27/2012  . ADD (attention deficit disorder) 04/27/2012  . Acute bronchitis 11/19/2012    Past Surgical History  Procedure Laterality Date  . Appendectomy  2007  . Cholecystectomy  2000  . Cesarean section  1-02 and 4-04    Family History  Problem Relation Age of Onset  . Thyroid disease Mother   . Hypertension Father   . Hyperlipidemia Father    . Heart disease Father   . Diabetes Father   . COPD Father     previous smoker  . Stroke Father   . Depression Father   . Restless legs syndrome Father   . Diabetes Brother   . Cancer Maternal Grandmother     Head and Neck  . Heart disease Paternal Grandmother   . Hyperlipidemia Paternal Grandmother   . Hypertension Paternal Grandmother   . Diabetes Paternal Grandmother   . Depression Paternal Grandmother   . Stroke Paternal Grandmother   . Restless legs syndrome Paternal Grandmother   . Heart disease Paternal Grandfather   . Hyperlipidemia Paternal Grandfather   . Hypertension Paternal Grandfather   . Stroke Paternal Grandfather   . Diabetes Paternal Grandfather     History   Social History  . Marital Status: Married    Spouse Name: N/A    Number of Children: N/A  . Years of Education: N/A   Occupational History  . Not on file.   Social History Main Topics  . Smoking status: Never Smoker   . Smokeless tobacco: Never Used  . Alcohol Use: Yes     Comment: rarely  . Drug Use: No  . Sexual Activity: Yes    Partners: Male   Other Topics Concern  . Not on file   Social History Narrative  . No narrative on file    Current Outpatient Prescriptions on File Prior to  Visit  Medication Sig Dispense Refill  . Acetaminophen (TYLENOL EXTRA STRENGTH PO) Take 2 tablets by mouth as needed.      Marland Kitchen albuterol (PROVENTIL HFA;VENTOLIN HFA) 108 (90 BASE) MCG/ACT inhaler Inhale 2 puffs into the lungs as needed.        Marland Kitchen amphetamine-dextroamphetamine (ADDERALL XR) 30 MG 24 hr capsule Take 1 capsule (30 mg total) by mouth every morning.  30 capsule  0  . amphetamine-dextroamphetamine (ADDERALL) 10 MG tablet Take 1 tablet (10 mg total) by mouth daily as needed. In pm  30 tablet  0  . budesonide-formoterol (SYMBICORT) 160-4.5 MCG/ACT inhaler Inhale 2 puffs into the lungs 2 (two) times daily as needed.  1 Inhaler  4  . Butalbital-APAP-Caffeine 50-300-40 MG CAPS Take 1 capsule by mouth  3 (three) times daily as needed. HA  84 capsule  1  . citalopram (CELEXA) 10 MG tablet Take 1 tablet (10 mg total) by mouth 2 (two) times daily.  180 tablet  1  . ibuprofen (ADVIL,MOTRIN) 200 MG tablet Take 200 mg by mouth every 6 (six) hours as needed.      . montelukast (SINGULAIR) 10 MG tablet TAKE 1 TABLET (10 MG TOTAL) BY MOUTH AT BEDTIME.  30 tablet  3  . Multiple Vitamin (MULTIVITAMIN) tablet Take 1 tablet by mouth daily.      Marland Kitchen penciclovir (DENAVIR) 1 % cream Apply topically every 2 (two) hours.  1.5 g  1  . valACYclovir (VALTREX) 1000 MG tablet TAKE 1 TABLET BY MOUTH TWICE A DAY  60 tablet  1   No current facility-administered medications on file prior to visit.    Allergies  Allergen Reactions  . Actifed [Triprolidine-Pse] Hives    Review of Systems  Review of Systems  Constitutional: Negative for fever and malaise/fatigue.  HENT: Negative for congestion.        Infrequent HA and c/w history  Eyes: Negative for discharge.  Respiratory: Negative for shortness of breath.   Cardiovascular: Negative for chest pain, palpitations and leg swelling.  Gastrointestinal: Negative for nausea, abdominal pain and diarrhea.  Genitourinary: Negative for dysuria.  Musculoskeletal: Negative for falls.  Skin: Negative for rash.  Neurological: Positive for headaches. Negative for loss of consciousness.  Endo/Heme/Allergies: Negative for polydipsia.  Psychiatric/Behavioral: Negative for depression and suicidal ideas. The patient is not nervous/anxious and does not have insomnia.     Objective  BP 128/82  Pulse 84  Temp(Src) 98.7 F (37.1 C) (Oral)  Ht 5\' 5"  (1.651 m)  Wt 197 lb 8 oz (89.585 kg)  BMI 32.87 kg/m2  SpO2 100%  LMP 02/13/2013  Physical Exam  Physical Exam  Constitutional: She is oriented to person, place, and time and well-developed, well-nourished, and in no distress. No distress.  HENT:  Head: Normocephalic and atraumatic.  Eyes: Conjunctivae are normal.  Neck:  Neck supple. No thyromegaly present.  Cardiovascular: Normal rate, regular rhythm and normal heart sounds.   No murmur heard. Pulmonary/Chest: Effort normal and breath sounds normal. She has no wheezes.  Abdominal: She exhibits no distension and no mass.  Musculoskeletal: She exhibits no edema.  Lymphadenopathy:    She has no cervical adenopathy.  Neurological: She is alert and oriented to person, place, and time.  Skin: Skin is warm and dry. No rash noted. She is not diaphoretic.  Psychiatric: Memory, affect and judgment normal.    Lab Results  Component Value Date   TSH 1.04 04/26/2012   Lab Results  Component Value Date  WBC 5.5 04/26/2012   HGB 13.3 04/26/2012   HCT 39.6 04/26/2012   MCV 92.4 04/26/2012   PLT 251.0 04/26/2012   Lab Results  Component Value Date   CREATININE 0.8 04/26/2012   BUN 8 04/26/2012   NA 138 04/26/2012   K 4.3 04/26/2012   CL 102 04/26/2012   CO2 30 04/26/2012   Lab Results  Component Value Date   ALT 15 04/26/2012   AST 16 04/26/2012   ALKPHOS 56 04/26/2012   BILITOT 0.5 04/26/2012   Lab Results  Component Value Date   CHOL 190 04/26/2012   Lab Results  Component Value Date   HDL 43.40 04/26/2012   Lab Results  Component Value Date   LDLCALC 120* 04/26/2012   Lab Results  Component Value Date   TRIG 131.0 04/26/2012   Lab Results  Component Value Date   CHOLHDL 4 04/26/2012     Assessment & Plan  ADD (attention deficit disorder) Has had good response to Adderall XR at 30 mg most days, has only needed pm dose of short acting adderall. No concerning side effects identified

## 2013-04-05 NOTE — Patient Instructions (Signed)
Attention Deficit Hyperactivity Disorder Attention deficit hyperactivity disorder (ADHD) is a problem with behavior issues based on the way the brain functions (neurobehavioral disorder). It is a common reason for behavior and academic problems in school. CAUSES  The cause of ADHD is unknown in most cases. It may run in families. It sometimes can be associated with learning disabilities and other behavioral problems. SYMPTOMS  There are 3 types of ADHD. The 3 types and some of the symptoms include:  Inattentive  Gets bored or distracted easily.  Loses or forgets things. Forgets to hand in homework.  Has trouble organizing or completing tasks.  Difficulty staying on task.  An inability to organize daily tasks and school work.  Leaving projects, chores, or homework unfinished.  Trouble paying attention or responding to details. Careless mistakes.  Difficulty following directions. Often seems like is not listening.  Dislikes activities that require sustained attention (like chores or homework).  Hyperactive-impulsive  Feels like it is impossible to sit still or stay in a seat. Fidgeting with hands and feet.  Trouble waiting turn.  Talking too much or out of turn. Interruptive.  Speaks or acts impulsively.  Aggressive, disruptive behavior.  Constantly busy or on the go, noisy.  Combined  Has symptoms of both of the above. Often children with ADHD feel discouraged about themselves and with school. They often perform well below their abilities in school. These symptoms can cause problems in home, school, and in relationships with peers. As children get older, the excess motor activities can calm down, but the problems with paying attention and staying organized persist. Most children do not outgrow ADHD but with good treatment can learn to cope with the symptoms. DIAGNOSIS  When ADHD is suspected, the diagnosis should be made by professionals trained in ADHD.  Diagnosis will  include:  Ruling out other reasons for the child's behavior.  The caregivers will check with the child's school and check their medical records.  They will talk to teachers and parents.  Behavior rating scales for the child will be filled out by those dealing with the child on a daily basis. A diagnosis is made only after all information has been considered. TREATMENT  Treatment usually includes behavioral treatment often along with medicines. It may include stimulant medicines. The stimulant medicines decrease impulsivity and hyperactivity and increase attention. Other medicines used include antidepressants and certain blood pressure medicines. Most experts agree that treatment for ADHD should address all aspects of the child's functioning. Treatment should not be limited to the use of medicines alone. Treatment should include structured classroom management. The parents must receive education to address rewarding good behavior, discipline, and limit-setting. Tutoring or behavioral therapy or both should be available for the child. If untreated, the disorder can have long-term serious effects into adolescence and adulthood. HOME CARE INSTRUCTIONS   Often with ADHD there is a lot of frustration among the family in dealing with the illness. There is often blame and anger that is not warranted. This is a life long illness. There is no way to prevent ADHD. In many cases, because the problem affects the family as a whole, the entire family may need help. A therapist can help the family find better ways to handle the disruptive behaviors and promote change. If the child is young, most of the therapist's work is with the parents. Parents will learn techniques for coping with and improving their child's behavior. Sometimes only the child with the ADHD needs counseling. Your caregivers can help   you make these decisions.  Children with ADHD may need help in organizing. Some helpful tips include:  Keep  routines the same every day from wake-up time to bedtime. Schedule everything. This includes homework and playtime. This should include outdoor and indoor recreation. Keep the schedule on the refrigerator or a bulletin board where it is frequently seen. Mark schedule changes as far in advance as possible.  Have a place for everything and keep everything in its place. This includes clothing, backpacks, and school supplies.  Encourage writing down assignments and bringing home needed books.  Offer your child a well-balanced diet. Breakfast is especially important for school performance. Children should avoid drinks with caffeine including:  Soft drinks.  Coffee.  Tea.  However, some older children (adolescents) may find these drinks helpful in improving their attention.  Children with ADHD need consistent rules that they can understand and follow. If rules are followed, give small rewards. Children with ADHD often receive, and expect, criticism. Look for good behavior and praise it. Set realistic goals. Give clear instructions. Look for activities that can foster success and self-esteem. Make time for pleasant activities with your child. Give lots of affection.  Parents are their children's greatest advocates. Learn as much as possible about ADHD. This helps you become a stronger and better advocate for your child. It also helps you educate your child's teachers and instructors if they feel inadequate in these areas. Parent support groups are often helpful. A national group with local chapters is called CHADD (Children and Adults with Attention Deficit Hyperactivity Disorder). PROGNOSIS  There is no cure for ADHD. Children with the disorder seldom outgrow it. Many find adaptive ways to accommodate the ADHD as they mature. SEEK MEDICAL CARE IF:  Your child has repeated muscle twitches, cough or speech outbursts.  Your child has sleep problems.  Your child has a marked loss of  appetite.  Your child develops depression.  Your child has new or worsening behavioral problems.  Your child develops dizziness.  Your child has a racing heart.  Your child has stomach pains.  Your child develops headaches. Document Released: 05/27/2002 Document Revised: 08/29/2011 Document Reviewed: 01/07/2008 [employer] Patient Information 2014 [employer].  

## 2013-05-14 ENCOUNTER — Encounter: Payer: Self-pay | Admitting: Family Medicine

## 2013-05-14 ENCOUNTER — Ambulatory Visit (INDEPENDENT_AMBULATORY_CARE_PROVIDER_SITE_OTHER): Payer: Managed Care, Other (non HMO) | Admitting: Family Medicine

## 2013-05-14 VITALS — BP 102/68 | HR 73 | Temp 98.2°F | Ht 65.0 in | Wt 199.1 lb

## 2013-05-14 DIAGNOSIS — Z0000 Encounter for general adult medical examination without abnormal findings: Secondary | ICD-10-CM

## 2013-05-14 DIAGNOSIS — B958 Unspecified staphylococcus as the cause of diseases classified elsewhere: Secondary | ICD-10-CM

## 2013-05-14 DIAGNOSIS — IMO0001 Reserved for inherently not codable concepts without codable children: Secondary | ICD-10-CM

## 2013-05-14 DIAGNOSIS — E785 Hyperlipidemia, unspecified: Secondary | ICD-10-CM

## 2013-05-14 DIAGNOSIS — F988 Other specified behavioral and emotional disorders with onset usually occurring in childhood and adolescence: Secondary | ICD-10-CM

## 2013-05-14 DIAGNOSIS — K219 Gastro-esophageal reflux disease without esophagitis: Secondary | ICD-10-CM

## 2013-05-14 LAB — LIPID PANEL
Cholesterol: 205 mg/dL — ABNORMAL HIGH (ref 0–200)
HDL: 52 mg/dL (ref >39)
Total CHOL/HDL Ratio: 3.9 Ratio

## 2013-05-14 LAB — CBC
MCH: 31 pg (ref 26.0–34.0)
MCHC: 34.9 g/dL (ref 30.0–36.0)
Platelets: 295 10*3/uL (ref 150–400)
RBC: 4.62 MIL/uL (ref 3.87–5.11)

## 2013-05-14 LAB — RENAL FUNCTION PANEL
Albumin: 4.6 g/dL (ref 3.5–5.2)
Creat: 0.79 mg/dL (ref 0.50–1.10)
Phosphorus: 3.5 mg/dL (ref 2.3–4.6)

## 2013-05-14 LAB — HEPATIC FUNCTION PANEL
Albumin: 4.6 g/dL (ref 3.5–5.2)
Total Bilirubin: 0.6 mg/dL (ref 0.3–1.2)

## 2013-05-14 MED ORDER — AMPHETAMINE-DEXTROAMPHETAMINE 10 MG PO TABS: 10.0000 mg | ORAL_TABLET | Freq: Every day | ORAL | Status: DC | PRN

## 2013-05-14 MED ORDER — AMPHETAMINE-DEXTROAMPHET ER 30 MG PO CP24: 30.0000 mg | ORAL_CAPSULE | ORAL | Status: DC

## 2013-05-14 MED ORDER — MUPIROCIN 2 % EX OINT: 1.0000 "application " | TOPICAL_OINTMENT | Freq: Two times a day (BID) | CUTANEOUS | Status: DC

## 2013-05-14 NOTE — Progress Notes (Signed)
Patient ID: Kristina Watts, female   DOB: 02/06/1971, 42 y.o.   MRN: [id number] Kristina Watts [id number] 03/14/1971 05/14/2013      Progress Note-Follow Up  Subjective  Chief Complaint  Chief Complaint  Patient presents with  . Annual Exam    physical    HPI  Patient is a 42 year old Caucasian female who is in today for annual exam. Started just finished her college courses and is still working full-time. Generally doing well but is getting minimal sleep. Adderall has been helpful to get her through her classes. No recent illness. No fevers, headaches, insomnia, chest pain, anxiety, palpitations, shortness of breath, GI or GU concerns.  Past Medical History  Diagnosis Date  . Chicken pox as a child  . Pneumonia 1998  . Allergy     seasonal  . Asthma   . Depression   . Recurrent cold sores   . Depression with anxiety 04/18/2011  . 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 Overweight(278.02) 04/19/2011  . GDM (gestational diabetes mellitus) 04/19/2011  . History of pancreatitis 04/19/2011  . Allergic state 04/19/2011  . Preventative health care 04/19/2011  . Asthma 04/19/2011  . Tinea corporis 05/18/2011  . Fatigue 05/30/2011  . Otitis media of right ear 05/30/2011  . Dermatitis 05/18/2011  . Pharyngitis 08/23/2011  . Migraine 08/23/2011  . Pancreatitis, recurrent 12/12/2011  . Reflux 12/12/2011  . LUQ pain 12/12/2011  . Hyperlipidemia 04/27/2012  . ADD (attention deficit disorder) 04/27/2012  . Acute bronchitis 11/19/2012    Past Surgical History  Procedure Laterality Date  . Appendectomy  2007  . Cholecystectomy  2000  . Cesarean section  1-02 and 4-04    Family History  Problem Relation Age of Onset  . Thyroid disease Mother   . Hypertension Father   . Hyperlipidemia Father   . Heart disease Father   . Diabetes Father   . COPD Father     previous smoker  . Stroke Father   . Depression Father   . Restless legs syndrome Father   . Diabetes Brother    . Cancer Maternal Grandmother     Head and Neck  . Heart disease Paternal Grandmother   . Hyperlipidemia Paternal Grandmother   . Hypertension Paternal Grandmother   . Diabetes Paternal Grandmother   . Depression Paternal Grandmother   . Stroke Paternal Grandmother   . Restless legs syndrome Paternal Grandmother   . Heart disease Paternal Grandfather   . Hyperlipidemia Paternal Grandfather   . Hypertension Paternal Grandfather   . Stroke Paternal Grandfather   . Diabetes Paternal Grandfather     History   Social History  . Marital Status: Married    Spouse Name: N/A    Number of Children: N/A  . Years of Education: N/A   Occupational History  . Not on file.   Social History Main Topics  . Smoking status: Never Smoker   . Smokeless tobacco: Never Used  . Alcohol Use: Yes     Comment: rarely  . Drug Use: No  . Sexual Activity: Yes    Partners: Male   Other Topics Concern  . Not on file   Social History Narrative  . No narrative on file    Current Outpatient Prescriptions on File Prior to Visit  Medication Sig Dispense Refill  . Acetaminophen (TYLENOL EXTRA STRENGTH PO) Take 2 tablets by mouth as needed.      Marland Kitchen albuterol (PROVENTIL HFA;VENTOLIN HFA) 108 (90 BASE) MCG/ACT inhaler Inhale  2 puffs into the lungs as needed.        . budesonide-formoterol (SYMBICORT) 160-4.5 MCG/ACT inhaler Inhale 2 puffs into the lungs 2 (two) times daily as needed.  1 Inhaler  4  . Butalbital-APAP-Caffeine 50-300-40 MG CAPS Take 1 capsule by mouth 3 (three) times daily as needed. HA  84 capsule  1  . citalopram (CELEXA) 10 MG tablet Take 1 tablet (10 mg total) by mouth 2 (two) times daily.  180 tablet  1  . ibuprofen (ADVIL,MOTRIN) 200 MG tablet Take 200 mg by mouth every 6 (six) hours as needed.      . montelukast (SINGULAIR) 10 MG tablet TAKE 1 TABLET (10 MG TOTAL) BY MOUTH AT BEDTIME.  30 tablet  3  . Multiple Vitamin (MULTIVITAMIN) tablet Take 1 tablet by mouth daily.      Marland Kitchen  penciclovir (DENAVIR) 1 % cream Apply topically every 2 (two) hours.  1.5 g  1  . valACYclovir (VALTREX) 1000 MG tablet TAKE 1 TABLET BY MOUTH TWICE A DAY  60 tablet  1   No current facility-administered medications on file prior to visit.    Allergies  Allergen Reactions  . Actifed [Triprolidine-Pse] Hives    Review of Systems  Review of Systems  Constitutional: Positive for malaise/fatigue. Negative for fever and chills.  HENT: Negative for congestion, hearing loss and nosebleeds.   Eyes: Negative for discharge.  Respiratory: Negative for cough, sputum production, shortness of breath and wheezing.   Cardiovascular: Negative for chest pain, palpitations and leg swelling.  Gastrointestinal: Negative for heartburn, nausea, vomiting, abdominal pain, diarrhea, constipation and blood in stool.  Genitourinary: Negative for dysuria, urgency, frequency and hematuria.  Musculoskeletal: Negative for back pain, falls and myalgias.  Skin: Negative for rash.  Neurological: Negative for dizziness, tremors, sensory change, focal weakness, loss of consciousness, weakness and headaches.  Endo/Heme/Allergies: Negative for polydipsia. Does not bruise/bleed easily.  Psychiatric/Behavioral: Negative for depression and suicidal ideas. The patient is not nervous/anxious and does not have insomnia.     Objective  BP 102/68  Pulse 73  Temp(Src) 98.2 F (36.8 C) (Oral)  Ht 5\' 5"  (1.651 m)  Wt 199 lb 1.3 oz (90.302 kg)  BMI 33.13 kg/m2  SpO2 98%  LMP 05/10/2013  Physical Exam  Physical Exam  Constitutional: She is oriented to person, place, and time and well-developed, well-nourished, and in no distress. No distress.  HENT:  Head: Normocephalic and atraumatic.  Eyes: Conjunctivae are normal.  Neck: Neck supple. No thyromegaly present.  Cardiovascular: Normal rate, regular rhythm and normal heart sounds.   No murmur heard. Pulmonary/Chest: Effort normal and breath sounds normal. She has no  wheezes.  Abdominal: She exhibits no distension and no mass.  Musculoskeletal: She exhibits no edema.  Lymphadenopathy:    She has no cervical adenopathy.  Neurological: She is alert and oriented to person, place, and time.  Skin: Skin is warm and dry. No rash noted. She is not diaphoretic.  Psychiatric: Memory, affect and judgment normal.    Lab Results  Component Value Date   TSH 1.04 04/26/2012   Lab Results  Component Value Date   WBC 5.5 04/26/2012   HGB 13.3 04/26/2012   HCT 39.6 04/26/2012   MCV 92.4 04/26/2012   PLT 251.0 04/26/2012   Lab Results  Component Value Date   CREATININE 0.8 04/26/2012   BUN 8 04/26/2012   NA 138 04/26/2012   K 4.3 04/26/2012   CL 102 04/26/2012   CO2 30  04/26/2012   Lab Results  Component Value Date   ALT 15 04/26/2012   AST 16 04/26/2012   ALKPHOS 56 04/26/2012   BILITOT 0.5 04/26/2012   Lab Results  Component Value Date   CHOL 190 04/26/2012   Lab Results  Component Value Date   HDL 43.40 04/26/2012   Lab Results  Component Value Date   LDLCALC 120* 04/26/2012   Lab Results  Component Value Date   TRIG 131.0 04/26/2012   Lab Results  Component Value Date   CHOLHDL 4 04/26/2012     Assessment & Plan  ADD (attention deficit disorder) Doing well on current dose of Adderall, given refills  Reflux Avoid offending foods, continue probiotics  Hyperlipidemia Mild, avoid trans fats consider krill oil caps   Staph infection In nares given mupirocin to use prn

## 2013-05-14 NOTE — Patient Instructions (Signed)
mucinex twice a day Zinc such as coldeeze and symbicort twice a day   Asthma, Adult Asthma is a recurring condition in which the airways tighten and narrow. Asthma can make it difficult to breathe. It can cause coughing, wheezing, and shortness of breath. Asthma episodes (also called asthma attacks) range from minor to life-threatening. Asthma cannot be cured, but medicines and lifestyle changes can help control it. CAUSES Asthma is believed to be caused by inherited (genetic) and environmental factors, but its exact cause is unknown. Asthma may be triggered by allergens, lung infections, or irritants in the air. Asthma triggers are different for each person. Common triggers include:   Animal dander.  Dust mites.  Cockroaches.  Pollen from trees or grass.  Mold.  Smoke.  Air pollutants such as dust, household cleaners, hair sprays, aerosol sprays, paint fumes, strong chemicals, or strong odors.  Cold air, weather changes, and winds (which increase molds and pollens in the air).  Strong emotional expressions such as crying or laughing hard.  Stress.  Certain medicines (such as aspirin) or types of drugs (such as beta-blockers).  Sulfites in foods and drinks. Foods and drinks that may contain sulfites include dried fruit, potato chips, and sparkling grape juice.  Infections or inflammatory conditions such as the flu, a cold, or an inflammation of the nasal membranes (rhinitis).  Gastroesophageal reflux disease (GERD).  Exercise or strenuous activity. SYMPTOMS Symptoms may occur immediately after asthma is triggered or many hours later. Symptoms include:  Wheezing.  Excessive nighttime or early morning coughing.  Frequent or severe coughing with a common cold.  Chest tightness.  Shortness of breath. DIAGNOSIS  The diagnosis of asthma is made by a review of your medical history and a physical exam. Tests may also be performed. These may include:  Lung function  studies. These tests show how much air you breath in and out.  Allergy tests.  Imaging tests such as X-rays. TREATMENT  Asthma cannot be cured, but it can usually be controlled. Treatment involves identifying and avoiding your asthma triggers. It also involves medicines. There are 2 classes of medicine used for asthma treatment:   Controller medicines. These prevent asthma symptoms from occurring. They are usually taken every day.  Reliever or rescue medicines. These quickly relieve asthma symptoms. They are used as needed and provide short-term relief. Your health care provider will help you create an asthma action plan. An asthma action plan is a written plan for managing and treating your asthma attacks. It includes a list of your asthma triggers and how they may be avoided. It also includes information on when medicines should be taken and when their dosage should be changed. An action plan may also involve the use of a device called a peak flow meter. A peak flow meter measures how well the lungs are working. It helps you monitor your condition. HOME CARE INSTRUCTIONS   Take medicine as directed by your health care provider. Speak with your health care provider if you have questions about how or when to take the medicines.  Use a peak flow meter as directed by your health care provider. Record and keep track of readings.  Understand and use the action plan to help minimize or stop an asthma attack without needing to seek medical care.  Control your home environment in the following ways to help prevent asthma attacks:  Do not smoke. Avoid being exposed to secondhand smoke.  Change your heating and air conditioning filter regularly.  Limit your  use of fireplaces and wood stoves.  Get rid of pests (such as roaches and mice) and their droppings.  Throw away plants if you see mold on them.  Clean your floors and dust regularly. Use unscented cleaning products.  Try to have someone  else vacuum for you regularly. Stay out of rooms while they are being vacuumed and for a short while afterward. If you vacuum, use a dust mask from a hardware store, a double-layered or microfilter vacuum cleaner bag, or a vacuum cleaner with a HEPA filter.  Replace carpet with wood, tile, or vinyl flooring. Carpet can trap dander and dust.  Use allergy-proof pillows, mattress covers, and box spring covers.  Wash bed sheets and blankets every week in hot water and dry them in a dryer.  Use blankets that are made of polyester or cotton.  Clean bathrooms and kitchens with bleach. If possible, have someone repaint the walls in these rooms with mold-resistant paint. Keep out of the rooms that are being cleaned and painted.  Wash hands frequently. SEEK MEDICAL CARE IF:   You have wheezing, shortness of breath, or a cough even if taking medicine to prevent attacks.  The colored mucus you cough up (sputum) is thicker than usual.  Your sputum changes from clear or white to yellow, green, gray, or bloody.  You have any problems that may be related to the medicines you are taking (such as a rash, itching, swelling, or trouble breathing).  You are using a reliever medicine more than 2 3 times per week.  Your peak flow is still at 50 79% of you personal best after following your action plan for 1 hour. SEEK IMMEDIATE MEDICAL CARE IF:   You seem to be getting worse and are unresponsive to treatment during an asthma attack.  You are short of breath even at rest.  You get short of breath when doing very little physical activity.  You have difficulty eating, drinking, or talking due to asthma symptoms.  You develop chest pain.  You develop a fast heartbeat.  You have a bluish color to your lips or fingernails.  You are lightheaded, dizzy, or faint.  Your peak flow is less than 50% of your personal best.  You have a fever or persistent symptoms for more than 2 3 days.  You have a fever  and symptoms suddenly get worse. MAKE SURE YOU:   Understand these instructions.  Will watch your condition.  Will get help right away if you are not doing well or get worse. Document Released: 06/06/2005 Document Revised: 02/06/2013 Document Reviewed: 01/03/2013 [facility] Patient Information 2014 [place], [place].

## 2013-05-14 NOTE — Progress Notes (Signed)
Pre visit review using our clinic review tool, if applicable. No additional management support is needed unless otherwise documented below in the visit note. 

## 2013-05-15 LAB — TSH: TSH: 1.41 u[IU]/mL (ref 0.350–4.500)

## 2013-05-19 DIAGNOSIS — B958 Unspecified staphylococcus as the cause of diseases classified elsewhere: Secondary | ICD-10-CM | POA: Insufficient documentation

## 2013-05-19 NOTE — Assessment & Plan Note (Signed)
Doing well on current dose of Adderall, given refills

## 2013-05-19 NOTE — Assessment & Plan Note (Signed)
Avoid offending foods, continue probiotics. 

## 2013-05-19 NOTE — Assessment & Plan Note (Signed)
Mild, avoid trans fats consider krill oil caps

## 2013-05-19 NOTE — Assessment & Plan Note (Signed)
In nares given mupirocin to use prn

## 2013-05-29 ENCOUNTER — Other Ambulatory Visit: Payer: Self-pay | Admitting: Family Medicine

## 2013-06-19 ENCOUNTER — Telehealth: Payer: Self-pay | Admitting: Family Medicine

## 2013-06-19 DIAGNOSIS — F418 Other specified anxiety disorders: Secondary | ICD-10-CM

## 2013-06-19 NOTE — Telephone Encounter (Signed)
refill-citalopram hbr 10mg  tablet. Take one tablet by mouth twice a day *insurance only covers 30 days* qty 180 last fill 11.29.14

## 2013-06-24 NOTE — Telephone Encounter (Signed)
Please advise 

## 2013-06-24 NOTE — Telephone Encounter (Signed)
Citalopram 10 mg po bid Disp #60 with 3 rf

## 2013-06-25 MED ORDER — CITALOPRAM HYDROBROMIDE 10 MG PO TABS: 10.0000 mg | ORAL_TABLET | Freq: Two times a day (BID) | ORAL | Status: DC

## 2013-08-13 ENCOUNTER — Ambulatory Visit: Payer: Managed Care, Other (non HMO) | Admitting: Family Medicine

## 2013-08-14 ENCOUNTER — Ambulatory Visit (INDEPENDENT_AMBULATORY_CARE_PROVIDER_SITE_OTHER): Payer: Managed Care, Other (non HMO) | Admitting: Family Medicine

## 2013-08-14 ENCOUNTER — Encounter: Payer: Self-pay | Admitting: Family Medicine

## 2013-08-14 VITALS — BP 102/64 | HR 69 | Temp 97.6°F | Ht 65.0 in | Wt 201.1 lb

## 2013-08-14 DIAGNOSIS — F988 Other specified behavioral and emotional disorders with onset usually occurring in childhood and adolescence: Secondary | ICD-10-CM

## 2013-08-14 DIAGNOSIS — J45909 Unspecified asthma, uncomplicated: Secondary | ICD-10-CM

## 2013-08-14 MED ORDER — LISDEXAMFETAMINE DIMESYLATE 40 MG PO CAPS: 40.0000 mg | ORAL_CAPSULE | ORAL | Status: DC

## 2013-08-14 MED ORDER — AMPHETAMINE-DEXTROAMPHETAMINE 10 MG PO TABS: 10.0000 mg | ORAL_TABLET | Freq: Two times a day (BID) | ORAL | Status: DC | PRN

## 2013-08-14 NOTE — Progress Notes (Signed)
Pre visit review using our clinic review tool, if applicable. No additional management support is needed unless otherwise documented below in the visit note. 

## 2013-08-14 NOTE — Patient Instructions (Signed)
Attention Deficit Hyperactivity Disorder Attention deficit hyperactivity disorder (ADHD) is a problem with behavior issues based on the way the brain functions (neurobehavioral disorder). It is a common reason for behavior and academic problems in school. SYMPTOMS  There are 3 types of ADHD. The 3 types and some of the symptoms include:  Inattentive  Gets bored or distracted easily.  Loses or forgets things. Forgets to hand in homework.  Has trouble organizing or completing tasks.  Difficulty staying on task.  An inability to organize daily tasks and school work.  Leaving projects, chores, or homework unfinished.  Trouble paying attention or responding to details. Careless mistakes.  Difficulty following directions. Often seems like is not listening.  Dislikes activities that require sustained attention (like chores or homework).  Hyperactive-impulsive  Feels like it is impossible to sit still or stay in a seat. Fidgeting with hands and feet.  Trouble waiting turn.  Talking too much or out of turn. Interruptive.  Speaks or acts impulsively.  Aggressive, disruptive behavior.  Constantly busy or on the go, noisy.  Often leaves seat when they are expected to remain seated.  Often runs or climbs where it is not appropriate, or feels very restless.  Combined  Has symptoms of both of the above. Often children with ADHD feel discouraged about themselves and with school. They often perform well below their abilities in school. As children get older, the excess motor activities can calm down, but the problems with paying attention and staying organized persist. Most children do not outgrow ADHD but with good treatment can learn to cope with the symptoms. DIAGNOSIS  When ADHD is suspected, the diagnosis should be made by professionals trained in ADHD. This professional will collect information about the individual suspected of having ADHD. Information must be collected from  various settings where the person lives, works, or attends school.  Diagnosis will include:  Confirming symptoms began in childhood.  Ruling out other reasons for the child's behavior.  The health care providers will check with the child's school and check their medical records.  They will talk to teachers and parents.  Behavior rating scales for the child will be filled out by those dealing with the child on a daily basis. A diagnosis is made only after all information has been considered. TREATMENT  Treatment usually includes behavioral treatment, tutoring or extra support in school, and stimulant medicines. Because of the way a person's brain works with ADHD, these medicines decrease impulsivity and hyperactivity and increase attention. This is different than how they would work in a person who does not have ADHD. Other medicines used include antidepressants and certain blood pressure medicines. Most experts agree that treatment for ADHD should address all aspects of the person's functioning. Along with medicines, treatment should include structured classroom management at school. Parents should reward good behavior, provide constant discipline, and limit-setting. Tutoring should be available for the child as needed. ADHD is a life-long condition. If untreated, the disorder can have long-term serious effects into adolescence and adulthood. HOME CARE INSTRUCTIONS   Often with ADHD there is a lot of frustration among family members dealing with the condition. Blame and anger are also feelings that are common. In many cases, because the problem affects the family as a whole, the entire family may need help. A therapist can help the family find better ways to handle the disruptive behaviors of the person with ADHD and promote change. If the person with ADHD is young, most of the therapist's work   is with the parents. Parents will learn techniques for coping with and improving their child's  behavior. Sometimes only the child with the ADHD needs counseling. Your health care providers can help you make these decisions.  Children with ADHD may need help learning how to organize. Some helpful tips include:  Keep routines the same every day from wake-up time to bedtime. Schedule all activities, including homework and playtime. Keep the schedule in a place where the person with ADHD will often see it. Mark schedule changes as far in advance as possible.  Schedule outdoor and indoor recreation.  Have a place for everything and keep everything in its place. This includes clothing, backpacks, and school supplies.  Encourage writing down assignments and bringing home needed books. Work with your child's teachers for assistance in organizing school work.  Offer your child a well-balanced diet. Breakfast that includes a balance of whole grains, protein and, fruits or vegetables is especially important for school performance. Children should avoid drinks with caffeine including:  Soft drinks.  Coffee.  Tea.  However, some older children (adolescents) may find these drinks helpful in improving their attention. Because it can also be common for adolescents with ADHD to become addicted to caffeine, talk with your health care provider about what is a safe amount of caffeine intake for your child.  Children with ADHD need consistent rules that they can understand and follow. If rules are followed, give small rewards. Children with ADHD often receive, and expect, criticism. Look for good behavior and praise it. Set realistic goals. Give clear instructions. Look for activities that can foster success and self-esteem. Make time for pleasant activities with your child. Give lots of affection.  Parents are their children's greatest advocates. Learn as much as possible about ADHD. This helps you become a stronger and better advocate for your child. It also helps you educate your child's teachers and  instructors if they feel inadequate in these areas. Parent support groups are often helpful. A national group with local chapters is called Children and Adults with Attention Deficit Hyperactivity Disorder (CHADD). SEEK MEDICAL CARE IF:  Your child has repeated muscle twitches, cough or speech outbursts.  Your child has sleep problems.  Your child has a marked loss of appetite.  Your child develops depression.  Your child has new or worsening behavioral problems.  Your child develops dizziness.  Your child has a racing heart.  Your child has stomach pains.  Your child develops headaches. SEEK IMMEDIATE MEDICAL CARE IF:  Your child has been diagnosed with depression or anxiety and the symptoms seem to be getting worse.  Your child has been depressed and suddenly appears to have increased energy or motivation.  You are worried that your child is having a bad reaction to a medication he or she is taking for ADHD. Document Released: 05/27/2002 Document Revised: 03/27/2013 Document Reviewed: 02/11/2013 [employer] Patient Information 2014 [employer].  

## 2013-08-18 ENCOUNTER — Encounter: Payer: Self-pay | Admitting: Family Medicine

## 2013-08-18 NOTE — Progress Notes (Signed)
Patient ID: Kristina Watts, female   DOB: 10-15-1970, 43 y.o.   MRN: [id number] Kristina Watts [id number] 17-Mar-1971 08/18/2013      Progress Note-Follow Up  Subjective  Chief Complaint  Chief Complaint  Patient presents with  . Follow-up    3 month    HPI  Patient is a 44 Caucasian female who is in today for followup on ADD. Adderall XR is not lasting her throughout the day while she is working in school. She's not had any trouble to medication. Denies headaches, chest pain, palpitations, shortness of breath, GI or GU complaints. Does struggle with chronic fatigue  Past Medical History  Diagnosis Date  . Chicken pox as a child  . Pneumonia 1998  . Allergy     seasonal  . Asthma   . Depression   . Recurrent cold sores   . Depression with anxiety 04/18/2011  . 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 Overweight 04/19/2011  . GDM (gestational diabetes mellitus) 04/19/2011  . History of pancreatitis 04/19/2011  . Allergic state 04/19/2011  . Preventative health care 04/19/2011  . Asthma 04/19/2011  . Tinea corporis 05/18/2011  . Fatigue 05/30/2011  . Otitis media of right ear 05/30/2011  . Dermatitis 05/18/2011  . Pharyngitis 08/23/2011  . Migraine 08/23/2011  . Pancreatitis, recurrent 12/12/2011  . Reflux 12/12/2011  . LUQ pain 12/12/2011  . Hyperlipidemia 04/27/2012  . ADD (attention deficit disorder) 04/27/2012  . Acute bronchitis 11/19/2012    Past Surgical History  Procedure Laterality Date  . Appendectomy  2007  . Cholecystectomy  2000  . Cesarean section  1-02 and 4-04    Family History  Problem Relation Age of Onset  . Thyroid disease Mother   . Hypertension Father   . Hyperlipidemia Father   . Heart disease Father   . Diabetes Father   . COPD Father     previous smoker  . Stroke Father   . Depression Father   . Restless legs syndrome Father   . Diabetes Brother   . Cancer Maternal Grandmother     Head and Neck  . Heart disease Paternal  Grandmother   . Hyperlipidemia Paternal Grandmother   . Hypertension Paternal Grandmother   . Diabetes Paternal Grandmother   . Depression Paternal Grandmother   . Stroke Paternal Grandmother   . Restless legs syndrome Paternal Grandmother   . Heart disease Paternal Grandfather   . Hyperlipidemia Paternal Grandfather   . Hypertension Paternal Grandfather   . Stroke Paternal Grandfather   . Diabetes Paternal Grandfather     History   Social History  . Marital Status: Married    Spouse Name: N/A    Number of Children: N/A  . Years of Education: N/A   Occupational History  . Not on file.   Social History Main Topics  . Smoking status: Never Smoker   . Smokeless tobacco: Never Used  . Alcohol Use: Yes     Comment: rarely  . Drug Use: No  . Sexual Activity: Yes    Partners: Male   Other Topics Concern  . Not on file   Social History Narrative  . No narrative on file    Current Outpatient Prescriptions on File Prior to Visit  Medication Sig Dispense Refill  . Acetaminophen (TYLENOL EXTRA STRENGTH PO) Take 2 tablets by mouth as needed.      Marland Kitchen albuterol (PROVENTIL HFA;VENTOLIN HFA) 108 (90 BASE) MCG/ACT inhaler Inhale 2 puffs into the lungs as needed.        Marland Kitchen  Butalbital-APAP-Caffeine 50-300-40 MG CAPS Take 1 capsule by mouth 3 (three) times daily as needed. HA  84 capsule  1  . citalopram (CELEXA) 10 MG tablet Take 1 tablet (10 mg total) by mouth 2 (two) times daily.  60 tablet  3  . ibuprofen (ADVIL,MOTRIN) 200 MG tablet Take 200 mg by mouth every 6 (six) hours as needed.      . montelukast (SINGULAIR) 10 MG tablet TAKE 1 TABLET (10 MG TOTAL) BY MOUTH AT BEDTIME.  30 tablet  3  . Multiple Vitamin (MULTIVITAMIN) tablet Take 1 tablet by mouth daily.      . mupirocin ointment (BACTROBAN) 2 % Place 1 application into the nose 2 (two) times daily. Via clean qtip x 7 days  22 g  1  . penciclovir (DENAVIR) 1 % cream Apply topically every 2 (two) hours.  1.5 g  1  . SYMBICORT  160-4.5 MCG/ACT inhaler INHALE 2 PUFFS INTO THE LUNGS 2 (TWO) TIMES DAILY AS NEEDED.  10.2 g  4  . valACYclovir (VALTREX) 1000 MG tablet TAKE 1 TABLET BY MOUTH TWICE A DAY  60 tablet  1   No current facility-administered medications on file prior to visit.    Allergies  Allergen Reactions  . Actifed [Triprolidine-Pse] Hives    Review of Systems  Review of Systems  Constitutional: Negative for fever and malaise/fatigue.  HENT: Negative for congestion.   Eyes: Negative for discharge.  Respiratory: Negative for shortness of breath.   Cardiovascular: Negative for chest pain, palpitations and leg swelling.  Gastrointestinal: Negative for nausea, abdominal pain and diarrhea.  Genitourinary: Negative for dysuria.  Musculoskeletal: Negative for falls.  Skin: Negative for rash.  Neurological: Negative for loss of consciousness and headaches.  Endo/Heme/Allergies: Negative for polydipsia.  Psychiatric/Behavioral: Negative for depression and suicidal ideas. The patient is not nervous/anxious and does not have insomnia.     Objective  BP 102/64  Pulse 69  Temp(Src) 97.6 F (36.4 C) (Oral)  Ht 5\' 5"  (1.651 m)  Wt 201 lb 1.9 oz (91.227 kg)  BMI 33.47 kg/m2  SpO2 98%  LMP 07/31/2013  Physical Exam  Physical Exam  Constitutional: She is oriented to person, place, and time and well-developed, well-nourished, and in no distress. No distress.  HENT:  Head: Normocephalic and atraumatic.  Eyes: Conjunctivae are normal.  Neck: Neck supple. No thyromegaly present.  Cardiovascular: Normal rate, regular rhythm and normal heart sounds.   No murmur heard. Pulmonary/Chest: Effort normal and breath sounds normal. She has no wheezes.  Abdominal: She exhibits no distension and no mass.  Musculoskeletal: She exhibits no edema.  Lymphadenopathy:    She has no cervical adenopathy.  Neurological: She is alert and oriented to person, place, and time.  Skin: Skin is warm and dry. No rash noted.  She is not diaphoretic.  Psychiatric: Memory, affect and judgment normal.    Lab Results  Component Value Date   TSH 1.410 05/14/2013   Lab Results  Component Value Date   WBC 6.0 05/14/2013   HGB 14.3 05/14/2013   HCT 41.0 05/14/2013   MCV 88.7 05/14/2013   PLT 295 05/14/2013   Lab Results  Component Value Date   CREATININE 0.79 05/14/2013   BUN 12 05/14/2013   NA 139 05/14/2013   K 4.5 05/14/2013   CL 103 05/14/2013   CO2 28 05/14/2013   Lab Results  Component Value Date   ALT 17 05/14/2013   AST 18 05/14/2013   ALKPHOS 63 05/14/2013  BILITOT 0.6 05/14/2013   Lab Results  Component Value Date   CHOL 205* 05/14/2013   Lab Results  Component Value Date   HDL 52 05/14/2013   Lab Results  Component Value Date   LDLCALC 133* 05/14/2013   Lab Results  Component Value Date   TRIG 98 05/14/2013   Lab Results  Component Value Date   CHOLHDL 3.9 05/14/2013     Assessment & Plan  Asthma Adequately controlled on current meds  ADD (attention deficit disorder) will try to switch to Vyvanse since the adderall is not lasting long enough in the am. May continue short acting Adderall in pm as needed

## 2013-08-18 NOTE — Assessment & Plan Note (Signed)
Adequately controlled on current meds

## 2013-08-18 NOTE — Assessment & Plan Note (Signed)
will try to switch to Vyvanse since the adderall is not lasting long enough in the am. May continue short acting Adderall in pm as needed

## 2013-09-08 ENCOUNTER — Other Ambulatory Visit: Payer: Self-pay | Admitting: Family Medicine

## 2013-09-13 ENCOUNTER — Encounter: Payer: Self-pay | Admitting: Family Medicine

## 2013-09-13 ENCOUNTER — Ambulatory Visit (INDEPENDENT_AMBULATORY_CARE_PROVIDER_SITE_OTHER): Payer: Managed Care, Other (non HMO) | Admitting: Family Medicine

## 2013-09-13 VITALS — BP 120/80 | HR 85 | Temp 98.1°F | Ht 65.0 in | Wt 207.1 lb

## 2013-09-13 DIAGNOSIS — F988 Other specified behavioral and emotional disorders with onset usually occurring in childhood and adolescence: Secondary | ICD-10-CM

## 2013-09-13 DIAGNOSIS — Z111 Encounter for screening for respiratory tuberculosis: Secondary | ICD-10-CM

## 2013-09-13 MED ORDER — LISDEXAMFETAMINE DIMESYLATE 60 MG PO CAPS: 60.0000 mg | ORAL_CAPSULE | ORAL | Status: DC

## 2013-09-13 NOTE — Progress Notes (Signed)
Pre visit review using our clinic review tool, if applicable. No additional management support is needed unless otherwise documented below in the visit note. 

## 2013-09-15 ENCOUNTER — Encounter: Payer: Self-pay | Admitting: Family Medicine

## 2013-09-15 NOTE — Progress Notes (Signed)
Patient ID: Kristina Watts, female   DOB: [birth year], 43 y.o.   MRN: [id number] [facility]ALLOS [id number] [birth year] 09/15/2013      Progress Note-Follow Up  Subjective  Chief Complaint  Chief Complaint  Patient presents with  . Follow-up    discuss new medication shes taking  . Injections    TB    HPI  Patient is a 43 year old female in today for routine medical care. We just like to switch to high. Says she concentrates better but not fully. Is interested in trying an extra. Denies any headaches, chest pain, palpitations, shortness of breath, GI or GU concerns with the medication change.  Past Medical History  Diagnosis Date  . Chicken pox as a child  . Pneumonia 1998  . Allergy     seasonal  . Asthma   . Depression   . Recurrent cold sores   . Depression with anxiety 04/18/2011  . 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 Overweight 04/19/2011  . GDM (gestational diabetes mellitus) 04/19/2011  . History of pancreatitis 04/19/2011  . Allergic state 04/19/2011  . Preventative health care 04/19/2011  . Asthma 04/19/2011  . Tinea corporis 05/18/2011  . Fatigue 05/30/2011  . Otitis media of right ear 05/30/2011  . Dermatitis 05/18/2011  . Pharyngitis 08/23/2011  . Migraine 08/23/2011  . Pancreatitis, recurrent 12/12/2011  . Reflux 12/12/2011  . LUQ pain 12/12/2011  . Hyperlipidemia 04/27/2012  . ADD (attention deficit disorder) 04/27/2012  . Acute bronchitis 11/19/2012    Past Surgical History  Procedure Laterality Date  . Appendectomy  2007  . Cholecystectomy  2000  . Cesarean section  1-02 and 4-04    Family History  Problem Relation Age of Onset  . Thyroid disease Mother   . Hypertension Father   . Hyperlipidemia Father   . Heart disease Father   . Diabetes Father   . COPD Father     previous smoker  . Stroke Father   . Depression Father   . Restless legs syndrome Father   . Diabetes Brother   . Cancer Maternal Grandmother     Head and Neck  .  Heart disease Paternal Grandmother   . Hyperlipidemia Paternal Grandmother   . Hypertension Paternal Grandmother   . Diabetes Paternal Grandmother   . Depression Paternal Grandmother   . Stroke Paternal Grandmother   . Restless legs syndrome Paternal Grandmother   . Heart disease Paternal Grandfather   . Hyperlipidemia Paternal Grandfather   . Hypertension Paternal Grandfather   . Stroke Paternal Grandfather   . Diabetes Paternal Grandfather     History   Social History  . Marital Status: Married    Spouse Name: N/A    Number of Children: N/A  . Years of Education: N/A   Occupational History  . Not on file.   Social History Main Topics  . Smoking status: Never Smoker   . Smokeless tobacco: Never Used  . Alcohol Use: Yes     Comment: rarely  . Drug Use: No  . Sexual Activity: Yes    Partners: Male   Other Topics Concern  . Not on file   Social History Narrative  . No narrative on file    Current Outpatient Prescriptions on File Prior to Visit  Medication Sig Dispense Refill  . Acetaminophen (TYLENOL EXTRA STRENGTH PO) Take 2 tablets by mouth as needed.      Marland Kitchen albuterol (PROVENTIL HFA;VENTOLIN HFA) 108 (90 BASE) MCG/ACT inhaler Inhale  2 puffs into the lungs as needed.        Marland Kitchen amphetamine-dextroamphetamine (ADDERALL) 10 MG tablet Take 1 tablet (10 mg total) by mouth 2 (two) times daily as needed. In pm  March 2015 rx  30 tablet  0  . Butalbital-APAP-Caffeine 50-300-40 MG CAPS Take 1 capsule by mouth 3 (three) times daily as needed. HA  84 capsule  1  . citalopram (CELEXA) 10 MG tablet Take 1 tablet (10 mg total) by mouth 2 (two) times daily.  60 tablet  3  . ibuprofen (ADVIL,MOTRIN) 200 MG tablet Take 200 mg by mouth every 6 (six) hours as needed.      . montelukast (SINGULAIR) 10 MG tablet TAKE 1 TABLET (10 MG TOTAL) BY MOUTH AT BEDTIME.  30 tablet  3  . Multiple Vitamin (MULTIVITAMIN) tablet Take 1 tablet by mouth daily.      . mupirocin ointment (BACTROBAN) 2 %  Place 1 application into the nose 2 (two) times daily. Via clean qtip x 7 days  22 g  1  . penciclovir (DENAVIR) 1 % cream Apply topically every 2 (two) hours.  1.5 g  1  . SYMBICORT 160-4.5 MCG/ACT inhaler INHALE 2 PUFFS INTO THE LUNGS 2 (TWO) TIMES DAILY AS NEEDED.  10.2 g  4  . valACYclovir (VALTREX) 1000 MG tablet TAKE 1 TABLET BY MOUTH TWICE A DAY  60 tablet  1   No current facility-administered medications on file prior to visit.    Allergies  Allergen Reactions  . Actifed [Triprolidine-Pse] Hives    Review of Systems  Review of Systems  Constitutional: Negative for fever and malaise/fatigue.  HENT: Negative for congestion.   Eyes: Negative for discharge.  Respiratory: Negative for shortness of breath.   Cardiovascular: Negative for chest pain, palpitations and leg swelling.  Gastrointestinal: Negative for nausea, abdominal pain and diarrhea.  Genitourinary: Negative for dysuria.  Musculoskeletal: Negative for falls.  Skin: Negative for rash.  Neurological: Negative for loss of consciousness and headaches.  Endo/Heme/Allergies: Negative for polydipsia.  Psychiatric/Behavioral: Negative for depression and suicidal ideas. The patient is not nervous/anxious and does not have insomnia.     Objective  BP 120/80  Pulse 85  Temp(Src) 98.1 F (36.7 C) (Oral)  Ht 5\' 5"  (2.831 m)  Wt 207 lb 1.3 oz (93.931 kg)  BMI 34.46 kg/m2  SpO2 97%  LMP 08/28/2013  Physical Exam  Physical Exam  Constitutional: She is oriented to person, place, and time and well-developed, well-nourished, and in no distress. No distress.  HENT:  Head: Normocephalic and atraumatic.  Eyes: Conjunctivae are normal.  Neck: Neck supple. No thyromegaly present.  Cardiovascular: Normal rate, regular rhythm and normal heart sounds.   No murmur heard. Pulmonary/Chest: Effort normal and breath sounds normal. She has no wheezes.  Abdominal: She exhibits no distension and no mass.  Musculoskeletal: She  exhibits no edema.  Lymphadenopathy:    She has no cervical adenopathy.  Neurological: She is alert and oriented to person, place, and time.  Skin: Skin is warm and dry. No rash noted. She is not diaphoretic.  Psychiatric: Memory, affect and judgment normal.    Lab Results  Component Value Date   TSH 1.410 05/14/2013   Lab Results  Component Value Date   WBC 6.0 05/14/2013   HGB 14.3 05/14/2013   HCT 41.0 05/14/2013   MCV 88.7 05/14/2013   PLT 295 05/14/2013   Lab Results  Component Value Date   CREATININE 0.79 05/14/2013  BUN 12 05/14/2013   NA 139 05/14/2013   K 4.5 05/14/2013   CL 103 05/14/2013   CO2 28 05/14/2013   Lab Results  Component Value Date   ALT 17 05/14/2013   AST 18 05/14/2013   ALKPHOS 63 05/14/2013   BILITOT 0.6 05/14/2013   Lab Results  Component Value Date   CHOL 205* 05/14/2013   Lab Results  Component Value Date   HDL 52 05/14/2013   Lab Results  Component Value Date   LDLCALC 133* 05/14/2013   Lab Results  Component Value Date   TRIG 98 05/14/2013   Lab Results  Component Value Date   CHOLHDL 3.9 05/14/2013     Assessment & Plan  ADD (attention deficit disorder) Good but incomplete response to vyvanse will increase to 60 mg and reassess next week.

## 2013-09-15 NOTE — Assessment & Plan Note (Signed)
Good but incomplete response to vyvanse will increase to 60 mg and reassess next week.

## 2013-09-16 ENCOUNTER — Ambulatory Visit: Payer: Managed Care, Other (non HMO)

## 2013-09-16 VITALS — BP 118/82 | HR 76

## 2013-09-16 DIAGNOSIS — F988 Other specified behavioral and emotional disorders with onset usually occurring in childhood and adolescence: Secondary | ICD-10-CM

## 2013-09-16 LAB — TB SKIN TEST
Induration: 60 mm
TB SKIN TEST: NEGATIVE

## 2013-09-16 MED ORDER — LISDEXAMFETAMINE DIMESYLATE 60 MG PO CAPS: 60.0000 mg | ORAL_CAPSULE | ORAL | Status: DC

## 2013-09-16 NOTE — Progress Notes (Signed)
Pre visit review using our clinic review tool, if applicable. No additional management support is needed unless otherwise documented below in the visit note. 

## 2013-10-18 ENCOUNTER — Ambulatory Visit: Payer: Managed Care, Other (non HMO) | Admitting: Family Medicine

## 2013-11-07 ENCOUNTER — Ambulatory Visit (INDEPENDENT_AMBULATORY_CARE_PROVIDER_SITE_OTHER): Payer: Managed Care, Other (non HMO) | Admitting: Family Medicine

## 2013-11-07 ENCOUNTER — Encounter: Payer: Self-pay | Admitting: Family Medicine

## 2013-11-07 VITALS — BP 117/75 | HR 78 | Temp 98.1°F | Resp 18 | Ht 65.0 in | Wt 208.0 lb

## 2013-11-07 DIAGNOSIS — F418 Other specified anxiety disorders: Secondary | ICD-10-CM

## 2013-11-07 DIAGNOSIS — E663 Overweight: Secondary | ICD-10-CM

## 2013-11-07 DIAGNOSIS — M549 Dorsalgia, unspecified: Secondary | ICD-10-CM

## 2013-11-07 DIAGNOSIS — J45909 Unspecified asthma, uncomplicated: Secondary | ICD-10-CM

## 2013-11-07 DIAGNOSIS — F988 Other specified behavioral and emotional disorders with onset usually occurring in childhood and adolescence: Secondary | ICD-10-CM

## 2013-11-07 DIAGNOSIS — F341 Dysthymic disorder: Secondary | ICD-10-CM

## 2013-11-07 HISTORY — DX: Dorsalgia, unspecified: M54.9

## 2013-11-07 MED ORDER — BUDESONIDE-FORMOTEROL FUMARATE 160-4.5 MCG/ACT IN AERO: 2.0000 | INHALATION_SPRAY | Freq: Two times a day (BID) | RESPIRATORY_TRACT | Status: DC

## 2013-11-07 MED ORDER — ALBUTEROL SULFATE HFA 108 (90 BASE) MCG/ACT IN AERS: 2.0000 | INHALATION_SPRAY | RESPIRATORY_TRACT | Status: DC | PRN

## 2013-11-07 MED ORDER — LISDEXAMFETAMINE DIMESYLATE 70 MG PO CAPS: 70.0000 mg | ORAL_CAPSULE | Freq: Every day | ORAL | Status: DC

## 2013-11-07 MED ORDER — AMPHETAMINE-DEXTROAMPHETAMINE 10 MG PO TABS: 10.0000 mg | ORAL_TABLET | Freq: Every day | ORAL | Status: DC | PRN

## 2013-11-07 NOTE — Progress Notes (Signed)
Patient ID: Kristina Watts, female   DOB: [birth year], 43 y.o.   MRN: [id number] [facility] [id number] [birth year] 11/07/2013      Progress Note-Follow Up  Subjective  Chief Complaint  Chief Complaint  Patient presents with  . Follow-up    ADD    HPI  Patient is a 43 year old female in today for routine medical care. She continues to have a hectic schedule with school, work, children and a 8 has been. It is managing but does acknowledge fatigue, depression and anxiety get hurt times. Does feel that by that helped some but she is troubled initially awakening and becoming alert after that no trouble. No headaches, anorexia, palpitations et Ronney Asters. Denies CP/palp/SOB/HA/congestion/fevers/GI or GU c/o. Taking meds as prescribed  Past Medical History  Diagnosis Date  . Chicken pox as a child  . Pneumonia 1998  . Allergy     seasonal  . Asthma   . Depression   . Recurrent cold sores   . Depression with anxiety 04/18/2011  . 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 Overweight 04/19/2011  . GDM (gestational diabetes mellitus) 04/19/2011  . History of pancreatitis 04/19/2011  . Allergic state 04/19/2011  . Preventative health care 04/19/2011  . Asthma 04/19/2011  . Tinea corporis 05/18/2011  . Fatigue 05/30/2011  . Otitis media of right ear 05/30/2011  . Dermatitis 05/18/2011  . Pharyngitis 08/23/2011  . Migraine 08/23/2011  . Pancreatitis, recurrent 12/12/2011  . Reflux 12/12/2011  . LUQ pain 12/12/2011  . Hyperlipidemia 04/27/2012  . ADD (attention deficit disorder) 04/27/2012  . Acute bronchitis 11/19/2012  . Back pain 11/07/2013    Past Surgical History  Procedure Laterality Date  . Appendectomy  2007  . Cholecystectomy  2000  . Cesarean section  1-02 and 4-04    Family History  Problem Relation Age of Onset  . Thyroid disease Mother   . Hypertension Father   . Hyperlipidemia Father   . Heart disease Father   . Diabetes Father   . COPD Father     previous  smoker  . Stroke Father   . Depression Father   . Restless legs syndrome Father   . Diabetes Brother   . Cancer Maternal Grandmother     Head and Neck  . Heart disease Paternal Grandmother   . Hyperlipidemia Paternal Grandmother   . Hypertension Paternal Grandmother   . Diabetes Paternal Grandmother   . Depression Paternal Grandmother   . Stroke Paternal Grandmother   . Restless legs syndrome Paternal Grandmother   . Heart disease Paternal Grandfather   . Hyperlipidemia Paternal Grandfather   . Hypertension Paternal Grandfather   . Stroke Paternal Grandfather   . Diabetes Paternal Grandfather     History   Social History  . Marital Status: Married    Spouse Name: N/A    Number of Children: N/A  . Years of Education: N/A   Occupational History  . Not on file.   Social History Main Topics  . Smoking status: Never Smoker   . Smokeless tobacco: Never Used  . Alcohol Use: Yes     Comment: rarely  . Drug Use: No  . Sexual Activity: Yes    Partners: Male   Other Topics Concern  . Not on file   Social History Narrative  . No narrative on file    Current Outpatient Prescriptions on File Prior to Visit  Medication Sig Dispense Refill  . Acetaminophen (TYLENOL EXTRA STRENGTH PO) Take 2  tablets by mouth as needed.      . Butalbital-APAP-Caffeine 50-300-40 MG CAPS Take 1 capsule by mouth 3 (three) times daily as needed. HA  84 capsule  1  . citalopram (CELEXA) 10 MG tablet Take 1 tablet (10 mg total) by mouth 2 (two) times daily.  60 tablet  3  . ibuprofen (ADVIL,MOTRIN) 200 MG tablet Take 200 mg by mouth every 6 (six) hours as needed.      . montelukast (SINGULAIR) 10 MG tablet TAKE 1 TABLET (10 MG TOTAL) BY MOUTH AT BEDTIME.  30 tablet  3  . Multiple Vitamin (MULTIVITAMIN) tablet Take 1 tablet by mouth daily.      . mupirocin ointment (BACTROBAN) 2 % Place 1 application into the nose 2 (two) times daily. Via clean qtip x 7 days  22 g  1  . penciclovir (DENAVIR) 1 % cream  Apply topically every 2 (two) hours.  1.5 g  1  . valACYclovir (VALTREX) 1000 MG tablet TAKE 1 TABLET BY MOUTH TWICE A DAY  60 tablet  1   No current facility-administered medications on file prior to visit.    Allergies  Allergen Reactions  . Actifed [Triprolidine-Pse] Hives    Review of Systems  Review of Systems  Constitutional: Positive for malaise/fatigue. Negative for fever.  HENT: Negative for congestion.   Eyes: Negative for discharge.  Respiratory: Positive for wheezing. Negative for shortness of breath.   Cardiovascular: Negative for chest pain, palpitations and leg swelling.  Gastrointestinal: Negative for nausea, abdominal pain and diarrhea.  Genitourinary: Negative for dysuria.  Musculoskeletal: Negative for falls.  Skin: Negative for rash.  Neurological: Negative for loss of consciousness and headaches.  Endo/Heme/Allergies: Negative for polydipsia.  Psychiatric/Behavioral: Positive for depression. Negative for suicidal ideas. The patient is nervous/anxious. The patient does not have insomnia.     Objective  BP 117/75  Pulse 78  Temp(Src) 98.1 F (36.7 C) (Oral)  Resp 18  Ht 5\' 5"  (1.651 m)  Wt 208 lb (94.348 kg)  BMI 34.61 kg/m2  SpO2 99%  Physical Exam  Physical Exam  Constitutional: She is oriented to person, place, and time and well-developed, well-nourished, and in no distress. No distress.  HENT:  Head: Normocephalic and atraumatic.  Eyes: Conjunctivae are normal.  Neck: Neck supple. No thyromegaly present.  Cardiovascular: Normal rate, regular rhythm and normal heart sounds.   No murmur heard. Pulmonary/Chest: Effort normal and breath sounds normal. She has no wheezes.  Abdominal: She exhibits no distension and no mass.  Musculoskeletal: She exhibits no edema.  Lymphadenopathy:    She has no cervical adenopathy.  Neurological: She is alert and oriented to person, place, and time.  Skin: Skin is warm and dry. No rash noted. She is not  diaphoretic.  Psychiatric: Memory, affect and judgment normal.    Lab Results  Component Value Date   TSH 1.410 05/14/2013   Lab Results  Component Value Date   WBC 6.0 05/14/2013   HGB 14.3 05/14/2013   HCT 41.0 05/14/2013   MCV 88.7 05/14/2013   PLT 295 05/14/2013   Lab Results  Component Value Date   CREATININE 0.79 05/14/2013   BUN 12 05/14/2013   NA 139 05/14/2013   K 4.5 05/14/2013   CL 103 05/14/2013   CO2 28 05/14/2013   Lab Results  Component Value Date   ALT 17 05/14/2013   AST 18 05/14/2013   ALKPHOS 63 05/14/2013   BILITOT 0.6 05/14/2013   Lab Results  Component Value Date   CHOL 205* 05/14/2013   Lab Results  Component Value Date   HDL 52 05/14/2013   Lab Results  Component Value Date   LDLCALC 133* 05/14/2013   Lab Results  Component Value Date   TRIG 98 05/14/2013   Lab Results  Component Value Date   CHOLHDL 3.9 05/14/2013     Assessment & Plan  ADD (attention deficit disorder) Vyvanse not helping as much in the am to wake her up. Will try increasing to Vyvanse 70 mg daily and may use Adderall 10 mg daily prn for breakthrough symptoms  Depression with anxiety Managing her stressors well with her current meds. Continue current meds.   Back pain Encouraged moist heat and gentle stretching as tolerated. May try NSAIDs and prescription meds as directed and report if symptoms worsen or seek immediate care. Try salon pas or icy hot  Asthma Wheezing slightly today, allergic component, will restart Zyrtec and given refills on Symbicort and Albuterol. Call if worsens for a Medrol dosepak  Overweight Encouraged DASH diet, decrease po intake and increase exercise as tolerated. Needs 7-8 hours of sleep nightly. Avoid trans fats, eat small, frequent meals every 4-5 hours with lean proteins, complex carbs and healthy fats. Minimize simple carbs, GMO foods.

## 2013-11-07 NOTE — Assessment & Plan Note (Signed)
Vyvanse not helping as much in the am to wake her up. Will try increasing to Vyvanse 70 mg daily and may use Adderall 10 mg daily prn for breakthrough symptoms

## 2013-11-07 NOTE — Assessment & Plan Note (Signed)
Encouraged moist heat and gentle stretching as tolerated. May try NSAIDs and prescription meds as directed and report if symptoms worsen or seek immediate care. Try salon pas or icy hot

## 2013-11-07 NOTE — Assessment & Plan Note (Signed)
Wheezing slightly today, allergic component, will restart Zyrtec and given refills on Symbicort and Albuterol. Call if worsens for a Medrol dosepak

## 2013-11-07 NOTE — Patient Instructions (Addendum)
Consider Salon Pas or Icy Hot patches Encouraged moist heat and gentle stretching as tolerated. May try NSAIDs and prescription meds as directed and report if symptoms worsen or seek immediate care   Attention Deficit Hyperactivity Disorder Attention deficit hyperactivity disorder (ADHD) is a problem with behavior issues based on the way the brain functions (neurobehavioral disorder). It is a common reason for behavior and academic problems in school. SYMPTOMS  There are 3 types of ADHD. The 3 types and some of the symptoms include:  Inattentive  Gets bored or distracted easily.  Loses or forgets things. Forgets to hand in homework.  Has trouble organizing or completing tasks.  Difficulty staying on task.  An inability to organize daily tasks and school work.  Leaving projects, chores, or homework unfinished.  Trouble paying attention or responding to details. Careless mistakes.  Difficulty following directions. Often seems like is not listening.  Dislikes activities that require sustained attention (like chores or homework).  Hyperactive-impulsive  Feels like it is impossible to sit still or stay in a seat. Fidgeting with hands and feet.  Trouble waiting turn.  Talking too much or out of turn. Interruptive.  Speaks or acts impulsively.  Aggressive, disruptive behavior.  Constantly busy or on the go, noisy.  Often leaves seat when they are expected to remain seated.  Often runs or climbs where it is not appropriate, or feels very restless.  Combined  Has symptoms of both of the above. Often children with ADHD feel discouraged about themselves and with school. They often perform well below their abilities in school. As children get older, the excess motor activities can calm down, but the problems with paying attention and staying organized persist. Most children do not outgrow ADHD but with good treatment can learn to cope with the symptoms. DIAGNOSIS  When ADHD  is suspected, the diagnosis should be made by professionals trained in ADHD. This professional will collect information about the individual suspected of having ADHD. Information must be collected from various settings where the person lives, works, or attends school.  Diagnosis will include:  Confirming symptoms began in childhood.  Ruling out other reasons for the child's behavior.  The health care providers will check with the child's school and check their medical records.  They will talk to teachers and parents.  Behavior rating scales for the child will be filled out by those dealing with the child on a daily basis. A diagnosis is made only after all information has been considered. TREATMENT  Treatment usually includes behavioral treatment, tutoring or extra support in school, and stimulant medicines. Because of the way a person's brain works with ADHD, these medicines decrease impulsivity and hyperactivity and increase attention. This is different than how they would work in a person who does not have ADHD. Other medicines used include antidepressants and certain blood pressure medicines. Most experts agree that treatment for ADHD should address all aspects of the person's functioning. Along with medicines, treatment should include structured classroom management at school. Parents should reward good behavior, provide constant discipline, and limit-setting. Tutoring should be available for the child as needed. ADHD is a life-long condition. If untreated, the disorder can have long-term serious effects into adolescence and adulthood. HOME CARE INSTRUCTIONS   Often with ADHD there is a lot of frustration among family members dealing with the condition. Blame and anger are also feelings that are common. In many cases, because the problem affects the family as a whole, the entire family may need help.  A therapist can help the family find better ways to handle the disruptive behaviors of the  person with ADHD and promote change. If the person with ADHD is young, most of the therapist's work is with the parents. Parents will learn techniques for coping with and improving their child's behavior. Sometimes only the child with the ADHD needs counseling. Your health care providers can help you make these decisions.  Children with ADHD may need help learning how to organize. Some helpful tips include:  Keep routines the same every day from wake-up time to bedtime. Schedule all activities, including homework and playtime. Keep the schedule in a place where the person with ADHD will often see it. Mark schedule changes as far in advance as possible.  Schedule outdoor and indoor recreation.  Have a place for everything and keep everything in its place. This includes clothing, backpacks, and school supplies.  Encourage writing down assignments and bringing home needed books. Work with your child's teachers for assistance in organizing school work.  Offer your child a well-balanced diet. Breakfast that includes a balance of whole grains, protein and, fruits or vegetables is especially important for school performance. Children should avoid drinks with caffeine including:  Soft drinks.  Coffee.  Tea.  However, some older children (adolescents) may find these drinks helpful in improving their attention. Because it can also be common for adolescents with ADHD to become addicted to caffeine, talk with your health care provider about what is a safe amount of caffeine intake for your child.  Children with ADHD need consistent rules that they can understand and follow. If rules are followed, give small rewards. Children with ADHD often receive, and expect, criticism. Look for good behavior and praise it. Set realistic goals. Give clear instructions. Look for activities that can foster success and self-esteem. Make time for pleasant activities with your child. Give lots of affection.  Parents are  their children's greatest advocates. Learn as much as possible about ADHD. This helps you become a stronger and better advocate for your child. It also helps you educate your child's teachers and instructors if they feel inadequate in these areas. Parent support groups are often helpful. A national group with local chapters is called Children and Adults with Attention Deficit Hyperactivity Disorder (CHADD). SEEK MEDICAL CARE IF:  Your child has repeated muscle twitches, cough or speech outbursts.  Your child has sleep problems.  Your child has a marked loss of appetite.  Your child develops depression.  Your child has new or worsening behavioral problems.  Your child develops dizziness.  Your child has a racing heart.  Your child has stomach pains.  Your child develops headaches. SEEK IMMEDIATE MEDICAL CARE IF:  Your child has been diagnosed with depression or anxiety and the symptoms seem to be getting worse.  Your child has been depressed and suddenly appears to have increased energy or motivation.  You are worried that your child is having a bad reaction to a medication he or she is taking for ADHD. Document Released: 05/27/2002 Document Revised: 03/27/2013 Document Reviewed: 02/11/2013 [facility] Patient Information 2014 [place].   Back Pain, Adult Low back pain is very common. About 1 in 5 people have back pain.The cause of low back pain is rarely dangerous. The pain often gets better over time.About half of people with a sudden onset of back pain feel better in just 2 weeks. About 8 in 10 people feel better by 6 weeks.  CAUSES Some common causes of back  pain include:  Strain of the muscles or ligaments supporting the spine.  Wear and tear (degeneration) of the spinal discs.  Arthritis.  Direct injury to the back. DIAGNOSIS Most of the time, the direct cause of low back pain is not known.However, back pain can be treated effectively even when the exact cause  of the pain is unknown.Answering your caregiver's questions about your overall health and symptoms is one of the most accurate ways to make sure the cause of your pain is not dangerous. If your caregiver needs more information, he or she may order lab work or imaging tests (X-rays or MRIs).However, even if imaging tests show changes in your back, this usually does not require surgery. HOME CARE INSTRUCTIONS For many people, back pain returns.Since low back pain is rarely dangerous, it is often a condition that people can learn to [facility] their own.   Remain active. It is stressful on the back to sit or stand in one place. Do not sit, drive, or stand in one place for more than 30 minutes at a time. Take short walks on level surfaces as soon as pain allows.Try to increase the length of time you walk each day.  Do not stay in bed.Resting more than 1 or 2 days can delay your recovery.  Do not avoid exercise or work.Your body is made to move.It is not dangerous to be active, even though your back may hurt.Your back will likely heal faster if you return to being active before your pain is gone.  Pay attention to your body when you bend and lift. Many people have less discomfortwhen lifting if they bend their knees, keep the load close to their bodies,and avoid twisting. Often, the most comfortable positions are those that put less stress on your recovering back.  Find a comfortable position to sleep. Use a firm mattress and lie on your side with your knees slightly bent. If you lie on your back, put a pillow under your knees.  Only take over-the-counter or prescription medicines as directed by your caregiver. Over-the-counter medicines to reduce pain and inflammation are often the most helpful.Your caregiver may prescribe muscle relaxant drugs.These medicines help dull your pain so you can more quickly return to your normal activities and healthy exercise.  Put ice on the injured  area.  Put ice in a plastic bag.  Place a towel between your skin and the bag.  Leave the ice on for 15-20 minutes, 03-04 times a day for the first 2 to 3 days. After that, ice and heat may be alternated to reduce pain and spasms.  Ask your caregiver about trying back exercises and gentle massage. This may be of some benefit.  Avoid feeling anxious or stressed.Stress increases muscle tension and can worsen back pain.It is important to recognize when you are anxious or stressed and learn ways to manage it.Exercise is a great option. SEEK MEDICAL CARE IF:  You have pain that is not relieved with rest or medicine.  You have pain that does not improve in 1 week.  You have new symptoms.  You are generally not feeling well. SEEK IMMEDIATE MEDICAL CARE IF:   You have pain that radiates from your back into your legs.  You develop new bowel or bladder control problems.  You have unusual weakness or numbness in your arms or legs.  You develop nausea or vomiting.  You develop abdominal pain.  You feel faint. Document Released: 06/06/2005 Document Revised: 12/06/2011 Document Reviewed: 10/25/2010 [employer] Patient  Information 2014 [employer].  

## 2013-11-07 NOTE — Assessment & Plan Note (Signed)
Managing her stressors well with her current meds. Continue current meds.

## 2013-11-07 NOTE — Progress Notes (Signed)
Pre visit review using our clinic review tool, if applicable. No additional management support is needed unless otherwise documented below in the visit note. 

## 2013-11-11 NOTE — Assessment & Plan Note (Signed)
Encouraged DASH diet, decrease po intake and increase exercise as tolerated. Needs 7-8 hours of sleep nightly. Avoid trans fats, eat small, frequent meals every 4-5 hours with lean proteins, complex carbs and healthy fats. Minimize simple carbs, GMO foods. 

## 2013-12-03 ENCOUNTER — Ambulatory Visit: Payer: Managed Care, Other (non HMO) | Admitting: Family Medicine

## 2013-12-03 DIAGNOSIS — Z0289 Encounter for other administrative examinations: Secondary | ICD-10-CM

## 2013-12-04 ENCOUNTER — Ambulatory Visit: Payer: Managed Care, Other (non HMO) | Admitting: Physician Assistant

## 2013-12-05 ENCOUNTER — Ambulatory Visit (INDEPENDENT_AMBULATORY_CARE_PROVIDER_SITE_OTHER): Payer: Managed Care, Other (non HMO) | Admitting: Physician Assistant

## 2013-12-05 ENCOUNTER — Encounter: Payer: Self-pay | Admitting: Physician Assistant

## 2013-12-05 VITALS — BP 110/78 | HR 86 | Temp 98.0°F | Resp 16 | Ht 65.0 in | Wt 206.0 lb

## 2013-12-05 DIAGNOSIS — F988 Other specified behavioral and emotional disorders with onset usually occurring in childhood and adolescence: Secondary | ICD-10-CM

## 2013-12-05 MED ORDER — AMPHETAMINE-DEXTROAMPHETAMINE 10 MG PO TABS: 10.0000 mg | ORAL_TABLET | Freq: Every day | ORAL | Status: DC | PRN

## 2013-12-05 MED ORDER — LISDEXAMFETAMINE DIMESYLATE 70 MG PO CAPS: 70.0000 mg | ORAL_CAPSULE | Freq: Every day | ORAL | Status: DC

## 2013-12-05 NOTE — Patient Instructions (Signed)
Please continue medications as directed.  Follow-up with Dr. Charlett Blake as scheduled.  Return sooner if needed.

## 2013-12-05 NOTE — Progress Notes (Signed)
Pre visit review using our clinic review tool, if applicable. No additional management support is needed unless otherwise documented below in the visit note/SLS  

## 2013-12-08 NOTE — Assessment & Plan Note (Signed)
Well-controlled on current regimen.  Medications refilled -- 1 month supply.  Future refills will need to come from PCP.

## 2013-12-08 NOTE — Progress Notes (Signed)
Patient presents to clinic today for medication management of ADD.  Patient has been on Vyvanse 70 mg and Adderall 10 mg daily.  Endorses good relief of symptoms.  Endorses good focus at school.  Denies chest pain, palpitations, anorexia or insomnia.  Is finishing up a heavy course load during her school's first summer session.  Past Medical History  Diagnosis Date  . Chicken pox as a child  . Pneumonia 1998  . Allergy     seasonal  . Asthma   . Depression   . Recurrent cold sores   . Depression with anxiety 04/18/2011  . 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 Overweight(278.02) 04/19/2011  . GDM (gestational diabetes mellitus) 04/19/2011  . History of pancreatitis 04/19/2011  . Allergic state 04/19/2011  . Preventative health care 04/19/2011  . Asthma 04/19/2011  . Tinea corporis 05/18/2011  . Fatigue 05/30/2011  . Otitis media of right ear 05/30/2011  . Dermatitis 05/18/2011  . Pharyngitis 08/23/2011  . Migraine 08/23/2011  . Pancreatitis, recurrent 12/12/2011  . Reflux 12/12/2011  . LUQ pain 12/12/2011  . Hyperlipidemia 04/27/2012  . ADD (attention deficit disorder) 04/27/2012  . Acute bronchitis 11/19/2012  . Back pain 11/07/2013    Current Outpatient Prescriptions on File Prior to Visit  Medication Sig Dispense Refill  . Acetaminophen (TYLENOL EXTRA STRENGTH PO) Take 2 tablets by mouth as needed.      Marland Kitchen albuterol (PROVENTIL HFA;VENTOLIN HFA) 108 (90 BASE) MCG/ACT inhaler Inhale 2 puffs into the lungs every 4 (four) hours as needed for wheezing or shortness of breath.  18 g  5  . budesonide-formoterol (SYMBICORT) 160-4.5 MCG/ACT inhaler Inhale 2 puffs into the lungs 2 (two) times daily.  10.2 g  4  . Butalbital-APAP-Caffeine 50-300-40 MG CAPS Take 1 capsule by mouth 3 (three) times daily as needed. HA  84 capsule  1  . citalopram (CELEXA) 10 MG tablet Take 1 tablet (10 mg total) by mouth 2 (two) times daily.  60 tablet  3  . ibuprofen (ADVIL,MOTRIN) 200 MG tablet Take 200  mg by mouth every 6 (six) hours as needed.      . montelukast (SINGULAIR) 10 MG tablet TAKE 1 TABLET (10 MG TOTAL) BY MOUTH AT BEDTIME.  30 tablet  3  . Multiple Vitamin (MULTIVITAMIN) tablet Take 1 tablet by mouth daily.      . mupirocin ointment (BACTROBAN) 2 % Place 1 application into the nose 2 (two) times daily. Via clean qtip x 7 days  22 g  1  . penciclovir (DENAVIR) 1 % cream Apply topically every 2 (two) hours.  1.5 g  1  . valACYclovir (VALTREX) 1000 MG tablet TAKE 1 TABLET BY MOUTH TWICE A DAY  60 tablet  1   No current facility-administered medications on file prior to visit.    Allergies  Allergen Reactions  . Actifed [Triprolidine-Pse] Hives    Family History  Problem Relation Age of Onset  . Thyroid disease Mother   . Hypertension Father   . Hyperlipidemia Father   . Heart disease Father   . Diabetes Father   . COPD Father     previous smoker  . Stroke Father   . Depression Father   . Restless legs syndrome Father   . Diabetes Brother   . Cancer Maternal Grandmother     Head and Neck  . Heart disease Paternal Grandmother   . Hyperlipidemia Paternal Grandmother   . Hypertension Paternal Grandmother   . Diabetes Paternal Grandmother   .  Depression Paternal Grandmother   . Stroke Paternal Grandmother   . Restless legs syndrome Paternal Grandmother   . Heart disease Paternal Grandfather   . Hyperlipidemia Paternal Grandfather   . Hypertension Paternal Grandfather   . Stroke Paternal Grandfather   . Diabetes Paternal Grandfather     History   Social History  . Marital Status: Married    Spouse Name: N/A    Number of Children: N/A  . Years of Education: N/A   Social History Main Topics  . Smoking status: Never Smoker   . Smokeless tobacco: Never Used  . Alcohol Use: Yes     Comment: rarely  . Drug Use: No  . Sexual Activity: Yes    Partners: Male   Other Topics Concern  . None   Social History Narrative  . None   Review of Systems - See HPI.   All other ROS are negative.  BP 110/78  Pulse 86  Temp(Src) 98 F (36.7 C) (Oral)  Resp 16  Ht 5\' 5"  (1.651 m)  Wt 206 lb (93.441 kg)  BMI 34.28 kg/m2  SpO2 98%  LMP 11/21/2013  Physical Exam  Vitals reviewed. Constitutional: She is oriented to person, place, and time and well-developed, well-nourished, and in no distress.  HENT:  Head: Normocephalic and atraumatic.  Eyes: Conjunctivae and EOM are normal. Pupils are equal, round, and reactive to light.  Neck: Neck supple.  Cardiovascular: Normal rate, regular rhythm, normal heart sounds and intact distal pulses.   Pulmonary/Chest: Effort normal and breath sounds normal. No respiratory distress. She has no wheezes. She has no rales. She exhibits no tenderness.  Neurological: She is alert and oriented to person, place, and time.  Skin: Skin is warm and dry. No rash noted.  Psychiatric: Affect normal.   Recent Results (from the past 2160 hour(s))  TB SKIN TEST     Status: Normal   Collection Time    09/16/13 10:36 AM      Result Value Ref Range   TB Skin Test Negative     Induration 60 hours      Assessment/Plan: ADD (attention deficit disorder) Well-controlled on current regimen.  Medications refilled -- 1 month supply.  Future refills will need to come from PCP.

## 2014-02-04 ENCOUNTER — Ambulatory Visit (INDEPENDENT_AMBULATORY_CARE_PROVIDER_SITE_OTHER): Payer: Managed Care, Other (non HMO) | Admitting: Family Medicine

## 2014-02-04 ENCOUNTER — Encounter: Payer: Self-pay | Admitting: Family Medicine

## 2014-02-04 VITALS — BP 108/72 | HR 97 | Temp 98.7°F | Ht 65.0 in | Wt 212.1 lb

## 2014-02-04 DIAGNOSIS — J4520 Mild intermittent asthma, uncomplicated: Secondary | ICD-10-CM

## 2014-02-04 DIAGNOSIS — J45909 Unspecified asthma, uncomplicated: Secondary | ICD-10-CM

## 2014-02-04 DIAGNOSIS — F341 Dysthymic disorder: Secondary | ICD-10-CM

## 2014-02-04 DIAGNOSIS — F988 Other specified behavioral and emotional disorders with onset usually occurring in childhood and adolescence: Secondary | ICD-10-CM

## 2014-02-04 DIAGNOSIS — F418 Other specified anxiety disorders: Secondary | ICD-10-CM

## 2014-02-04 MED ORDER — LISDEXAMFETAMINE DIMESYLATE 70 MG PO CAPS: 70.0000 mg | ORAL_CAPSULE | Freq: Every day | ORAL | Status: DC

## 2014-02-04 MED ORDER — AMPHETAMINE-DEXTROAMPHETAMINE 10 MG PO TABS: 10.0000 mg | ORAL_TABLET | ORAL | Status: DC

## 2014-02-04 NOTE — Progress Notes (Signed)
Pre visit review using our clinic review tool, if applicable. No additional management support is needed unless otherwise documented below in the visit note. 

## 2014-02-04 NOTE — Patient Instructions (Signed)

## 2014-02-09 ENCOUNTER — Encounter: Payer: Self-pay | Admitting: Family Medicine

## 2014-02-09 NOTE — Assessment & Plan Note (Signed)
Patient continues to manage well despite numerous stressors including children, ill husband and father, new job as Visual merchandiser and being in school will call if any new concerns

## 2014-02-09 NOTE — Progress Notes (Signed)
Patient ID: Kristina Watts, female   DOB: 1971/04/25, 43 y.o.   MRN: [id number] KIERRIA FEIGENBAUM [id number] August 01, 1970 02/09/2014      Progress Note-Follow Up  Subjective  Chief Complaint  Chief Complaint  Patient presents with  . Follow-up    HPI  Patient is a 43 year old female in today for routine medical care. Doing well. 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 Despite her very hectic schedule she reports her current meds for ADD and Depression and Anxiety are working well no anxiety attacks or suicidal ideation. Well controlled, no changes to meds. Encouraged heart healthy diet such as the DASH diet and exercise as tolerated.   Past Medical History  Diagnosis Date  . Chicken pox as a child  . Pneumonia 1998  . Allergy     seasonal  . Asthma   . Depression   . Recurrent cold sores   . Depression with anxiety 04/18/2011  . 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 Overweight(278.02) 04/19/2011  . GDM (gestational diabetes mellitus) 04/19/2011  . History of pancreatitis 04/19/2011  . Allergic state 04/19/2011  . Preventative health care 04/19/2011  . Asthma 04/19/2011  . Tinea corporis 05/18/2011  . Fatigue 05/30/2011  . Otitis media of right ear 05/30/2011  . Dermatitis 05/18/2011  . Pharyngitis 08/23/2011  . Migraine 08/23/2011  . Pancreatitis, recurrent 12/12/2011  . Reflux 12/12/2011  . LUQ pain 12/12/2011  . Hyperlipidemia 04/27/2012  . ADD (attention deficit disorder) 04/27/2012  . Acute bronchitis 11/19/2012  . Back pain 11/07/2013    Past Surgical History  Procedure Laterality Date  . Appendectomy  2007  . Cholecystectomy  2000  . Cesarean section  1-02 and 4-04    Family History  Problem Relation Age of Onset  . Thyroid disease Mother   . Hypertension Father   . Hyperlipidemia Father   . Heart disease Father   . Diabetes Father   . COPD Father     previous smoker  . Stroke Father   . Depression  Father   . Restless legs syndrome Father   . Diabetes Brother   . Cancer Maternal Grandmother     Head and Neck  . Heart disease Paternal Grandmother   . Hyperlipidemia Paternal Grandmother   . Hypertension Paternal Grandmother   . Diabetes Paternal Grandmother   . Depression Paternal Grandmother   . Stroke Paternal Grandmother   . Restless legs syndrome Paternal Grandmother   . Heart disease Paternal Grandfather   . Hyperlipidemia Paternal Grandfather   . Hypertension Paternal Grandfather   . Stroke Paternal Grandfather   . Diabetes Paternal Grandfather     History   Social History  . Marital Status: Married    Spouse Name: N/A    Number of Children: N/A  . Years of Education: N/A   Occupational History  . Not on file.   Social History Main Topics  . Smoking status: Never Smoker   . Smokeless tobacco: Never Used  . Alcohol Use: Yes     Comment: rarely  . Drug Use: No  . Sexual Activity: Yes    Partners: Male   Other Topics Concern  . Not on file   Social History Narrative  . No narrative on file    Current Outpatient Prescriptions on File Prior to Visit  Medication Sig Dispense Refill  . Acetaminophen (TYLENOL EXTRA STRENGTH PO) Take 2 tablets by mouth as needed.      Marland Kitchen  albuterol (PROVENTIL HFA;VENTOLIN HFA) 108 (90 BASE) MCG/ACT inhaler Inhale 2 puffs into the lungs every 4 (four) hours as needed for wheezing or shortness of breath.  18 g  5  . budesonide-formoterol (SYMBICORT) 160-4.5 MCG/ACT inhaler Inhale 2 puffs into the lungs 2 (two) times daily.  10.2 g  4  . Butalbital-APAP-Caffeine 50-300-40 MG CAPS Take 1 capsule by mouth 3 (three) times daily as needed. HA  84 capsule  1  . citalopram (CELEXA) 10 MG tablet Take 1 tablet (10 mg total) by mouth 2 (two) times daily.  60 tablet  3  . ibuprofen (ADVIL,MOTRIN) 200 MG tablet Take 200 mg by mouth every 6 (six) hours as needed.      . montelukast (SINGULAIR) 10 MG tablet TAKE 1 TABLET (10 MG TOTAL) BY MOUTH AT  BEDTIME.  30 tablet  3  . Multiple Vitamin (MULTIVITAMIN) tablet Take 1 tablet by mouth daily.      . mupirocin ointment (BACTROBAN) 2 % Place 1 application into the nose 2 (two) times daily. Via clean qtip x 7 days  22 g  1  . penciclovir (DENAVIR) 1 % cream Apply topically every 2 (two) hours.  1.5 g  1  . valACYclovir (VALTREX) 1000 MG tablet TAKE 1 TABLET BY MOUTH TWICE A DAY  60 tablet  1   No current facility-administered medications on file prior to visit.    Allergies  Allergen Reactions  . Actifed [Triprolidine-Pse] Hives    Review of Systems  Review of Systems  Constitutional: Positive for malaise/fatigue. Negative for fever.  HENT: Negative for congestion.   Eyes: Negative for discharge.  Respiratory: Negative for shortness of breath.   Cardiovascular: Negative for chest pain, palpitations and leg swelling.  Gastrointestinal: Negative for nausea, abdominal pain and diarrhea.  Genitourinary: Negative for dysuria.  Musculoskeletal: Negative for falls.  Skin: Negative for rash.  Neurological: Negative for loss of consciousness and headaches.  Endo/Heme/Allergies: Negative for polydipsia.  Psychiatric/Behavioral: Negative for depression and suicidal ideas. The patient is nervous/anxious. The patient does not have insomnia.     Objective  BP 108/72  Pulse 97  Temp(Src) 98.7 F (37.1 C) (Oral)  Ht 5\' 5"  (1.651 m)  Wt 212 lb 1.9 oz (96.217 kg)  BMI 35.30 kg/m2  SpO2 97%  LMP 01/14/2014  Physical Exam  Physical Exam  Constitutional: She is oriented to person, place, and time and well-developed, well-nourished, and in no distress. No distress.  HENT:  Head: Normocephalic and atraumatic.  Eyes: Conjunctivae are normal.  Neck: Neck supple. No thyromegaly present.  Cardiovascular: Normal rate, regular rhythm and normal heart sounds.   No murmur heard. Pulmonary/Chest: Effort normal and breath sounds normal. She has no wheezes.  Abdominal: She exhibits no  distension and no mass.  Musculoskeletal: She exhibits no edema.  Lymphadenopathy:    She has no cervical adenopathy.  Neurological: She is alert and oriented to person, place, and time.  Skin: Skin is warm and dry. No rash noted. She is not diaphoretic.  Psychiatric: Memory, affect and judgment normal.    Lab Results  Component Value Date   TSH 1.410 05/14/2013   Lab Results  Component Value Date   WBC 6.0 05/14/2013   HGB 14.3 05/14/2013   HCT 41.0 05/14/2013   MCV 88.7 05/14/2013   PLT 295 05/14/2013   Lab Results  Component Value Date   CREATININE 0.79 05/14/2013   BUN 12 05/14/2013   NA 139 05/14/2013   K 4.5 05/14/2013  CL 103 05/14/2013   CO2 28 05/14/2013   Lab Results  Component Value Date   ALT 17 05/14/2013   AST 18 05/14/2013   ALKPHOS 63 05/14/2013   BILITOT 0.6 05/14/2013   Lab Results  Component Value Date   CHOL 205* 05/14/2013   Lab Results  Component Value Date   HDL 52 05/14/2013   Lab Results  Component Value Date   LDLCALC 133* 05/14/2013   Lab Results  Component Value Date   TRIG 98 05/14/2013   Lab Results  Component Value Date   CHOLHDL 3.9 05/14/2013     Assessment & Plan  Asthma Well controlled at the present time but the fall is when she often has flare ups. Discussed strategies for managing. Patient very good at adjusting meds as needed  ADD (attention deficit disorder) Tolerating Vyvanse, given refill on this today  Depression with anxiety Patient continues to manage well despite numerous stressors including children, ill husband and father, new job as Visual merchandiser and being in school will call if any new concerns

## 2014-02-09 NOTE — Assessment & Plan Note (Signed)
Well controlled at the present time but the fall is when she often has flare ups. Discussed strategies for managing. Patient very good at adjusting meds as needed

## 2014-02-09 NOTE — Assessment & Plan Note (Signed)
Tolerating Vyvanse, given refill on this today

## 2014-03-01 ENCOUNTER — Other Ambulatory Visit: Payer: Self-pay | Admitting: Family Medicine

## 2014-03-03 ENCOUNTER — Other Ambulatory Visit: Payer: Self-pay | Admitting: Family Medicine

## 2014-05-14 ENCOUNTER — Telehealth: Payer: Self-pay

## 2014-05-14 DIAGNOSIS — F988 Other specified behavioral and emotional disorders with onset usually occurring in childhood and adolescence: Secondary | ICD-10-CM

## 2014-05-14 MED ORDER — AMPHETAMINE-DEXTROAMPHETAMINE 10 MG PO TABS: 10.0000 mg | ORAL_TABLET | ORAL | Status: DC

## 2014-05-14 NOTE — Telephone Encounter (Signed)
Adderall Rx printed, placed for sig.

## 2014-05-14 NOTE — Telephone Encounter (Signed)
OK to write her Vyvanse refill for a 30 day supply and I will sign when back in.

## 2014-05-14 NOTE — Telephone Encounter (Signed)
Was received via spouses MyChart:  Kristina Watts is out of her [employer], and does not have her follow-up appointment till December 27th(-ish). If you would please write her a 30 day script until she can get into get her check up. Patient has appt 12/22  Last OV: 02/04/2014 Last refill: 02/04/2014 #60 0 refills  Please advise

## 2014-05-19 ENCOUNTER — Other Ambulatory Visit: Payer: Self-pay | Admitting: Family Medicine

## 2014-05-19 MED ORDER — LISDEXAMFETAMINE DIMESYLATE 70 MG PO CAPS: 70.0000 mg | ORAL_CAPSULE | Freq: Every day | ORAL | Status: DC

## 2014-06-10 ENCOUNTER — Ambulatory Visit (INDEPENDENT_AMBULATORY_CARE_PROVIDER_SITE_OTHER): Payer: Managed Care, Other (non HMO) | Admitting: Family Medicine

## 2014-06-10 ENCOUNTER — Encounter: Payer: Self-pay | Admitting: Family Medicine

## 2014-06-10 VITALS — BP 128/84 | HR 72 | Temp 98.5°F | Ht 65.0 in | Wt 206.4 lb

## 2014-06-10 DIAGNOSIS — IMO0001 Reserved for inherently not codable concepts without codable children: Secondary | ICD-10-CM

## 2014-06-10 DIAGNOSIS — E663 Overweight: Secondary | ICD-10-CM

## 2014-06-10 DIAGNOSIS — F988 Other specified behavioral and emotional disorders with onset usually occurring in childhood and adolescence: Secondary | ICD-10-CM

## 2014-06-10 DIAGNOSIS — K219 Gastro-esophageal reflux disease without esophagitis: Secondary | ICD-10-CM

## 2014-06-10 DIAGNOSIS — Z0000 Encounter for general adult medical examination without abnormal findings: Secondary | ICD-10-CM

## 2014-06-10 DIAGNOSIS — E785 Hyperlipidemia, unspecified: Secondary | ICD-10-CM

## 2014-06-10 DIAGNOSIS — F909 Attention-deficit hyperactivity disorder, unspecified type: Secondary | ICD-10-CM

## 2014-06-10 DIAGNOSIS — F418 Other specified anxiety disorders: Secondary | ICD-10-CM

## 2014-06-10 DIAGNOSIS — T7840XD Allergy, unspecified, subsequent encounter: Secondary | ICD-10-CM

## 2014-06-10 LAB — LIPID PANEL
Cholesterol: 181 mg/dL (ref 0–200)
HDL: 42.8 mg/dL (ref >39.00)
LDL Cholesterol: 126 mg/dL — ABNORMAL HIGH (ref 0–99)
NonHDL: 138.2
TRIGLYCERIDES: 60 mg/dL (ref 0.0–149.0)
Total CHOL/HDL Ratio: 4
VLDL: 12 mg/dL (ref 0.0–40.0)

## 2014-06-10 LAB — HEPATIC FUNCTION PANEL
ALT: 23 U/L (ref 0–35)
AST: 20 U/L (ref 0–37)
Albumin: 4 g/dL (ref 3.5–5.2)
Alkaline Phosphatase: 59 U/L (ref 39–117)
Bilirubin, Direct: 0.1 mg/dL (ref 0.0–0.3)
TOTAL PROTEIN: 6.9 g/dL (ref 6.0–8.3)
Total Bilirubin: 0.7 mg/dL (ref 0.2–1.2)

## 2014-06-10 LAB — RENAL FUNCTION PANEL
Albumin: 4 g/dL (ref 3.5–5.2)
BUN: 10 mg/dL (ref 6–23)
CO2: 25 mEq/L (ref 19–32)
Calcium: 8.6 mg/dL (ref 8.4–10.5)
Chloride: 106 mEq/L (ref 96–112)
Creatinine, Ser: 0.6 mg/dL (ref 0.4–1.2)
GFR: 119.25 mL/min (ref >60.00)
GLUCOSE: 100 mg/dL — AB (ref 70–99)
Phosphorus: 2.4 mg/dL (ref 2.3–4.6)
Potassium: 4 mEq/L (ref 3.5–5.1)
SODIUM: 137 meq/L (ref 135–145)

## 2014-06-10 LAB — CBC
HCT: 39.2 % (ref 36.0–46.0)
Hemoglobin: 13.3 g/dL (ref 12.0–15.0)
MCHC: 34 g/dL (ref 30.0–36.0)
MCV: 89.1 fl (ref 78.0–100.0)
PLATELETS: 285 10*3/uL (ref 150.0–400.0)
RBC: 4.39 Mil/uL (ref 3.87–5.11)
RDW: 12.4 % (ref 11.5–15.5)
WBC: 6.3 10*3/uL (ref 4.0–10.5)

## 2014-06-10 LAB — TSH: TSH: 1.07 u[IU]/mL (ref 0.35–4.50)

## 2014-06-10 MED ORDER — AMPHETAMINE-DEXTROAMPHETAMINE 10 MG PO TABS
10.0000 mg | ORAL_TABLET | ORAL | Status: DC
Start: 2014-06-10 — End: 2014-09-23

## 2014-06-10 MED ORDER — AMPHETAMINE-DEXTROAMPHETAMINE 10 MG PO TABS: 10.0000 mg | ORAL_TABLET | ORAL | Status: DC

## 2014-06-10 NOTE — Assessment & Plan Note (Signed)
Encouraged DASH diet, decrease po intake and increase exercise as tolerated. Needs 7-8 hours of sleep nightly. Avoid trans fats, eat small, frequent meals every 4-5 hours with lean proteins, complex carbs and healthy fats. Minimize simple carbs 

## 2014-06-10 NOTE — Assessment & Plan Note (Signed)
No recent flares 

## 2014-06-10 NOTE — Assessment & Plan Note (Signed)
Avoid offending foods, start probiotics. Do not eat large meals in late evening and consider raising head of bed.  

## 2014-06-10 NOTE — Assessment & Plan Note (Signed)
Encouraged heart healthy diet, increase exercise, avoid trans fats, consider a krill oil cap daily. Check lipids today in preparation for her annual exam next week.

## 2014-06-10 NOTE — Progress Notes (Signed)
Kristina Watts  [id number] 06-18-1971 06/10/2014      Progress Note-Follow Up  Subjective  Chief Complaint  Chief Complaint  Patient presents with  . Medcheck    HPI  Patient is a 43 y.o. female in today for routine medical care. Patient in today for follow-up on medications. Doing well on meds. They still help her concentration and her fatigue. No recent illness. Struggling with her depression. Her husbands MS is worsening and he has been out of work status post her surgery. As a result his depression is also worsening. She stopped her antidepressant for 1 month but has restarted it does feel that helping. No suicidal ideation but anhedonia is noted. Denies CP/palp/SOB/HA/congestion/fevers/GI or GU c/o. Taking meds as prescribed  Past Medical History  Diagnosis Date  . Chicken pox as a child  . Pneumonia 1998  . Allergy     seasonal  . Asthma   . Depression   . Recurrent cold sores   . Depression with anxiety 04/18/2011  . 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 Overweight(278.02) 04/19/2011  . GDM (gestational diabetes mellitus) 04/19/2011  . History of pancreatitis 04/19/2011  . Allergic state 04/19/2011  . Preventative health care 04/19/2011  . Asthma 04/19/2011  . Tinea corporis 05/18/2011  . Fatigue 05/30/2011  . Otitis media of right ear 05/30/2011  . Dermatitis 05/18/2011  . Pharyngitis 08/23/2011  . Migraine 08/23/2011  . Pancreatitis, recurrent 12/12/2011  . Reflux 12/12/2011  . LUQ pain 12/12/2011  . Hyperlipidemia 04/27/2012  . ADD (attention deficit disorder) 04/27/2012  . Acute bronchitis 11/19/2012  . Back pain 11/07/2013    Past Surgical History  Procedure Laterality Date  . Appendectomy  2007  . Cholecystectomy  2000  . Cesarean section  1-02 and 4-04    Family History  Problem Relation Age of Onset  . Thyroid disease Mother   . Hypertension Father   . Hyperlipidemia Father   . Heart disease Father   . Diabetes Father   . COPD  Father     previous smoker  . Stroke Father   . Depression Father   . Restless legs syndrome Father   . Diabetes Brother   . Cancer Maternal Grandmother     Head and Neck  . Heart disease Paternal Grandmother   . Hyperlipidemia Paternal Grandmother   . Hypertension Paternal Grandmother   . Diabetes Paternal Grandmother   . Depression Paternal Grandmother   . Stroke Paternal Grandmother   . Restless legs syndrome Paternal Grandmother   . Heart disease Paternal Grandfather   . Hyperlipidemia Paternal Grandfather   . Hypertension Paternal Grandfather   . Stroke Paternal Grandfather   . Diabetes Paternal Grandfather     History   Social History  . Marital Status: Married    Spouse Name: N/A    Number of Children: N/A  . Years of Education: N/A   Occupational History  . Not on file.   Social History Main Topics  . Smoking status: Never Smoker   . Smokeless tobacco: Never Used  . Alcohol Use: Yes     Comment: rarely  . Drug Use: No  . Sexual Activity:    Partners: Male   Other Topics Concern  . Not on file   Social History Narrative    Current Outpatient Prescriptions on File Prior to Visit  Medication Sig Dispense Refill  . Acetaminophen (TYLENOL EXTRA STRENGTH PO) Take 2 tablets by mouth as needed.    Marland Kitchen  albuterol (PROVENTIL HFA;VENTOLIN HFA) 108 (90 BASE) MCG/ACT inhaler Inhale 2 puffs into the lungs every 4 (four) hours as needed for wheezing or shortness of breath. 18 g 5  . amphetamine-dextroamphetamine (ADDERALL) 10 MG tablet Take 1 tablet (10 mg total) by mouth as directed. 30 tablet 0  . budesonide-formoterol (SYMBICORT) 160-4.5 MCG/ACT inhaler Inhale 2 puffs into the lungs 2 (two) times daily. 10.2 g 4  . Butalbital-APAP-Caffeine 50-300-40 MG CAPS Take 1 capsule by mouth 3 (three) times daily as needed. HA 84 capsule 1  . citalopram (CELEXA) 10 MG tablet TAKE 1 TABLET (10 MG TOTAL) BY MOUTH 2 (TWO) TIMES DAILY. 60 tablet 3  . ibuprofen (ADVIL,MOTRIN) 200 MG  tablet Take 200 mg by mouth every 6 (six) hours as needed.    Marland Kitchen lisdexamfetamine (VYVANSE) 70 MG capsule Take 1 capsule (70 mg total) by mouth daily. February 04 2014 RX 30 capsule 0  . lisdexamfetamine (VYVANSE) 70 MG capsule Take 1 capsule (70 mg total) by mouth daily. Sept 18, 15 RX 30 capsule 0  . lisdexamfetamine (VYVANSE) 70 MG capsule Take 1 capsule (70 mg total) by mouth daily. November 2015 rx 30 capsule 0  . montelukast (SINGULAIR) 10 MG tablet TAKE 1 TABLET (10 MG TOTAL) BY MOUTH AT BEDTIME. 30 tablet 3  . Multiple Vitamin (MULTIVITAMIN) tablet Take 1 tablet by mouth daily.    . mupirocin ointment (BACTROBAN) 2 % Place 1 application into the nose 2 (two) times daily. Via clean qtip x 7 days 22 g 1  . penciclovir (DENAVIR) 1 % cream Apply topically every 2 (two) hours. 1.5 g 1  . valACYclovir (VALTREX) 1000 MG tablet TAKE 1 TABLET BY MOUTH TWICE A DAY 60 tablet 1   No current facility-administered medications on file prior to visit.    Allergies  Allergen Reactions  . Actifed [Triprolidine-Pse] Hives    Review of Systems  Review of Systems  Constitutional: Positive for malaise/fatigue. Negative for fever.  HENT: Negative for congestion.   Eyes: Negative for discharge.  Respiratory: Negative for shortness of breath.   Cardiovascular: Negative for chest pain, palpitations and leg swelling.  Gastrointestinal: Negative for nausea, abdominal pain and diarrhea.  Genitourinary: Negative for dysuria.  Musculoskeletal: Negative for falls.  Skin: Negative for rash.  Neurological: Negative for loss of consciousness and headaches.  Endo/Heme/Allergies: Negative for polydipsia.  Psychiatric/Behavioral: Positive for depression. Negative for suicidal ideas. The patient is nervous/anxious. The patient does not have insomnia.     Objective  BP 128/84 mmHg  Pulse 72  Temp(Src) 98.5 F (36.9 C) (Oral)  Ht 5\' 5"  (1.651 m)  Wt 206 lb 6.4 oz (93.622 kg)  BMI 34.35 kg/m2  SpO2 97%  LMP  05/27/2014  Physical Exam  Physical Exam  Constitutional: She is oriented to person, place, and time and well-developed, well-nourished, and in no distress. No distress.  HENT:  Head: Normocephalic and atraumatic.  Eyes: Conjunctivae are normal.  Neck: Neck supple. No thyromegaly present.  Cardiovascular: Normal rate, regular rhythm and normal heart sounds.   No murmur heard. Pulmonary/Chest: Effort normal and breath sounds normal. She has no wheezes.  Abdominal: She exhibits no distension and no mass.  Musculoskeletal: She exhibits no edema.  Lymphadenopathy:    She has no cervical adenopathy.  Neurological: She is alert and oriented to person, place, and time.  Skin: Skin is warm and dry. No rash noted. She is not diaphoretic.  Psychiatric: Memory, affect and judgment normal.    Lab Results  Component Value Date   TSH 1.410 05/14/2013   Lab Results  Component Value Date   WBC 6.0 05/14/2013   HGB 14.3 05/14/2013   HCT 41.0 05/14/2013   MCV 88.7 05/14/2013   PLT 295 05/14/2013   Lab Results  Component Value Date   CREATININE 0.79 05/14/2013   BUN 12 05/14/2013   NA 139 05/14/2013   K 4.5 05/14/2013   CL 103 05/14/2013   CO2 28 05/14/2013   Lab Results  Component Value Date   ALT 17 05/14/2013   AST 18 05/14/2013   ALKPHOS 63 05/14/2013   BILITOT 0.6 05/14/2013   Lab Results  Component Value Date   CHOL 205* 05/14/2013   Lab Results  Component Value Date   HDL 52 05/14/2013   Lab Results  Component Value Date   LDLCALC 133* 05/14/2013   Lab Results  Component Value Date   TRIG 98 05/14/2013   Lab Results  Component Value Date   CHOLHDL 3.9 05/14/2013     Assessment & Plan  ADD (attention deficit disorder) Doing well on current meds, allowed refills today  Reflux Avoid offending foods, start probiotics. Do not eat large meals in late evening and consider raising head of bed.   Overweight Encouraged DASH diet, decrease po intake and  increase exercise as tolerated. Needs 7-8 hours of sleep nightly. Avoid trans fats, eat small, frequent meals every 4-5 hours with lean proteins, complex carbs and healthy fats. Minimize simple carbs  Depression with anxiety Struggling with husband's worsening illness, he is out of work right now and struggling with depression and poor mobility of his own. She stopped her Antidepressant for a month and got worse. She is back on it now and doing better. Her job continues to be stressful also  Hyperlipidemia Encouraged heart healthy diet, increase exercise, avoid trans fats, consider a krill oil cap daily. Check lipids today in preparation for her annual exam next week.  Allergic state No recent flares

## 2014-06-10 NOTE — Assessment & Plan Note (Signed)
Struggling with husband's worsening illness, he is out of work right now and struggling with depression and poor mobility of his own. She stopped her Antidepressant for a month and got worse. She is back on it now and doing better. Her job continues to be stressful also

## 2014-06-10 NOTE — Progress Notes (Signed)
Pre visit review using our clinic review tool, if applicable. No additional management support is needed unless otherwise documented below in the visit note. 

## 2014-06-10 NOTE — Assessment & Plan Note (Signed)
Doing well on current meds, allowed refills today

## 2014-06-16 ENCOUNTER — Other Ambulatory Visit: Payer: Self-pay | Admitting: Family Medicine

## 2014-06-16 MED ORDER — LISDEXAMFETAMINE DIMESYLATE 70 MG PO CAPS: 70.0000 mg | ORAL_CAPSULE | Freq: Every day | ORAL | Status: DC

## 2014-06-16 NOTE — Telephone Encounter (Signed)
Rx printed and signed and placed up front for the pt to pick up.//AB/CMA

## 2014-06-16 NOTE — Telephone Encounter (Signed)
Requesting Vyvanse 70mg -Take 1 capsule by mouth daily. Last refill:05/19/14;#30,0 Last OV:06/10/14 Please advise.//AB/CMA

## 2014-07-18 ENCOUNTER — Encounter: Payer: Managed Care, Other (non HMO) | Admitting: Family Medicine

## 2014-08-08 ENCOUNTER — Other Ambulatory Visit: Payer: Self-pay | Admitting: Family Medicine

## 2014-08-11 ENCOUNTER — Other Ambulatory Visit: Payer: Self-pay | Admitting: Family Medicine

## 2014-08-11 DIAGNOSIS — F988 Other specified behavioral and emotional disorders with onset usually occurring in childhood and adolescence: Secondary | ICD-10-CM

## 2014-08-11 MED ORDER — LISDEXAMFETAMINE DIMESYLATE 70 MG PO CAPS: 70.0000 mg | ORAL_CAPSULE | Freq: Every day | ORAL | Status: DC

## 2014-08-11 NOTE — Telephone Encounter (Signed)
Printed Vyvanse hardcopy's fill dates 08/11/14, 09/10/14 and 10/10/14 per PCP instructions from mychart refill request by the patient.  Put on counter for PCP to sign.

## 2014-09-04 ENCOUNTER — Telehealth: Payer: Self-pay | Admitting: Family Medicine

## 2014-09-04 NOTE — Telephone Encounter (Signed)
Pre visit CPE letter sent

## 2014-09-22 ENCOUNTER — Other Ambulatory Visit: Payer: Self-pay

## 2014-09-23 ENCOUNTER — Encounter: Payer: Self-pay | Admitting: Family Medicine

## 2014-09-23 ENCOUNTER — Telehealth: Payer: Self-pay

## 2014-09-23 ENCOUNTER — Ambulatory Visit (INDEPENDENT_AMBULATORY_CARE_PROVIDER_SITE_OTHER): Payer: Managed Care, Other (non HMO) | Admitting: Family Medicine

## 2014-09-23 VITALS — BP 108/74 | HR 86 | Temp 98.0°F | Resp 16 | Ht 64.75 in | Wt 211.0 lb

## 2014-09-23 DIAGNOSIS — F909 Attention-deficit hyperactivity disorder, unspecified type: Secondary | ICD-10-CM

## 2014-09-23 DIAGNOSIS — M549 Dorsalgia, unspecified: Secondary | ICD-10-CM

## 2014-09-23 DIAGNOSIS — F988 Other specified behavioral and emotional disorders with onset usually occurring in childhood and adolescence: Secondary | ICD-10-CM

## 2014-09-23 DIAGNOSIS — Z0000 Encounter for general adult medical examination without abnormal findings: Secondary | ICD-10-CM | POA: Diagnosis not present

## 2014-09-23 DIAGNOSIS — E785 Hyperlipidemia, unspecified: Secondary | ICD-10-CM

## 2014-09-23 DIAGNOSIS — L578 Other skin changes due to chronic exposure to nonionizing radiation: Secondary | ICD-10-CM

## 2014-09-23 DIAGNOSIS — K219 Gastro-esophageal reflux disease without esophagitis: Secondary | ICD-10-CM

## 2014-09-23 DIAGNOSIS — E782 Mixed hyperlipidemia: Secondary | ICD-10-CM | POA: Diagnosis not present

## 2014-09-23 DIAGNOSIS — IMO0001 Reserved for inherently not codable concepts without codable children: Secondary | ICD-10-CM

## 2014-09-23 MED ORDER — AMPHETAMINE-DEXTROAMPHETAMINE 10 MG PO TABS: 10.0000 mg | ORAL_TABLET | ORAL | Status: DC

## 2014-09-23 MED ORDER — LISDEXAMFETAMINE DIMESYLATE 70 MG PO CAPS: 70.0000 mg | ORAL_CAPSULE | Freq: Every day | ORAL | Status: DC

## 2014-09-23 NOTE — Telephone Encounter (Signed)
Pt came into office today for OV, insurance forms given to Dr. Charlett Blake for completion. Forms completed, signed and mailed back to Pt as requested. Copies of forms sent for scanning into chart.

## 2014-09-23 NOTE — Patient Instructions (Addendum)
Preventive Care for Adults A healthy lifestyle and preventive care can promote health and wellness. Preventive health guidelines for women include the following key practices.  A routine yearly physical is a good way to check with your health care provider about your health and preventive screening. It is a chance to share any concerns and updates on your health and to receive a thorough exam.  Visit your dentist for a routine exam and preventive care every 6 months. Brush your teeth twice a day and floss once a day. Good oral hygiene prevents tooth decay and gum disease.  The frequency of eye exams is based on your age, health, family medical history, use of contact lenses, and other factors. Follow your health care provider's recommendations for frequency of eye exams.  Eat a healthy diet. Foods like vegetables, fruits, whole grains, low-fat dairy products, and lean protein foods contain the nutrients you need without too many calories. Decrease your intake of foods high in solid fats, added sugars, and salt. Eat the right amount of calories for you.Get information about a proper diet from your health care provider, if necessary.  Regular physical exercise is one of the most important things you can do for your health. Most adults should get at least 150 minutes of moderate-intensity exercise (any activity that increases your heart rate and causes you to sweat) each week. In addition, most adults need muscle-strengthening exercises on 2 or more days a week.  Maintain a healthy weight. The body mass index (BMI) is a screening tool to identify possible weight problems. It provides an estimate of body fat based on height and weight. Your health care provider can find your BMI and can help you achieve or maintain a healthy weight.For adults 20 years and older:  A BMI below 18.5 is considered underweight.  A BMI of 18.5 to 24.9 is normal.  A BMI of 25 to 29.9 is considered overweight.  A BMI of  30 and above is considered obese.  Maintain normal blood lipids and cholesterol levels by exercising and minimizing your intake of saturated fat. Eat a balanced diet with plenty of fruit and vegetables. Blood tests for lipids and cholesterol should begin at age 76 and be repeated every 5 years. If your lipid or cholesterol levels are high, you are over 50, or you are at high risk for heart disease, you may need your cholesterol levels checked more frequently.Ongoing high lipid and cholesterol levels should be treated with medicines if diet and exercise are not working.  If you smoke, find out from your health care provider how to quit. If you do not use tobacco, do not start.  Lung cancer screening is recommended for adults aged 22-80 years who are at high risk for developing lung cancer because of a history of smoking. A yearly low-dose CT scan of the lungs is recommended for people who have at least a 30-pack-year history of smoking and are a current smoker or have quit within the past 15 years. A pack year of smoking is smoking an average of 1 pack of cigarettes a day for 1 year (for example: 1 pack a day for 30 years or 2 packs a day for 15 years). Yearly screening should continue until the smoker has stopped smoking for at least 15 years. Yearly screening should be stopped for people who develop a health problem that would prevent them from having lung cancer treatment.  If you are pregnant, do not drink alcohol. If you are breastfeeding,  be very cautious about drinking alcohol. If you are not pregnant and choose to drink alcohol, do not have more than 1 drink per day. One drink is considered to be 12 ounces (355 mL) of beer, 5 ounces (148 mL) of wine, or 1.5 ounces (44 mL) of liquor.  Avoid use of street drugs. Do not share needles with anyone. Ask for help if you need support or instructions about stopping the use of drugs.  High blood pressure causes heart disease and increases the risk of  stroke. Your blood pressure should be checked at least every 1 to 2 years. Ongoing high blood pressure should be treated with medicines if weight loss and exercise do not work.  If you are 75-52 years old, ask your health care provider if you should take aspirin to prevent strokes.  Diabetes screening involves taking a blood sample to check your fasting blood sugar level. This should be done once every 3 years, after age 15, if you are within normal weight and without risk factors for diabetes. Testing should be considered at a younger age or be carried out more frequently if you are overweight and have at least 1 risk factor for diabetes.  Breast cancer screening is essential preventive care for women. You should practice "breast self-awareness." This means understanding the normal appearance and feel of your breasts and may include breast self-examination. Any changes detected, no matter how small, should be reported to a health care provider. Women in their 58s and 30s should have a clinical breast exam (CBE) by a health care provider as part of a regular health exam every 1 to 3 years. After age 16, women should have a CBE every year. Starting at age 53, women should consider having a mammogram (breast X-ray test) every year. Women who have a family history of breast cancer should talk to their health care provider about genetic screening. Women at a high risk of breast cancer should talk to their health care providers about having an MRI and a mammogram every year.  Breast cancer gene (BRCA)-related cancer risk assessment is recommended for women who have family members with BRCA-related cancers. BRCA-related cancers include breast, ovarian, tubal, and peritoneal cancers. Having family members with these cancers may be associated with an increased risk for harmful changes (mutations) in the breast cancer genes BRCA1 and BRCA2. Results of the assessment will determine the need for genetic counseling and  BRCA1 and BRCA2 testing.  Routine pelvic exams to screen for cancer are no longer recommended for nonpregnant women who are considered low risk for cancer of the pelvic organs (ovaries, uterus, and vagina) and who do not have symptoms. Ask your health care provider if a screening pelvic exam is right for you.  If you have had past treatment for cervical cancer or a condition that could lead to cancer, you need Pap tests and screening for cancer for at least 20 years after your treatment. If Pap tests have been discontinued, your risk factors (such as having a new sexual partner) need to be reassessed to determine if screening should be resumed. Some women have medical problems that increase the chance of getting cervical cancer. In these cases, your health care provider may recommend more frequent screening and Pap tests.  The HPV test is an additional test that may be used for cervical cancer screening. The HPV test looks for the virus that can cause the cell changes on the cervix. The cells collected during the Pap test can be  tested for HPV. The HPV test could be used to screen women aged 30 years and older, and should be used in women of any age who have unclear Pap test results. After the age of 30, women should have HPV testing at the same frequency as a Pap test.  Colorectal cancer can be detected and often prevented. Most routine colorectal cancer screening begins at the age of 50 years and continues through age 75 years. However, your health care provider may recommend screening at an earlier age if you have risk factors for colon cancer. On a yearly basis, your health care provider may provide home test kits to check for hidden blood in the stool. Use of a small camera at the end of a tube, to directly examine the colon (sigmoidoscopy or colonoscopy), can detect the earliest forms of colorectal cancer. Talk to your health care provider about this at age 50, when routine screening begins. Direct  exam of the colon should be repeated every 5-10 years through age 75 years, unless early forms of pre-cancerous polyps or small growths are found.  People who are at an increased risk for hepatitis B should be screened for this virus. You are considered at high risk for hepatitis B if:  You were born in a country where hepatitis B occurs often. Talk with your health care provider about which countries are considered high risk.  Your parents were born in a high-risk country and you have not received a shot to protect against hepatitis B (hepatitis B vaccine).  You have HIV or AIDS.  You use needles to inject street drugs.  You live with, or have sex with, someone who has hepatitis B.  You get hemodialysis treatment.  You take certain medicines for conditions like cancer, organ transplantation, and autoimmune conditions.  Hepatitis C blood testing is recommended for all people born from 1945 through 1965 and any individual with known risks for hepatitis C.  Practice safe sex. Use condoms and avoid high-risk sexual practices to reduce the spread of sexually transmitted infections (STIs). STIs include gonorrhea, chlamydia, syphilis, trichomonas, herpes, HPV, and human immunodeficiency virus (HIV). Herpes, HIV, and HPV are viral illnesses that have no cure. They can result in disability, cancer, and death.  You should be screened for sexually transmitted illnesses (STIs) including gonorrhea and chlamydia if:  You are sexually active and are younger than 24 years.  You are older than 24 years and your health care provider tells you that you are at risk for this type of infection.  Your sexual activity has changed since you were last screened and you are at an increased risk for chlamydia or gonorrhea. Ask your health care provider if you are at risk.  If you are at risk of being infected with HIV, it is recommended that you take a prescription medicine daily to prevent HIV infection. This is  called preexposure prophylaxis (PrEP). You are considered at risk if:  You are a heterosexual woman, are sexually active, and are at increased risk for HIV infection.  You take drugs by injection.  You are sexually active with a partner who has HIV.  Talk with your health care provider about whether you are at high risk of being infected with HIV. If you choose to begin PrEP, you should first be tested for HIV. You should then be tested every 3 months for as long as you are taking PrEP.  Osteoporosis is a disease in which the bones lose minerals and strength   with aging. This can result in serious bone fractures or breaks. The risk of osteoporosis can be identified using a bone density scan. Women ages 65 years and over and women at risk for fractures or osteoporosis should discuss screening with their health care providers. Ask your health care provider whether you should take a calcium supplement or vitamin D to reduce the rate of osteoporosis.  Menopause can be associated with physical symptoms and risks. Hormone replacement therapy is available to decrease symptoms and risks. You should talk to your health care provider about whether hormone replacement therapy is right for you.  Use sunscreen. Apply sunscreen liberally and repeatedly throughout the day. You should seek shade when your shadow is shorter than you. Protect yourself by wearing long sleeves, pants, a wide-brimmed hat, and sunglasses year round, whenever you are outdoors.  Once a month, do a whole body skin exam, using a mirror to look at the skin on your back. Tell your health care provider of new moles, moles that have irregular borders, moles that are larger than a pencil eraser, or moles that have changed in shape or color.  Stay current with required vaccines (immunizations).  Influenza vaccine. All adults should be immunized every year.  Tetanus, diphtheria, and acellular pertussis (Td, Tdap) vaccine. Pregnant women should  receive 1 dose of Tdap vaccine during each pregnancy. The dose should be obtained regardless of the length of time since the last dose. Immunization is preferred during the 27th-36th week of gestation. An adult who has not previously received Tdap or who does not know her vaccine status should receive 1 dose of Tdap. This initial dose should be followed by tetanus and diphtheria toxoids (Td) booster doses every 10 years. Adults with an unknown or incomplete history of completing a 3-dose immunization series with Td-containing vaccines should begin or complete a primary immunization series including a Tdap dose. Adults should receive a Td booster every 10 years.  Varicella vaccine. An adult without evidence of immunity to varicella should receive 2 doses or a second dose if she has previously received 1 dose. Pregnant females who do not have evidence of immunity should receive the first dose after pregnancy. This first dose should be obtained before leaving the health care facility. The second dose should be obtained 4-8 weeks after the first dose.  Human papillomavirus (HPV) vaccine. Females aged 13-26 years who have not received the vaccine previously should obtain the 3-dose series. The vaccine is not recommended for use in pregnant females. However, pregnancy testing is not needed before receiving a dose. If a female is found to be pregnant after receiving a dose, no treatment is needed. In that case, the remaining doses should be delayed until after the pregnancy. Immunization is recommended for any person with an immunocompromised condition through the age of 26 years if she did not get any or all doses earlier. During the 3-dose series, the second dose should be obtained 4-8 weeks after the first dose. The third dose should be obtained 24 weeks after the first dose and 16 weeks after the second dose.  Zoster vaccine. One dose is recommended for adults aged 60 years or older unless certain conditions are  present.  Measles, mumps, and rubella (MMR) vaccine. Adults born before 1957 generally are considered immune to measles and mumps. Adults born in 1957 or later should have 1 or more doses of MMR vaccine unless there is a contraindication to the vaccine or there is laboratory evidence of immunity to   each of the three diseases. A routine second dose of MMR vaccine should be obtained at least 28 days after the first dose for students attending postsecondary schools, health care workers, or international travelers. People who received inactivated measles vaccine or an unknown type of measles vaccine during 1963-1967 should receive 2 doses of MMR vaccine. People who received inactivated mumps vaccine or an unknown type of mumps vaccine before 1979 and are at high risk for mumps infection should consider immunization with 2 doses of MMR vaccine. For females of childbearing age, rubella immunity should be determined. If there is no evidence of immunity, females who are not pregnant should be vaccinated. If there is no evidence of immunity, females who are pregnant should delay immunization until after pregnancy. Unvaccinated health care workers born before 1957 who lack laboratory evidence of measles, mumps, or rubella immunity or laboratory confirmation of disease should consider measles and mumps immunization with 2 doses of MMR vaccine or rubella immunization with 1 dose of MMR vaccine.  Pneumococcal 13-valent conjugate (PCV13) vaccine. When indicated, a person who is uncertain of her immunization history and has no record of immunization should receive the PCV13 vaccine. An adult aged 19 years or older who has certain medical conditions and has not been previously immunized should receive 1 dose of PCV13 vaccine. This PCV13 should be followed with a dose of pneumococcal polysaccharide (PPSV23) vaccine. The PPSV23 vaccine dose should be obtained at least 8 weeks after the dose of PCV13 vaccine. An adult aged 19  years or older who has certain medical conditions and previously received 1 or more doses of PPSV23 vaccine should receive 1 dose of PCV13. The PCV13 vaccine dose should be obtained 1 or more years after the last PPSV23 vaccine dose.  Pneumococcal polysaccharide (PPSV23) vaccine. When PCV13 is also indicated, PCV13 should be obtained first. All adults aged 65 years and older should be immunized. An adult younger than age 65 years who has certain medical conditions should be immunized. Any person who resides in a nursing home or long-term care facility should be immunized. An adult smoker should be immunized. People with an immunocompromised condition and certain other conditions should receive both PCV13 and PPSV23 vaccines. People with human immunodeficiency virus (HIV) infection should be immunized as soon as possible after diagnosis. Immunization during chemotherapy or radiation therapy should be avoided. Routine use of PPSV23 vaccine is not recommended for American Indians, [place] Natives, or people younger than 65 years unless there are medical conditions that require PPSV23 vaccine. When indicated, people who have unknown immunization and have no record of immunization should receive PPSV23 vaccine. One-time revaccination 5 years after the first dose of PPSV23 is recommended for people aged 19-64 years who have chronic kidney failure, nephrotic syndrome, asplenia, or immunocompromised conditions. People who received 1-2 doses of PPSV23 before age 65 years should receive another dose of PPSV23 vaccine at age 65 years or later if at least 5 years have passed since the previous dose. Doses of PPSV23 are not needed for people immunized with PPSV23 at or after age 65 years.  Meningococcal vaccine. Adults with asplenia or persistent complement component deficiencies should receive 2 doses of quadrivalent meningococcal conjugate (MenACWY-D) vaccine. The doses should be obtained at least 2 months apart.  Microbiologists working with certain meningococcal bacteria, military recruits, people at risk during an outbreak, and people who travel to or live in countries with a high rate of meningitis should be immunized. A first-year college student up through age   21 years who is living in a residence hall should receive a dose if she did not receive a dose on or after her 16th birthday. Adults who have certain high-risk conditions should receive one or more doses of vaccine.  Hepatitis A vaccine. Adults who wish to be protected from this disease, have certain high-risk conditions, work with hepatitis A-infected animals, work in hepatitis A research labs, or travel to or work in countries with a high rate of hepatitis A should be immunized. Adults who were previously unvaccinated and who anticipate close contact with an international adoptee during the first 60 days after arrival in the [place] from a country with a high rate of hepatitis A should be immunized.  Hepatitis B vaccine. Adults who wish to be protected from this disease, have certain high-risk conditions, may be exposed to blood or other infectious body fluids, are household contacts or sex partners of hepatitis B positive people, are clients or workers in certain care facilities, or travel to or work in countries with a high rate of hepatitis B should be immunized.  Haemophilus influenzae type b (Hib) vaccine. A previously unvaccinated person with asplenia or sickle cell disease or having a scheduled splenectomy should receive 1 dose of Hib vaccine. Regardless of previous immunization, a recipient of a hematopoietic stem cell transplant should receive a 3-dose series 6-12 months after her successful transplant. Hib vaccine is not recommended for adults with HIV infection. Preventive Services / Frequency Ages 64 to 68 years  Blood pressure check.** / Every 1 to 2 years.  Lipid and cholesterol check.** / Every 5 years beginning at age  22.  Clinical breast exam.** / Every 3 years for women in their 88s and 53s.  BRCA-related cancer risk assessment.** / For women who have family members with a BRCA-related cancer (breast, ovarian, tubal, or peritoneal cancers).  Pap test.** / Every 2 years from ages 90 through 51. Every 3 years starting at age 21 through age 56 or 3 with a history of 3 consecutive normal Pap tests.  HPV screening.** / Every 3 years from ages 24 through ages 1 to 46 with a history of 3 consecutive normal Pap tests.  Hepatitis C blood test.** / For any individual with known risks for hepatitis C.  Skin self-exam. / Monthly.  Influenza vaccine. / Every year.  Tetanus, diphtheria, and acellular pertussis (Tdap, Td) vaccine.** / Consult your health care provider. Pregnant women should receive 1 dose of Tdap vaccine during each pregnancy. 1 dose of Td every 10 years.  Varicella vaccine.** / Consult your health care provider. Pregnant females who do not have evidence of immunity should receive the first dose after pregnancy.  HPV vaccine. / 3 doses over 6 months, if 72 and younger. The vaccine is not recommended for use in pregnant females. However, pregnancy testing is not needed before receiving a dose.  Measles, mumps, rubella (MMR) vaccine.** / You need at least 1 dose of MMR if you were born in 1957 or later. You may also need a 2nd dose. For females of childbearing age, rubella immunity should be determined. If there is no evidence of immunity, females who are not pregnant should be vaccinated. If there is no evidence of immunity, females who are pregnant should delay immunization until after pregnancy.  Pneumococcal 13-valent conjugate (PCV13) vaccine.** / Consult your health care provider.  Pneumococcal polysaccharide (PPSV23) vaccine.** / 1 to 2 doses if you smoke cigarettes or if you have certain conditions.  Meningococcal vaccine.** /  1 dose if you are age 19 to 21 years and a first-year college  student living in a residence hall, or have one of several medical conditions, you need to get vaccinated against meningococcal disease. You may also need additional booster doses.  Hepatitis A vaccine.** / Consult your health care provider.  Hepatitis B vaccine.** / Consult your health care provider.  Haemophilus influenzae type b (Hib) vaccine.** / Consult your health care provider. Ages 40 to 64 years  Blood pressure check.** / Every 1 to 2 years.  Lipid and cholesterol check.** / Every 5 years beginning at age 20 years.  Lung cancer screening. / Every year if you are aged 55-80 years and have a 30-pack-year history of smoking and currently smoke or have quit within the past 15 years. Yearly screening is stopped once you have quit smoking for at least 15 years or develop a health problem that would prevent you from having lung cancer treatment.  Clinical breast exam.** / Every year after age 40 years.  BRCA-related cancer risk assessment.** / For women who have family members with a BRCA-related cancer (breast, ovarian, tubal, or peritoneal cancers).  Mammogram.** / Every year beginning at age 40 years and continuing for as long as you are in good health. Consult with your health care provider.  Pap test.** / Every 3 years starting at age 30 years through age 65 or 70 years with a history of 3 consecutive normal Pap tests.  HPV screening.** / Every 3 years from ages 30 years through ages 65 to 70 years with a history of 3 consecutive normal Pap tests.  Fecal occult blood test (FOBT) of stool. / Every year beginning at age 50 years and continuing until age 75 years. You may not need to do this test if you get a colonoscopy every 10 years.  Flexible sigmoidoscopy or colonoscopy.** / Every 5 years for a flexible sigmoidoscopy or every 10 years for a colonoscopy beginning at age 50 years and continuing until age 75 years.  Hepatitis C blood test.** / For all people born from 1945 through  1965 and any individual with known risks for hepatitis C.  Skin self-exam. / Monthly.  Influenza vaccine. / Every year.  Tetanus, diphtheria, and acellular pertussis (Tdap/Td) vaccine.** / Consult your health care provider. Pregnant women should receive 1 dose of Tdap vaccine during each pregnancy. 1 dose of Td every 10 years.  Varicella vaccine.** / Consult your health care provider. Pregnant females who do not have evidence of immunity should receive the first dose after pregnancy.  Zoster vaccine.** / 1 dose for adults aged 60 years or older.  Measles, mumps, rubella (MMR) vaccine.** / You need at least 1 dose of MMR if you were born in 1957 or later. You may also need a 2nd dose. For females of childbearing age, rubella immunity should be determined. If there is no evidence of immunity, females who are not pregnant should be vaccinated. If there is no evidence of immunity, females who are pregnant should delay immunization until after pregnancy.  Pneumococcal 13-valent conjugate (PCV13) vaccine.** / Consult your health care provider.  Pneumococcal polysaccharide (PPSV23) vaccine.** / 1 to 2 doses if you smoke cigarettes or if you have certain conditions.  Meningococcal vaccine.** / Consult your health care provider.  Hepatitis A vaccine.** / Consult your health care provider.  Hepatitis B vaccine.** / Consult your health care provider.  Haemophilus influenzae type b (Hib) vaccine.** / Consult your health care provider. Ages 65   years and over  Blood pressure check.** / Every 1 to 2 years.  Lipid and cholesterol check.** / Every 5 years beginning at age 78 years.  Lung cancer screening. / Every year if you are aged 42-80 years and have a 30-pack-year history of smoking and currently smoke or have quit within the past 15 years. Yearly screening is stopped once you have quit smoking for at least 15 years or develop a health problem that would prevent you from having lung cancer  treatment.  Clinical breast exam.** / Every year after age 48 years.  BRCA-related cancer risk assessment.** / For women who have family members with a BRCA-related cancer (breast, ovarian, tubal, or peritoneal cancers).  Mammogram.** / Every year beginning at age 16 years and continuing for as long as you are in good health. Consult with your health care provider.  Pap test.** / Every 3 years starting at age 32 years through age 46 or 54 years with 3 consecutive normal Pap tests. Testing can be stopped between 65 and 70 years with 3 consecutive normal Pap tests and no abnormal Pap or HPV tests in the past 10 years.  HPV screening.** / Every 3 years from ages 59 years through ages 46 or 37 years with a history of 3 consecutive normal Pap tests. Testing can be stopped between 65 and 70 years with 3 consecutive normal Pap tests and no abnormal Pap or HPV tests in the past 10 years.  Fecal occult blood test (FOBT) of stool. / Every year beginning at age 22 years and continuing until age 18 years. You may not need to do this test if you get a colonoscopy every 10 years.  Flexible sigmoidoscopy or colonoscopy.** / Every 5 years for a flexible sigmoidoscopy or every 10 years for a colonoscopy beginning at age 69 years and continuing until age 94 years.  Hepatitis C blood test.** / For all people born from [birth year] through 1963 and any individual with known risks for hepatitis C.  Osteoporosis screening.** / A one-time screening for women ages 38 years and over and women at risk for fractures or osteoporosis.  Skin self-exam. / Monthly.  Influenza vaccine. / Every year.  Tetanus, diphtheria, and acellular pertussis (Tdap/Td) vaccine.** / 1 dose of Td every 10 years.  Varicella vaccine.** / Consult your health care provider.  Zoster vaccine.** / 1 dose for adults aged 10 years or older.  Pneumococcal 13-valent conjugate (PCV13) vaccine.** / Consult your health care provider.  Pneumococcal  polysaccharide (PPSV23) vaccine.** / 1 dose for all adults aged 11 years and older.  Meningococcal vaccine.** / Consult your health care provider.  Hepatitis A vaccine.** / Consult your health care provider.  Hepatitis B vaccine.** / Consult your health care provider.  Haemophilus influenzae type b (Hib) vaccine.** / Consult your health care provider. ** Family history and personal history of risk and conditions may change your health care provider's recommendations. Document Released: 08/02/2001 Document Revised: 10/21/2013 Document Reviewed: 11/01/2010 [facility] Patient Information 2015 [place], [place]. This information is not intended to replace advice given to you by your health care provider. Make sure you discuss any questions you have with your health care provider.    Add Fish or krill, biotin caps and zinc 50 mg daily for nails For RLS, hydrate, calcium, magnesium zinc caps

## 2014-09-28 ENCOUNTER — Encounter: Payer: Self-pay | Admitting: Family Medicine

## 2014-09-28 NOTE — Assessment & Plan Note (Signed)
Patient encouraged to maintain heart healthy diet, regular exercise, adequate sleep. Consider daily probiotics. Take medications as prescribed. Labs reviewed. Will return for pap, is on cycle

## 2014-09-28 NOTE — Progress Notes (Signed)
Kristina Watts  [id number] Aug 15, 1970 09/28/2014      Progress Note-Follow Up  Subjective  Chief Complaint  No chief complaint on file.   HPI  Patient is a 44 y.o. female in today for routine medical care. Patient in today for annual exam. Overall doing well. Continues to have significant stressors at home with her husband as well as stressors at work with her . Overall she is managing well. No recent illness. Denies CP/palp/SOB/HA/congestion/fevers/GI or GU c/o. Taking meds as prescribed  Past Medical History  Diagnosis Date  . Chicken pox as a child  . Pneumonia 1998  . Allergy     seasonal  . Asthma   . Depression   . Recurrent cold sores   . Depression with anxiety 04/18/2011  . 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 Overweight(278.02) 04/19/2011  . GDM (gestational diabetes mellitus) 04/19/2011  . History of pancreatitis 04/19/2011  . Allergic state 04/19/2011  . Preventative health care 04/19/2011  . Asthma 04/19/2011  . Tinea corporis 05/18/2011  . Fatigue 05/30/2011  . Otitis media of right ear 05/30/2011  . Dermatitis 05/18/2011  . Pharyngitis 08/23/2011  . Migraine 08/23/2011  . Pancreatitis, recurrent 12/12/2011  . Reflux 12/12/2011  . LUQ pain 12/12/2011  . Hyperlipidemia 04/27/2012  . ADD (attention deficit disorder) 04/27/2012  . Acute bronchitis 11/19/2012  . Back pain 11/07/2013    Past Surgical History  Procedure Laterality Date  . Appendectomy  2007  . Cholecystectomy  2000  . Cesarean section  1-02 and 4-04    Family History  Problem Relation Age of Onset  . Thyroid disease Mother   . Hypertension Father   . Hyperlipidemia Father   . Heart disease Father   . Diabetes Father   . COPD Father     previous smoker  . Stroke Father   . Depression Father   . Restless legs syndrome Father   . Diabetes Brother   . Cancer Maternal Grandmother     Head and Neck  . Heart disease Paternal Grandmother   . Hyperlipidemia Paternal  Grandmother   . Hypertension Paternal Grandmother   . Diabetes Paternal Grandmother   . Depression Paternal Grandmother   . Stroke Paternal Grandmother   . Restless legs syndrome Paternal Grandmother   . Heart disease Paternal Grandfather   . Hyperlipidemia Paternal Grandfather   . Hypertension Paternal Grandfather   . Stroke Paternal Grandfather   . Diabetes Paternal Grandfather     History   Social History  . Marital Status: Married    Spouse Name: N/A  . Number of Children: N/A  . Years of Education: N/A   Occupational History  . Not on file.   Social History Main Topics  . Smoking status: Never Smoker   . Smokeless tobacco: Never Used  . Alcohol Use: Yes     Comment: rarely  . Drug Use: No  . Sexual Activity:    Partners: Male   Other Topics Concern  . Not on file   Social History Narrative    Current Outpatient Prescriptions on File Prior to Visit  Medication Sig Dispense Refill  . Acetaminophen (TYLENOL EXTRA STRENGTH PO) Take 2 tablets by mouth as needed.    Marland Kitchen albuterol (PROVENTIL HFA;VENTOLIN HFA) 108 (90 BASE) MCG/ACT inhaler Inhale 2 puffs into the lungs every 4 (four) hours as needed for wheezing or shortness of breath. 18 g 5  . budesonide-formoterol (SYMBICORT) 160-4.5 MCG/ACT inhaler Inhale 2 puffs into the  lungs 2 (two) times daily. 10.2 g 4  . Butalbital-APAP-Caffeine 50-300-40 MG CAPS Take 1 capsule by mouth 3 (three) times daily as needed. HA 84 capsule 1  . citalopram (CELEXA) 10 MG tablet TAKE 1 TABLET (10 MG TOTAL) BY MOUTH 2 (TWO) TIMES DAILY. 60 tablet 3  . ibuprofen (ADVIL,MOTRIN) 200 MG tablet Take 200 mg by mouth every 6 (six) hours as needed.    . montelukast (SINGULAIR) 10 MG tablet TAKE 1 TABLET (10 MG TOTAL) BY MOUTH AT BEDTIME. 30 tablet 3  . Multiple Vitamin (MULTIVITAMIN) tablet Take 1 tablet by mouth daily.    . mupirocin ointment (BACTROBAN) 2 % Place 1 application into the nose 2 (two) times daily. Via clean qtip x 7 days 22 g 1  .  penciclovir (DENAVIR) 1 % cream Apply topically every 2 (two) hours. 1.5 g 1  . valACYclovir (VALTREX) 1000 MG tablet TAKE 1 TABLET BY MOUTH TWICE A DAY 60 tablet 1   No current facility-administered medications on file prior to visit.    Allergies  Allergen Reactions  . Actifed [Triprolidine-Pse] Hives    Review of Systems  Review of Systems  Constitutional: Positive for malaise/fatigue. Negative for fever and chills.  HENT: Negative for congestion, hearing loss and nosebleeds.   Eyes: Negative for discharge.  Respiratory: Negative for cough, sputum production, shortness of breath and wheezing.   Cardiovascular: Negative for chest pain, palpitations and leg swelling.  Gastrointestinal: Negative for heartburn, nausea, vomiting, abdominal pain, diarrhea, constipation and blood in stool.  Genitourinary: Negative for dysuria, urgency, frequency and hematuria.  Musculoskeletal: Negative for myalgias, back pain and falls.  Skin: Negative for rash.  Neurological: Negative for dizziness, tremors, sensory change, focal weakness, loss of consciousness, weakness and headaches.  Endo/Heme/Allergies: Negative for polydipsia. Does not bruise/bleed easily.  Psychiatric/Behavioral: Negative for depression and suicidal ideas. The patient is not nervous/anxious and does not have insomnia.     Objective  BP 108/74 mmHg  Pulse 86  Temp(Src) 98 F (36.7 C) (Oral)  Resp 16  Ht 5' 4.75" (1.645 m)  Wt 211 lb (95.709 kg)  BMI 35.37 kg/m2  SpO2 98%  LMP 09/19/2014  Physical Exam  Physical Exam  Constitutional: She is oriented to person, place, and time and well-developed, well-nourished, and in no distress. No distress.  HENT:  Head: Normocephalic and atraumatic.  Right Ear: External ear normal.  Left Ear: External ear normal.  Nose: Nose normal.  Mouth/Throat: Oropharynx is clear and moist. No oropharyngeal exudate.  Eyes: Conjunctivae are normal. Pupils are equal, round, and reactive to  light. Right eye exhibits no discharge. Left eye exhibits no discharge. No scleral icterus.  Neck: Normal range of motion. Neck supple. No thyromegaly present.  Cardiovascular: Normal rate, regular rhythm, normal heart sounds and intact distal pulses.   No murmur heard. Pulmonary/Chest: Effort normal and breath sounds normal. No respiratory distress. She has no wheezes. She has no rales.  Abdominal: Soft. Bowel sounds are normal. She exhibits no distension and no mass. There is no tenderness.  Musculoskeletal: Normal range of motion. She exhibits no edema or tenderness.  Lymphadenopathy:    She has no cervical adenopathy.  Neurological: She is alert and oriented to person, place, and time. She has normal reflexes. No cranial nerve deficit. Coordination normal.  Skin: Skin is warm and dry. No rash noted. She is not diaphoretic.  Psychiatric: Mood, memory and affect normal.    Lab Results  Component Value Date   TSH 1.07  06/10/2014   Lab Results  Component Value Date   WBC 6.3 06/10/2014   HGB 13.3 06/10/2014   HCT 39.2 06/10/2014   MCV 89.1 06/10/2014   PLT 285.0 06/10/2014   Lab Results  Component Value Date   CREATININE 0.6 06/10/2014   BUN 10 06/10/2014   NA 137 06/10/2014   K 4.0 06/10/2014   CL 106 06/10/2014   CO2 25 06/10/2014   Lab Results  Component Value Date   ALT 23 06/10/2014   AST 20 06/10/2014   ALKPHOS 59 06/10/2014   BILITOT 0.7 06/10/2014   Lab Results  Component Value Date   CHOL 181 06/10/2014   Lab Results  Component Value Date   HDL 42.80 06/10/2014   Lab Results  Component Value Date   LDLCALC 126* 06/10/2014   Lab Results  Component Value Date   TRIG 60.0 06/10/2014   Lab Results  Component Value Date   CHOLHDL 4 06/10/2014     Assessment & Plan  Reflux Avoid offending foods, start probiotics. Do not eat large meals in late evening and consider raising head of bed.    Hyperlipidemia Encouraged heart healthy diet, increase  exercise, avoid trans fats, consider a krill oil cap daily   ADD (attention deficit disorder) meds working well allowed refills   Preventative health care Patient encouraged to maintain heart healthy diet, regular exercise, adequate sleep. Consider daily probiotics. Take medications as prescribed. Labs reviewed. Will return for pap, is on cycle

## 2014-09-28 NOTE — Assessment & Plan Note (Signed)
meds working well allowed refills

## 2014-09-28 NOTE — Assessment & Plan Note (Signed)
Avoid offending foods, start probiotics. Do not eat large meals in late evening and consider raising head of bed.  

## 2014-09-28 NOTE — Assessment & Plan Note (Signed)
Encouraged heart healthy diet, increase exercise, avoid trans fats, consider a krill oil cap daily 

## 2014-10-03 ENCOUNTER — Telehealth: Payer: Self-pay | Admitting: Family Medicine

## 2014-10-03 MED ORDER — AMOXICILLIN 500 MG PO TABS: 500.0000 mg | ORAL_TABLET | Freq: Three times a day (TID) | ORAL | Status: DC

## 2014-10-03 NOTE — Telephone Encounter (Signed)
Caller name: Shanon Brow Relation to pt: husband Call back number: [phone] Pharmacy: [facility]  Reason for call:   Patient daughter was diagnosed with strep throat and now patient has same symptoms and is wanting to know if something could be called in?

## 2014-10-03 NOTE — Telephone Encounter (Signed)
She can have Amoxicillin 500 mg po tid x 10 days and start a probiotic daily. Put all of the tooth brushes in the house in the dishwasher also

## 2014-10-03 NOTE — Telephone Encounter (Signed)
Sent in  Prescription to [facility] and husband informed sent in. Informed of PCP instructions.

## 2014-12-03 ENCOUNTER — Other Ambulatory Visit: Payer: Self-pay | Admitting: Family Medicine

## 2014-12-23 ENCOUNTER — Encounter: Payer: Self-pay | Admitting: Family Medicine

## 2014-12-23 ENCOUNTER — Ambulatory Visit (INDEPENDENT_AMBULATORY_CARE_PROVIDER_SITE_OTHER): Payer: Managed Care, Other (non HMO) | Admitting: Family Medicine

## 2014-12-23 ENCOUNTER — Other Ambulatory Visit (HOSPITAL_COMMUNITY)
Admission: RE | Admit: 2014-12-23 | Discharge: 2014-12-23 | Disposition: A | Discharge status: Home / Self Care | Payer: Managed Care, Other (non HMO) | Source: Ambulatory Visit | Attending: Family Medicine | Admitting: Family Medicine

## 2014-12-23 VITALS — BP 112/72 | HR 89 | Temp 98.1°F | Ht 65.0 in | Wt 211.2 lb

## 2014-12-23 DIAGNOSIS — K219 Gastro-esophageal reflux disease without esophagitis: Secondary | ICD-10-CM

## 2014-12-23 DIAGNOSIS — Z01411 Encounter for gynecological examination (general) (routine) with abnormal findings: Secondary | ICD-10-CM | POA: Diagnosis present

## 2014-12-23 DIAGNOSIS — Z1239 Encounter for other screening for malignant neoplasm of breast: Secondary | ICD-10-CM | POA: Diagnosis not present

## 2014-12-23 DIAGNOSIS — E782 Mixed hyperlipidemia: Secondary | ICD-10-CM

## 2014-12-23 DIAGNOSIS — F329 Major depressive disorder, single episode, unspecified: Secondary | ICD-10-CM | POA: Diagnosis not present

## 2014-12-23 DIAGNOSIS — Z0000 Encounter for general adult medical examination without abnormal findings: Secondary | ICD-10-CM

## 2014-12-23 DIAGNOSIS — Z124 Encounter for screening for malignant neoplasm of cervix: Secondary | ICD-10-CM | POA: Diagnosis not present

## 2014-12-23 DIAGNOSIS — M549 Dorsalgia, unspecified: Secondary | ICD-10-CM

## 2014-12-23 DIAGNOSIS — E785 Hyperlipidemia, unspecified: Secondary | ICD-10-CM

## 2014-12-23 DIAGNOSIS — IMO0001 Reserved for inherently not codable concepts without codable children: Secondary | ICD-10-CM

## 2014-12-23 DIAGNOSIS — F988 Other specified behavioral and emotional disorders with onset usually occurring in childhood and adolescence: Secondary | ICD-10-CM

## 2014-12-23 DIAGNOSIS — F418 Other specified anxiety disorders: Secondary | ICD-10-CM

## 2014-12-23 DIAGNOSIS — J4520 Mild intermittent asthma, uncomplicated: Secondary | ICD-10-CM

## 2014-12-23 DIAGNOSIS — F32A Depression, unspecified: Secondary | ICD-10-CM

## 2014-12-23 DIAGNOSIS — R5383 Other fatigue: Secondary | ICD-10-CM

## 2014-12-23 DIAGNOSIS — F909 Attention-deficit hyperactivity disorder, unspecified type: Secondary | ICD-10-CM

## 2014-12-23 HISTORY — DX: Encounter for screening for malignant neoplasm of cervix: Z12.4

## 2014-12-23 LAB — LIPID PANEL
CHOL/HDL RATIO: 3
Cholesterol: 171 mg/dL (ref 0–200)
HDL: 49 mg/dL (ref >39.00)
LDL Cholesterol: 110 mg/dL — ABNORMAL HIGH (ref 0–99)
NONHDL: 122
Triglycerides: 62 mg/dL (ref 0.0–149.0)
VLDL: 12.4 mg/dL (ref 0.0–40.0)

## 2014-12-23 LAB — COMPREHENSIVE METABOLIC PANEL
ALT: 28 U/L (ref 0–35)
AST: 19 U/L (ref 0–37)
Albumin: 4.1 g/dL (ref 3.5–5.2)
Alkaline Phosphatase: 63 U/L (ref 39–117)
BUN: 10 mg/dL (ref 6–23)
CALCIUM: 9.1 mg/dL (ref 8.4–10.5)
CHLORIDE: 104 meq/L (ref 96–112)
CO2: 27 meq/L (ref 19–32)
CREATININE: 0.64 mg/dL (ref 0.40–1.20)
GFR: 108.28 mL/min (ref >60.00)
Glucose, Bld: 93 mg/dL (ref 70–99)
Potassium: 4.2 mEq/L (ref 3.5–5.1)
Sodium: 137 mEq/L (ref 135–145)
Total Bilirubin: 0.5 mg/dL (ref 0.2–1.2)
Total Protein: 7 g/dL (ref 6.0–8.3)

## 2014-12-23 LAB — CBC
HCT: 40 % (ref 36.0–46.0)
Hemoglobin: 13.6 g/dL (ref 12.0–15.0)
MCHC: 33.9 g/dL (ref 30.0–36.0)
MCV: 89.2 fl (ref 78.0–100.0)
Platelets: 285 10*3/uL (ref 150.0–400.0)
RBC: 4.49 Mil/uL (ref 3.87–5.11)
RDW: 12.8 % (ref 11.5–15.5)
WBC: 7.1 10*3/uL (ref 4.0–10.5)

## 2014-12-23 LAB — TSH: TSH: 1.65 u[IU]/mL (ref 0.35–4.50)

## 2014-12-23 LAB — T4, FREE: FREE T4: 0.85 ng/dL (ref 0.60–1.60)

## 2014-12-23 MED ORDER — PREDNISONE 20 MG PO TABS: 20.0000 mg | ORAL_TABLET | Freq: Two times a day (BID) | ORAL | Status: DC

## 2014-12-23 MED ORDER — CITALOPRAM HYDROBROMIDE 10 MG PO TABS: 10.0000 mg | ORAL_TABLET | Freq: Three times a day (TID) | ORAL | Status: DC

## 2014-12-23 NOTE — Progress Notes (Signed)
Pre visit review using our clinic review tool, if applicable. No additional management support is needed unless otherwise documented below in the visit note. 

## 2014-12-23 NOTE — Assessment & Plan Note (Signed)
Pap today, no concerns on exam.  

## 2014-12-23 NOTE — Patient Instructions (Signed)

## 2014-12-23 NOTE — Progress Notes (Signed)
Kristina Watts  [id number] 09/22/1970 12/23/2014      Progress Note-Follow Up  Subjective  Chief Complaint  Chief Complaint  Patient presents with  . Gynecologic Exam  . Asthma    HPI  Patient is a 44 y.o. female in today for routine medical care. Patient patient is in today for Pap smear and further evaluation. She notes with the hot humid weather she's been having more trouble shortness of breath and wheezing. Gets temporary relief from albuterol but symptoms return. No fevers or chills. No GYN complaints today but is due for a Pap smear. Continues to struggle with great deal of stress due to her job, [employer], children and ailing husband. No suicidal or homicidal ideation but does acknowledge anhedonia and a significant level of fatigue. Denies CP/palp/SOB/HA/congestion/fevers/GI or GU c/o. Taking meds as prescribed   Past Medical History  Diagnosis Date  . Chicken pox as a child  . Pneumonia 1998  . Allergy     seasonal  . Asthma   . Depression   . Recurrent cold sores   . Depression with anxiety 04/18/2011  . 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 Overweight(278.02) 04/19/2011  . GDM (gestational diabetes mellitus) 04/19/2011  . History of pancreatitis 04/19/2011  . Allergic state 04/19/2011  . Preventative health care 04/19/2011  . Asthma 04/19/2011  . Tinea corporis 05/18/2011  . Fatigue 05/30/2011  . Otitis media of right ear 05/30/2011  . Dermatitis 05/18/2011  . Pharyngitis 08/23/2011  . Migraine 08/23/2011  . Pancreatitis, recurrent 12/12/2011  . Reflux 12/12/2011  . LUQ pain 12/12/2011  . Hyperlipidemia 04/27/2012  . ADD (attention deficit disorder) 04/27/2012  . Acute bronchitis 11/19/2012  . Back pain 11/07/2013    Past Surgical History  Procedure Laterality Date  . Appendectomy  2007  . Cholecystectomy  2000  . Cesarean section  1-02 and 4-04    Family History  Problem Relation Age of Onset  . Thyroid disease Mother   . Hypertension Father    . Hyperlipidemia Father   . Heart disease Father   . Diabetes Father   . COPD Father     previous smoker  . Stroke Father   . Depression Father   . Restless legs syndrome Father   . Diabetes Brother   . Cancer Maternal Grandmother     Head and Neck  . Heart disease Paternal Grandmother   . Hyperlipidemia Paternal Grandmother   . Hypertension Paternal Grandmother   . Diabetes Paternal Grandmother   . Depression Paternal Grandmother   . Stroke Paternal Grandmother   . Restless legs syndrome Paternal Grandmother   . Heart disease Paternal Grandfather   . Hyperlipidemia Paternal Grandfather   . Hypertension Paternal Grandfather   . Stroke Paternal Grandfather   . Diabetes Paternal Grandfather     History   Social History  . Marital Status: Married    Spouse Name: N/A  . Number of Children: N/A  . Years of Education: N/A   Occupational History  . Not on file.   Social History Main Topics  . Smoking status: Never Smoker   . Smokeless tobacco: Never Used  . Alcohol Use: Yes     Comment: rarely  . Drug Use: No  . Sexual Activity:    Partners: Male   Other Topics Concern  . Not on file   Social History Narrative    Current Outpatient Prescriptions on File Prior to Visit  Medication Sig Dispense Refill  .  Acetaminophen (TYLENOL EXTRA STRENGTH PO) Take 2 tablets by mouth as needed.    Marland Kitchen albuterol (PROVENTIL HFA;VENTOLIN HFA) 108 (90 BASE) MCG/ACT inhaler Inhale 2 puffs into the lungs every 4 (four) hours as needed for wheezing or shortness of breath. 18 g 5  . amoxicillin (AMOXIL) 500 MG tablet Take 1 tablet (500 mg total) by mouth 3 (three) times daily. 30 tablet 0  . amphetamine-dextroamphetamine (ADDERALL) 10 MG tablet Take 1 tablet (10 mg total) by mouth as directed. July 2016 rx 30 tablet 0  . budesonide-formoterol (SYMBICORT) 160-4.5 MCG/ACT inhaler Inhale 2 puffs into the lungs 2 (two) times daily. 10.2 g 4  . Butalbital-APAP-Caffeine 50-300-40 MG CAPS Take 1  capsule by mouth 3 (three) times daily as needed. HA 84 capsule 1  . citalopram (CELEXA) 10 MG tablet TAKE 1 TABLET (10 MG TOTAL) BY MOUTH 2 (TWO) TIMES DAILY. 60 tablet 3  . ibuprofen (ADVIL,MOTRIN) 200 MG tablet Take 200 mg by mouth every 6 (six) hours as needed.    Marland Kitchen lisdexamfetamine (VYVANSE) 70 MG capsule Take 1 capsule (70 mg total) by mouth daily. January 09, 2015  RX 30 capsule 0  . montelukast (SINGULAIR) 10 MG tablet TAKE 1 TABLET (10 MG TOTAL) BY MOUTH AT BEDTIME. 30 tablet 3  . Multiple Vitamin (MULTIVITAMIN) tablet Take 1 tablet by mouth daily.    . mupirocin ointment (BACTROBAN) 2 % Place 1 application into the nose 2 (two) times daily. Via clean qtip x 7 days 22 g 1  . penciclovir (DENAVIR) 1 % cream Apply topically every 2 (two) hours. 1.5 g 1  . valACYclovir (VALTREX) 1000 MG tablet TAKE 1 TABLET BY MOUTH TWICE A DAY 60 tablet 1   No current facility-administered medications on file prior to visit.    Allergies  Allergen Reactions  . Actifed [Triprolidine-Pse] Hives    Review of Systems  Review of Systems  Constitutional: Positive for malaise/fatigue. Negative for fever and chills.  HENT: Positive for congestion. Negative for hearing loss and nosebleeds.   Eyes: Negative for discharge.  Respiratory: Positive for shortness of breath and wheezing. Negative for cough and sputum production.   Cardiovascular: Negative for chest pain, palpitations and leg swelling.  Gastrointestinal: Negative for heartburn, nausea, vomiting, abdominal pain, diarrhea, constipation and blood in stool.  Genitourinary: Negative for dysuria, urgency, frequency and hematuria.  Musculoskeletal: Negative for myalgias, back pain and falls.  Skin: Negative for rash.  Neurological: Negative for dizziness, tremors, sensory change, focal weakness, loss of consciousness, weakness and headaches.  Endo/Heme/Allergies: Negative for polydipsia. Does not bruise/bleed easily.  Psychiatric/Behavioral: Positive for  depression. Negative for suicidal ideas. The patient is nervous/anxious. The patient does not have insomnia.     Objective  BP 112/72 mmHg  Pulse 89  Temp(Src) 98.1 F (36.7 C) (Oral)  Ht 5\' 5"  (1.651 m)  Wt 211 lb 4 oz (95.822 kg)  BMI 35.15 kg/m2  SpO2 97%  Physical Exam  Physical Exam  Constitutional: She is oriented to person, place, and time and well-developed, well-nourished, and in no distress. No distress.  HENT:  Head: Normocephalic and atraumatic.  Right Ear: External ear normal.  Left Ear: External ear normal.  Nose: Nose normal.  Mouth/Throat: Oropharynx is clear and moist. No oropharyngeal exudate.  Eyes: Conjunctivae are normal. Pupils are equal, round, and reactive to light. Right eye exhibits no discharge. Left eye exhibits no discharge. No scleral icterus.  Neck: Normal range of motion. Neck supple. No thyromegaly present.  Cardiovascular: Normal  rate, regular rhythm, normal heart sounds and intact distal pulses.   No murmur heard. Pulmonary/Chest: Effort normal and breath sounds normal. No respiratory distress. She has no wheezes. She has no rales.  Abdominal: Soft. Bowel sounds are normal. She exhibits no distension and no mass. There is no tenderness.  Genitourinary: Vagina normal, uterus normal, cervix normal, right adnexa normal and left adnexa normal. No vaginal discharge found.  Musculoskeletal: Normal range of motion. She exhibits no edema or tenderness.  Lymphadenopathy:    She has no cervical adenopathy.  Neurological: She is alert and oriented to person, place, and time. She has normal reflexes. No cranial nerve deficit. Coordination normal.  Skin: Skin is warm and dry. No rash noted. She is not diaphoretic.  Psychiatric: Mood, memory and affect normal.    Lab Results  Component Value Date   TSH 1.07 06/10/2014   Lab Results  Component Value Date   WBC 6.3 06/10/2014   HGB 13.3 06/10/2014   HCT 39.2 06/10/2014   MCV 89.1 06/10/2014   PLT  285.0 06/10/2014   Lab Results  Component Value Date   CREATININE 0.6 06/10/2014   BUN 10 06/10/2014   NA 137 06/10/2014   K 4.0 06/10/2014   CL 106 06/10/2014   CO2 25 06/10/2014   Lab Results  Component Value Date   ALT 23 06/10/2014   AST 20 06/10/2014   ALKPHOS 59 06/10/2014   BILITOT 0.7 06/10/2014   Lab Results  Component Value Date   CHOL 181 06/10/2014   Lab Results  Component Value Date   HDL 42.80 06/10/2014   Lab Results  Component Value Date   LDLCALC 126* 06/10/2014   Lab Results  Component Value Date   TRIG 60.0 06/10/2014   Lab Results  Component Value Date   CHOLHDL 4 06/10/2014     Assessment & Plan  Cervical cancer screening Pap today, no concerns on exam.   Fatigue Will check labs again, slept 16 hours of day after school let out.   Reflux Avoid offending foods, take probiotics. Do not eat large meals in late evening and consider raising head of bed.   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 Will try Citalopram and encouraged to consider individual or family counseling to help kids deal with their father's MS

## 2014-12-23 NOTE — Assessment & Plan Note (Signed)
Will check labs again, slept 16 hours of day after school let out.

## 2014-12-24 LAB — CYTOLOGY - PAP

## 2014-12-28 NOTE — Assessment & Plan Note (Signed)
Recent increased wheezing with hot humid weather, can use Albuterol as needed and may need to consider pulmonary referral if continues to worsen

## 2014-12-28 NOTE — Assessment & Plan Note (Addendum)
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 Will try Citalopram and encouraged to consider individual or family counseling to help kids deal with their father's MS

## 2014-12-28 NOTE — Assessment & Plan Note (Signed)
Avoid offending foods, take probiotics. Do not eat large meals in late evening and consider raising head of bed.  

## 2014-12-28 NOTE — Assessment & Plan Note (Signed)
Encouraged heart healthy diet, increase exercise, avoid trans fats, consider a krill oil cap daily 

## 2014-12-28 NOTE — Assessment & Plan Note (Signed)
Doing well on current meds, no recent changes

## 2015-01-15 ENCOUNTER — Encounter: Payer: Self-pay | Admitting: Medical

## 2015-01-15 ENCOUNTER — Ambulatory Visit (INDEPENDENT_AMBULATORY_CARE_PROVIDER_SITE_OTHER): Payer: Managed Care, Other (non HMO) | Admitting: Medical

## 2015-01-15 VITALS — BP 121/70 | HR 93 | Temp 98.4°F | Ht 65.0 in | Wt 215.6 lb

## 2015-01-15 DIAGNOSIS — J029 Acute pharyngitis, unspecified: Secondary | ICD-10-CM | POA: Diagnosis not present

## 2015-01-15 LAB — POCT RAPID STREP A (OFFICE)
RAPID STREP A SCREEN: NEGATIVE
RAPID STREP A SCREEN: NEGATIVE

## 2015-01-15 MED ORDER — AMOXICILLIN 500 MG PO CAPS: 500.0000 mg | ORAL_CAPSULE | Freq: Two times a day (BID) | ORAL | Status: DC

## 2015-01-15 NOTE — Patient Instructions (Addendum)
Your strep test was negative. However, your physical exam and clinical presentation is suspicious for strep and it is important to note that rapid strep test can be falsely negative. So I am going to give you amoxicillin antibiotic today based on your exam and clinical presentation.  Rest hydrate, tylenol for fever, and warm salt water gargles.   Follow up in 7 days or as needed.    

## 2015-01-15 NOTE — Addendum Note (Signed)
Addended by: Bunnie Domino on: 01/15/2015 01:43 PM   Modules accepted: Orders

## 2015-01-15 NOTE — Progress Notes (Signed)
Subjective:    Patient ID: Kristina Watts, female    DOB: 08/25/1970, 44 y.o.   MRN: [id number]  HPI  Pt has st. Hurts to swallow. Onset 3 days ago. Hurts to swallow saliva. No close contacts known sick. 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  Some fatigue.  No obvious skin rashes except faint red area corner of rt eye.  Pt has felt febrile. But no t check.  Not sore like allergic pnd per pt and none of those associated.   Review of Systems  Constitutional: Positive for fever and fatigue. Negative for chills.  HENT: Positive for sore throat.   Respiratory: Negative for apnea, cough, choking, chest tightness, shortness of breath and wheezing.   Cardiovascular: Negative for chest pain and palpitations.  Musculoskeletal: Negative for back pain.  Skin: Negative for rash.       None obvious seen hpi.  Neurological: Negative for dizziness, facial asymmetry, weakness, light-headedness and headaches.  Hematological: Positive for adenopathy. Does not bruise/bleed easily.    Past Medical History  Diagnosis Date  . Chicken pox as a child  . Pneumonia 1998  . Allergy     seasonal  . Asthma   . Depression   . Recurrent cold sores   . Depression with anxiety 04/18/2011  . 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 Overweight(278.02) 04/19/2011  . GDM (gestational diabetes mellitus) 04/19/2011  . History of pancreatitis 04/19/2011  . Allergic state 04/19/2011  . Preventative health care 04/19/2011  . Asthma 04/19/2011  . Tinea corporis 05/18/2011  . Fatigue 05/30/2011  . Otitis media of right ear 05/30/2011  . Dermatitis 05/18/2011  . Pharyngitis 08/23/2011  . Migraine 08/23/2011  . Pancreatitis, recurrent 12/12/2011  . Reflux 12/12/2011  . LUQ pain 12/12/2011  . Hyperlipidemia 04/27/2012  . ADD (attention deficit disorder) 04/27/2012  . Acute bronchitis 11/19/2012  . Back pain 11/07/2013  . Cervical cancer screening 12/23/2014    History   Social History  .  Marital Status: Married    Spouse Name: N/A  . Number of Children: N/A  . Years of Education: N/A   Occupational History  . Not on file.   Social History Main Topics  . Smoking status: Never Smoker   . Smokeless tobacco: Never Used  . Alcohol Use: Yes     Comment: rarely  . Drug Use: No  . Sexual Activity:    Partners: Male   Other Topics Concern  . Not on file   Social History Narrative    Past Surgical History  Procedure Laterality Date  . Appendectomy  2007  . Cholecystectomy  2000  . Cesarean section  1-02 and 4-04    Family History  Problem Relation Age of Onset  . Thyroid disease Mother   . Hypertension Father   . Hyperlipidemia Father   . Heart disease Father   . Diabetes Father   . COPD Father     previous smoker  . Stroke Father   . Depression Father   . Restless legs syndrome Father   . Diabetes Brother   . Cancer Maternal Grandmother     Head and Neck  . Heart disease Paternal Grandmother   . Hyperlipidemia Paternal Grandmother   . Hypertension Paternal Grandmother   . Diabetes Paternal Grandmother   . Depression Paternal Grandmother   . Stroke Paternal Grandmother   . Restless legs syndrome Paternal Grandmother   . Heart disease Paternal Grandfather   . Hyperlipidemia Paternal  Grandfather   . Hypertension Paternal Grandfather   . Stroke Paternal Grandfather   . Diabetes Paternal Grandfather     Allergies  Allergen Reactions  . Actifed [Triprolidine-Pse] Hives    Current Outpatient Prescriptions on File Prior to Visit  Medication Sig Dispense Refill  . Acetaminophen (TYLENOL EXTRA STRENGTH PO) Take 2 tablets by mouth as needed.    Marland Kitchen albuterol (PROVENTIL HFA;VENTOLIN HFA) 108 (90 BASE) MCG/ACT inhaler Inhale 2 puffs into the lungs every 4 (four) hours as needed for wheezing or shortness of breath. 18 g 5  . amphetamine-dextroamphetamine (ADDERALL) 10 MG tablet Take 1 tablet (10 mg total) by mouth as directed. July 2016 rx 30 tablet 0  .  budesonide-formoterol (SYMBICORT) 160-4.5 MCG/ACT inhaler Inhale 2 puffs into the lungs 2 (two) times daily. 10.2 g 4  . Butalbital-APAP-Caffeine 50-300-40 MG CAPS Take 1 capsule by mouth 3 (three) times daily as needed. HA 84 capsule 1  . citalopram (CELEXA) 10 MG tablet Take 1 tablet (10 mg total) by mouth 3 (three) times daily. TAKE 1 TABLET BY MOUTH IN THE MORNING AND TAKE 2 TABLETS BY MOUTH AT BED TIME. 270 tablet 1  . ibuprofen (ADVIL,MOTRIN) 200 MG tablet Take 200 mg by mouth every 6 (six) hours as needed.    Marland Kitchen lisdexamfetamine (VYVANSE) 70 MG capsule Take 1 capsule (70 mg total) by mouth daily. January 09, 2015  RX 30 capsule 0  . montelukast (SINGULAIR) 10 MG tablet TAKE 1 TABLET (10 MG TOTAL) BY MOUTH AT BEDTIME. 30 tablet 3  . Multiple Vitamin (MULTIVITAMIN) tablet Take 1 tablet by mouth daily.    . mupirocin ointment (BACTROBAN) 2 % Place 1 application into the nose 2 (two) times daily. Via clean qtip x 7 days 22 g 1  . penciclovir (DENAVIR) 1 % cream Apply topically every 2 (two) hours. 1.5 g 1  . valACYclovir (VALTREX) 1000 MG tablet TAKE 1 TABLET BY MOUTH TWICE A DAY 60 tablet 1   No current facility-administered medications on file prior to visit.    BP 121/70 mmHg  Pulse 93  Temp(Src) 98.4 F (36.9 C) (Oral)  Ht 5\' 5"  (1.651 m)  Wt 215 lb 9.6 oz (97.796 kg)  BMI 35.88 kg/m2  SpO2 97%  LMP 12/31/2014      Objective:   Physical Exam General- No acute distress, pleasant pt.  Neck- from, No nuccal rigidity, Mild submandibular node hypertrophy lt side.  Lungs- Clear even and unlabored.  Heart- Regular, rate and rhythm. HEENT- Head- normocephalic Eyes- PEERL bilaterally. Ears- Canals clear, normal tm lt side. Upper 1/3 portion left side mild red. Nose- No frontal or maxillary sinus tenderness to palpation. Turbinates normal. Throat- posterior pharynx shows   Moderate posterior pharynx  erythma, 1+ tonsillar hypertrophy, no  discharge.   Neurologic- CN III- XII  grossly intact.       Assessment & Plan:  Your strep test was negative. However, your physical exam and clinical presentation is suspicious for strep and it is important to note that rapid strep test can be falsely negative. So I am going to give you amoxicillin antibiotic today based on your exam and clinical presentation.  Rest hydrate, tylenol for fever, and warm salt water gargles.   Follow up in 7 days or as needed

## 2015-01-15 NOTE — Progress Notes (Signed)
Pre visit review using our clinic review tool, if applicable. No additional management support is needed unless otherwise documented below in the visit note. 

## 2015-02-27 ENCOUNTER — Telehealth: Payer: Self-pay | Admitting: Family Medicine

## 2015-02-27 DIAGNOSIS — F988 Other specified behavioral and emotional disorders with onset usually occurring in childhood and adolescence: Secondary | ICD-10-CM

## 2015-02-27 MED ORDER — LISDEXAMFETAMINE DIMESYLATE 70 MG PO CAPS: 70.0000 mg | ORAL_CAPSULE | Freq: Every day | ORAL | Status: DC

## 2015-02-27 NOTE — Telephone Encounter (Signed)
Printed and PCP signed and called the patient informed to pickup at the front desk.

## 2015-02-27 NOTE — Telephone Encounter (Signed)
Caller name: Aylla Huffine Relation to pt: spouse Call back [phone] Pharmacy:  Reason for call: Pt's spouse states pt is needing rx for lisdexamfetamine (VYVANSE) 70 MG capsule. Please advise.

## 2015-03-26 ENCOUNTER — Encounter: Payer: Self-pay | Admitting: Family Medicine

## 2015-03-26 ENCOUNTER — Other Ambulatory Visit: Payer: Self-pay | Admitting: Family Medicine

## 2015-03-26 ENCOUNTER — Ambulatory Visit (INDEPENDENT_AMBULATORY_CARE_PROVIDER_SITE_OTHER): Payer: Managed Care, Other (non HMO) | Admitting: Family Medicine

## 2015-03-26 VITALS — BP 121/79 | HR 86 | Temp 97.8°F | Ht 65.0 in | Wt 216.2 lb

## 2015-03-26 DIAGNOSIS — Z23 Encounter for immunization: Secondary | ICD-10-CM

## 2015-03-26 DIAGNOSIS — E663 Overweight: Secondary | ICD-10-CM | POA: Diagnosis not present

## 2015-03-26 DIAGNOSIS — F909 Attention-deficit hyperactivity disorder, unspecified type: Secondary | ICD-10-CM | POA: Diagnosis not present

## 2015-03-26 DIAGNOSIS — F988 Other specified behavioral and emotional disorders with onset usually occurring in childhood and adolescence: Secondary | ICD-10-CM

## 2015-03-26 DIAGNOSIS — J4520 Mild intermittent asthma, uncomplicated: Secondary | ICD-10-CM | POA: Diagnosis not present

## 2015-03-26 MED ORDER — AMPHETAMINE-DEXTROAMPHETAMINE 10 MG PO TABS: 10.0000 mg | ORAL_TABLET | ORAL | Status: DC

## 2015-03-26 MED ORDER — AMPHETAMINE-DEXTROAMPHETAMINE 10 MG PO TABS: 10.0000 mg | ORAL_TABLET | Freq: Every day | ORAL | Status: DC

## 2015-03-26 MED ORDER — LISDEXAMFETAMINE DIMESYLATE 70 MG PO CAPS: 70.0000 mg | ORAL_CAPSULE | Freq: Every day | ORAL | Status: DC

## 2015-03-26 NOTE — Progress Notes (Signed)
Pre visit review using our clinic review tool, if applicable. No additional management support is needed unless otherwise documented below in the visit note. 

## 2015-03-26 NOTE — Patient Instructions (Signed)

## 2015-03-29 ENCOUNTER — Encounter: Payer: Self-pay | Admitting: Family Medicine

## 2015-03-30 ENCOUNTER — Encounter: Payer: Self-pay | Admitting: Family Medicine

## 2015-03-30 ENCOUNTER — Other Ambulatory Visit: Payer: Self-pay | Admitting: Family Medicine

## 2015-03-30 MED ORDER — BUDESONIDE-FORMOTEROL FUMARATE 160-4.5 MCG/ACT IN AERO: 2.0000 | INHALATION_SPRAY | Freq: Two times a day (BID) | RESPIRATORY_TRACT | Status: DC

## 2015-03-30 MED ORDER — ALBUTEROL SULFATE HFA 108 (90 BASE) MCG/ACT IN AERS: 2.0000 | INHALATION_SPRAY | RESPIRATORY_TRACT | Status: DC | PRN

## 2015-03-30 NOTE — Progress Notes (Signed)
Subjective:    Patient ID: Kristina Watts, female    DOB: October 11, 1970, 44 y.o.   MRN: [id number]  Chief Complaint  Patient presents with  . Follow-up    HPI Patient is in today for patient is in today for follow-up. She continues to work full-time care for her children and husband and go to school and is best struggling with fatigue but offers no other significant complaints. Allergies and asthma continue to cause some wheezing but this is mild and stable. No fevers or recent acute illness. Denies CP/palp/SOB/HA/congestion/fevers/GI or GU c/o. Taking meds as prescribed  Past Medical History  Diagnosis Date  . Chicken pox as a child  . Pneumonia 1998  . Allergy     seasonal  . Asthma   . Depression   . Recurrent cold sores   . Depression with anxiety 04/18/2011  . 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 Overweight(278.02) 04/19/2011  . GDM (gestational diabetes mellitus) 04/19/2011  . History of pancreatitis 04/19/2011  . Allergic state 04/19/2011  . Preventative health care 04/19/2011  . Asthma 04/19/2011  . Tinea corporis 05/18/2011  . Fatigue 05/30/2011  . Otitis media of right ear 05/30/2011  . Dermatitis 05/18/2011  . Pharyngitis 08/23/2011  . Migraine 08/23/2011  . Pancreatitis, recurrent ([place]) 12/12/2011  . Reflux 12/12/2011  . LUQ pain 12/12/2011  . Hyperlipidemia 04/27/2012  . ADD (attention deficit disorder) 04/27/2012  . Acute bronchitis 11/19/2012  . Back pain 11/07/2013  . Cervical cancer screening 12/23/2014    Past Surgical History  Procedure Laterality Date  . Appendectomy  2007  . Cholecystectomy  2000  . Cesarean section  1-02 and 4-04    Family History  Problem Relation Age of Onset  . Thyroid disease Mother   . Hypertension Father   . Hyperlipidemia Father   . Heart disease Father   . Diabetes Father   . COPD Father     previous smoker  . Stroke Father   . Depression Father   . Restless legs syndrome Father   . Diabetes Brother   .  Cancer Maternal Grandmother     Head and Neck  . Heart disease Paternal Grandmother   . Hyperlipidemia Paternal Grandmother   . Hypertension Paternal Grandmother   . Diabetes Paternal Grandmother   . Depression Paternal Grandmother   . Stroke Paternal Grandmother   . Restless legs syndrome Paternal Grandmother   . Heart disease Paternal Grandfather   . Hyperlipidemia Paternal Grandfather   . Hypertension Paternal Grandfather   . Stroke Paternal Grandfather   . Diabetes Paternal Grandfather     Social History   Social History  . Marital Status: Married    Spouse Name: N/A  . Number of Children: N/A  . Years of Education: N/A   Occupational History  . Not on file.   Social History Main Topics  . Smoking status: Never Smoker   . Smokeless tobacco: Never Used  . Alcohol Use: Yes     Comment: rarely  . Drug Use: No  . Sexual Activity:    Partners: Male   Other Topics Concern  . Not on file   Social History Narrative    Outpatient Prescriptions Prior to Visit  Medication Sig Dispense Refill  . Acetaminophen (TYLENOL EXTRA STRENGTH PO) Take 2 tablets by mouth as needed.    Marland Kitchen albuterol (PROVENTIL HFA;VENTOLIN HFA) 108 (90 BASE) MCG/ACT inhaler Inhale 2 puffs into the lungs every 4 (four) hours as needed for  wheezing or shortness of breath. 18 g 5  . budesonide-formoterol (SYMBICORT) 160-4.5 MCG/ACT inhaler Inhale 2 puffs into the lungs 2 (two) times daily. 10.2 g 4  . Butalbital-APAP-Caffeine 50-300-40 MG CAPS Take 1 capsule by mouth 3 (three) times daily as needed. HA 84 capsule 1  . citalopram (CELEXA) 10 MG tablet Take 1 tablet (10 mg total) by mouth 3 (three) times daily. TAKE 1 TABLET BY MOUTH IN THE MORNING AND TAKE 2 TABLETS BY MOUTH AT BED TIME. 270 tablet 1  . ibuprofen (ADVIL,MOTRIN) 200 MG tablet Take 200 mg by mouth every 6 (six) hours as needed.    . montelukast (SINGULAIR) 10 MG tablet TAKE 1 TABLET (10 MG TOTAL) BY MOUTH AT BEDTIME. 30 tablet 6  . Multiple  Vitamin (MULTIVITAMIN) tablet Take 1 tablet by mouth daily.    . mupirocin ointment (BACTROBAN) 2 % Place 1 application into the nose 2 (two) times daily. Via clean qtip x 7 days 22 g 1  . penciclovir (DENAVIR) 1 % cream Apply topically every 2 (two) hours. 1.5 g 1  . valACYclovir (VALTREX) 1000 MG tablet TAKE 1 TABLET BY MOUTH TWICE A DAY 60 tablet 1  . amphetamine-dextroamphetamine (ADDERALL) 10 MG tablet Take 1 tablet (10 mg total) by mouth as directed. July 2016 rx 30 tablet 0  . lisdexamfetamine (VYVANSE) 70 MG capsule Take 1 capsule (70 mg total) by mouth daily. January 09, 2015  RX 30 capsule 0  . amoxicillin (AMOXIL) 500 MG capsule Take 1 capsule (500 mg total) by mouth 2 (two) times daily. 20 capsule 0   No facility-administered medications prior to visit.    Allergies  Allergen Reactions  . Actifed [Triprolidine-Pse] Hives    Review of Systems  Constitutional: Positive for malaise/fatigue. Negative for fever.  HENT: Negative for congestion.   Eyes: Negative for discharge.  Respiratory: Negative for shortness of breath.   Cardiovascular: Negative for chest pain, palpitations and leg swelling.  Gastrointestinal: Negative for nausea and abdominal pain.  Genitourinary: Negative for dysuria.  Musculoskeletal: Negative for falls.  Skin: Negative for rash.  Neurological: Negative for loss of consciousness and headaches.  Endo/Heme/Allergies: Negative for environmental allergies.  Psychiatric/Behavioral: Negative for depression. The patient is not nervous/anxious.        Objective:    Physical Exam  Constitutional: She is oriented to person, place, and time. She appears well-developed and well-nourished. No distress.  HENT:  Head: Normocephalic and atraumatic.  Nose: Nose normal.  Eyes: Right eye exhibits no discharge. Left eye exhibits no discharge.  Neck: Normal range of motion. Neck supple.  Cardiovascular: Normal rate and regular rhythm.   No murmur  heard. Pulmonary/Chest: Effort normal and breath sounds normal.  Abdominal: Soft. Bowel sounds are normal. There is no tenderness.  Musculoskeletal: She exhibits no edema.  Neurological: She is alert and oriented to person, place, and time.  Skin: Skin is warm and dry.  Psychiatric: She has a normal mood and affect.  Nursing note and vitals reviewed.   BP 121/79 mmHg  Pulse 86  Temp(Src) 97.8 F (36.6 C) (Oral)  Ht 5\' 5"  (1.651 m)  Wt 216 lb 4 oz (98.09 kg)  BMI 35.99 kg/m2  SpO2 97% Wt Readings from Last 3 Encounters:  03/26/15 216 lb 4 oz (98.09 kg)  01/15/15 215 lb 9.6 oz (97.796 kg)  12/23/14 211 lb 4 oz (95.822 kg)     Lab Results  Component Value Date   WBC 7.1 12/23/2014   HGB 13.6  12/23/2014   HCT 40.0 12/23/2014   PLT 285.0 12/23/2014   GLUCOSE 93 12/23/2014   CHOL 171 12/23/2014   TRIG 62.0 12/23/2014   HDL 49.00 12/23/2014   LDLCALC 110* 12/23/2014   ALT 28 12/23/2014   AST 19 12/23/2014   NA 137 12/23/2014   K 4.2 12/23/2014   CL 104 12/23/2014   CREATININE 0.64 12/23/2014   BUN 10 12/23/2014   CO2 27 12/23/2014   TSH 1.65 12/23/2014    Lab Results  Component Value Date   TSH 1.65 12/23/2014   Lab Results  Component Value Date   WBC 7.1 12/23/2014   HGB 13.6 12/23/2014   HCT 40.0 12/23/2014   MCV 89.2 12/23/2014   PLT 285.0 12/23/2014   Lab Results  Component Value Date   NA 137 12/23/2014   K 4.2 12/23/2014   CO2 27 12/23/2014   GLUCOSE 93 12/23/2014   BUN 10 12/23/2014   CREATININE 0.64 12/23/2014   BILITOT 0.5 12/23/2014   ALKPHOS 63 12/23/2014   AST 19 12/23/2014   ALT 28 12/23/2014   PROT 7.0 12/23/2014   ALBUMIN 4.1 12/23/2014   CALCIUM 9.1 12/23/2014   GFR 108.28 12/23/2014   Lab Results  Component Value Date   CHOL 171 12/23/2014   Lab Results  Component Value Date   HDL 49.00 12/23/2014   Lab Results  Component Value Date   LDLCALC 110* 12/23/2014   Lab Results  Component Value Date   TRIG 62.0 12/23/2014    Lab Results  Component Value Date   CHOLHDL 3 12/23/2014   No results found for: HGBA1C     Assessment & Plan:   Problem List Items Addressed This Visit    Overweight (Chronic)    Encouraged DASH diet, decrease po intake and increase exercise as tolerated. Needs 7-8 hours of sleep nightly. Avoid trans fats, eat small, frequent meals every 4-5 hours with lean proteins, complex carbs and healthy fats. Minimize simple carbs, GMO foods.      Asthma (Chronic)    Slight wheeze today but in general doing well.       ADD (attention deficit disorder) - Primary    Struggles with fatigue but medications help her stay focused at work and in her classes. Allowed refills      Relevant Medications   lisdexamfetamine (VYVANSE) 70 MG capsule   amphetamine-dextroamphetamine (ADDERALL) 10 MG tablet    Other Visit Diagnoses    Encounter for immunization           I have discontinued Ms. Liggins's amoxicillin. I have also changed her lisdexamfetamine and amphetamine-dextroamphetamine. Additionally, I am having her start on lisdexamfetamine, amphetamine-dextroamphetamine, lisdexamfetamine, and amphetamine-dextroamphetamine. Lastly, I am having her maintain her Butalbital-APAP-Caffeine, penciclovir, multivitamin, Acetaminophen (TYLENOL EXTRA STRENGTH PO), ibuprofen, valACYclovir, mupirocin ointment, albuterol, budesonide-formoterol, citalopram, and montelukast.  Meds ordered this encounter  Medications  . lisdexamfetamine (VYVANSE) 70 MG capsule    Sig: Take 1 capsule (70 mg total) by mouth daily. October 2016  RX    Dispense:  30 capsule    Refill:  0  . amphetamine-dextroamphetamine (ADDERALL) 10 MG tablet    Sig: Take 1 tablet (10 mg total) by mouth as directed. October 2016 rx    Dispense:  30 tablet    Refill:  0  . lisdexamfetamine (VYVANSE) 70 MG capsule    Sig: Take 1 capsule (70 mg total) by mouth daily. November 2016 rx    Dispense:  30 capsule    Refill:  0  .  amphetamine-dextroamphetamine (ADDERALL) 10 MG tablet    Sig: Take 1 tablet (10 mg total) by mouth daily with breakfast. November 2016 rx    Dispense:  30 tablet    Refill:  0  . lisdexamfetamine (VYVANSE) 70 MG capsule    Sig: Take 1 capsule (70 mg total) by mouth daily. December 2016 rx    Dispense:  30 capsule    Refill:  0  . amphetamine-dextroamphetamine (ADDERALL) 10 MG tablet    Sig: Take 1 tablet (10 mg total) by mouth daily with breakfast. December 2016 rx    Dispense:  30 tablet    Refill:  0     Penni Homans, MD

## 2015-03-30 NOTE — Assessment & Plan Note (Signed)
Slight wheeze today but in general doing well.

## 2015-03-30 NOTE — Assessment & Plan Note (Signed)
Encouraged DASH diet, decrease po intake and increase exercise as tolerated. Needs 7-8 hours of sleep nightly. Avoid trans fats, eat small, frequent meals every 4-5 hours with lean proteins, complex carbs and healthy fats. Minimize simple carbs, GMO foods. 

## 2015-03-30 NOTE — Assessment & Plan Note (Signed)
Struggles with fatigue but medications help her stay focused at work and in her classes. Allowed refills

## 2015-04-08 ENCOUNTER — Other Ambulatory Visit: Payer: Self-pay | Admitting: Family Medicine

## 2015-04-22 ENCOUNTER — Ambulatory Visit (INDEPENDENT_AMBULATORY_CARE_PROVIDER_SITE_OTHER): Payer: Managed Care, Other (non HMO) | Admitting: Psychology

## 2015-04-22 DIAGNOSIS — F4323 Adjustment disorder with mixed anxiety and depressed mood: Secondary | ICD-10-CM | POA: Diagnosis not present

## 2015-05-13 ENCOUNTER — Ambulatory Visit (INDEPENDENT_AMBULATORY_CARE_PROVIDER_SITE_OTHER): Payer: Managed Care, Other (non HMO) | Admitting: Psychology

## 2015-05-13 DIAGNOSIS — F4323 Adjustment disorder with mixed anxiety and depressed mood: Secondary | ICD-10-CM

## 2015-05-29 ENCOUNTER — Ambulatory Visit (INDEPENDENT_AMBULATORY_CARE_PROVIDER_SITE_OTHER): Payer: Managed Care, Other (non HMO) | Admitting: Psychology

## 2015-05-29 DIAGNOSIS — F4323 Adjustment disorder with mixed anxiety and depressed mood: Secondary | ICD-10-CM | POA: Diagnosis not present

## 2015-06-26 ENCOUNTER — Ambulatory Visit (INDEPENDENT_AMBULATORY_CARE_PROVIDER_SITE_OTHER): Payer: Managed Care, Other (non HMO) | Admitting: Psychology

## 2015-06-26 DIAGNOSIS — F4323 Adjustment disorder with mixed anxiety and depressed mood: Secondary | ICD-10-CM | POA: Diagnosis not present

## 2015-06-30 ENCOUNTER — Other Ambulatory Visit: Payer: Self-pay | Admitting: Family Medicine

## 2015-06-30 DIAGNOSIS — F988 Other specified behavioral and emotional disorders with onset usually occurring in childhood and adolescence: Secondary | ICD-10-CM

## 2015-06-30 MED ORDER — AMPHETAMINE-DEXTROAMPHETAMINE 10 MG PO TABS: 10.0000 mg | ORAL_TABLET | ORAL | Status: DC

## 2015-06-30 MED ORDER — AMPHETAMINE-DEXTROAMPHETAMINE 10 MG PO TABS: 10.0000 mg | ORAL_TABLET | Freq: Every day | ORAL | Status: DC

## 2015-06-30 MED ORDER — VALACYCLOVIR HCL 1 G PO TABS: 1000.0000 mg | ORAL_TABLET | Freq: Two times a day (BID) | ORAL | Status: DC

## 2015-06-30 MED ORDER — LISDEXAMFETAMINE DIMESYLATE 70 MG PO CAPS: 70.0000 mg | ORAL_CAPSULE | Freq: Every day | ORAL | Status: DC

## 2015-06-30 NOTE — Telephone Encounter (Signed)
Printed vyvanse and adderall for 3 months per PCP instructions and mychart request.

## 2015-07-31 ENCOUNTER — Ambulatory Visit (INDEPENDENT_AMBULATORY_CARE_PROVIDER_SITE_OTHER): Payer: Managed Care, Other (non HMO) | Admitting: Psychology

## 2015-07-31 DIAGNOSIS — F4323 Adjustment disorder with mixed anxiety and depressed mood: Secondary | ICD-10-CM | POA: Diagnosis not present

## 2015-08-28 ENCOUNTER — Ambulatory Visit (INDEPENDENT_AMBULATORY_CARE_PROVIDER_SITE_OTHER): Payer: Managed Care, Other (non HMO) | Admitting: Psychology

## 2015-08-28 DIAGNOSIS — F4323 Adjustment disorder with mixed anxiety and depressed mood: Secondary | ICD-10-CM | POA: Diagnosis not present

## 2015-09-21 ENCOUNTER — Other Ambulatory Visit: Payer: Self-pay

## 2015-09-21 DIAGNOSIS — Z1231 Encounter for screening mammogram for malignant neoplasm of breast: Secondary | ICD-10-CM

## 2015-09-24 ENCOUNTER — Encounter: Payer: Self-pay | Admitting: Family Medicine

## 2015-09-24 ENCOUNTER — Ambulatory Visit (INDEPENDENT_AMBULATORY_CARE_PROVIDER_SITE_OTHER): Payer: Managed Care, Other (non HMO) | Admitting: Family Medicine

## 2015-09-24 VITALS — BP 108/68 | HR 99 | Temp 98.2°F | Ht 65.0 in | Wt 217.0 lb

## 2015-09-24 DIAGNOSIS — K219 Gastro-esophageal reflux disease without esophagitis: Secondary | ICD-10-CM | POA: Diagnosis not present

## 2015-09-24 DIAGNOSIS — IMO0001 Reserved for inherently not codable concepts without codable children: Secondary | ICD-10-CM

## 2015-09-24 DIAGNOSIS — J4520 Mild intermittent asthma, uncomplicated: Secondary | ICD-10-CM

## 2015-09-24 DIAGNOSIS — G43009 Migraine without aura, not intractable, without status migrainosus: Secondary | ICD-10-CM

## 2015-09-24 DIAGNOSIS — F909 Attention-deficit hyperactivity disorder, unspecified type: Secondary | ICD-10-CM

## 2015-09-24 DIAGNOSIS — E663 Overweight: Secondary | ICD-10-CM | POA: Diagnosis not present

## 2015-09-24 DIAGNOSIS — E785 Hyperlipidemia, unspecified: Secondary | ICD-10-CM

## 2015-09-24 DIAGNOSIS — T7840XD Allergy, unspecified, subsequent encounter: Secondary | ICD-10-CM

## 2015-09-24 DIAGNOSIS — F988 Other specified behavioral and emotional disorders with onset usually occurring in childhood and adolescence: Secondary | ICD-10-CM

## 2015-09-24 MED ORDER — LISDEXAMFETAMINE DIMESYLATE 70 MG PO CAPS: 70.0000 mg | ORAL_CAPSULE | Freq: Every day | ORAL | Status: DC

## 2015-09-24 MED ORDER — ALBUTEROL SULFATE 0.63 MG/3ML IN NEBU: 1.0000 | INHALATION_SOLUTION | Freq: Four times a day (QID) | RESPIRATORY_TRACT | Status: DC | PRN

## 2015-09-24 MED ORDER — PENCICLOVIR 1 % EX CREA: TOPICAL_CREAM | CUTANEOUS | Status: DC

## 2015-09-24 MED ORDER — ALBUTEROL SULFATE HFA 108 (90 BASE) MCG/ACT IN AERS: 2.0000 | INHALATION_SPRAY | RESPIRATORY_TRACT | Status: DC | PRN

## 2015-09-24 MED ORDER — AMPHETAMINE-DEXTROAMPHETAMINE 10 MG PO TABS: 10.0000 mg | ORAL_TABLET | Freq: Every day | ORAL | Status: DC

## 2015-09-24 MED ORDER — AMPHETAMINE-DEXTROAMPHETAMINE 10 MG PO TABS: 10.0000 mg | ORAL_TABLET | ORAL | Status: DC

## 2015-09-24 NOTE — Progress Notes (Signed)
Pre visit review using our clinic review tool, if applicable. No additional management support is needed unless otherwise documented below in the visit note. 

## 2015-09-24 NOTE — Patient Instructions (Signed)
Fatigue  Fatigue is feeling tired all of the time, a lack of energy, or a lack of motivation. Occasional or mild fatigue is often a normal response to activity or life in general. However, long-lasting (chronic) or extreme fatigue may indicate an underlying medical condition.  HOME CARE INSTRUCTIONS   Watch your fatigue for any changes. The following actions may help to lessen any discomfort you are feeling:  · Talk to your health care provider about how much sleep you need each night. Try to get the required amount every night.  · Take medicines only as directed by your health care provider.  · Eat a healthy and nutritious diet. Ask your health care provider if you need help changing your diet.  · Drink enough fluid to keep your urine clear or pale yellow.  · Practice ways of relaxing, such as yoga, meditation, massage therapy, or acupuncture.  · Exercise regularly.    · Change situations that cause you stress. Try to keep your work and personal routine reasonable.  · Do not abuse illegal drugs.  · Limit alcohol intake to no more than 1 drink per day for nonpregnant women and 2 drinks per day for men. One drink equals 12 ounces of beer, 5 ounces of wine, or 1½ ounces of hard liquor.  · Take a multivitamin, if directed by your health care provider.  SEEK MEDICAL CARE IF:   · Your fatigue does not get better.  · You have a fever.    · You have unintentional weight loss or gain.  · You have headaches.    · You have difficulty:      Falling asleep.    Sleeping throughout the night.  · You feel angry, guilty, anxious, or sad.     · You are unable to have a bowel movement (constipation).    · You skin is dry.     · Your legs or another part of your body is swollen.    SEEK IMMEDIATE MEDICAL CARE IF:   · You feel confused.    · Your vision is blurry.  · You feel faint or pass out.    · You have a severe headache.    · You have severe abdominal, pelvic, or back pain.    · You have chest pain, shortness of breath, or an  irregular or fast heartbeat.    · You are unable to urinate or you urinate less than normal.    · You develop abnormal bleeding, such as bleeding from the rectum, vagina, nose, lungs, or nipples.  · You vomit blood.     · You have thoughts about harming yourself or committing suicide.    · You are worried that you might harm someone else.       This information is not intended to replace advice given to you by your health care provider. Make sure you discuss any questions you have with your health care provider.     Document Released: 04/03/2007 Document Revised: 06/27/2014 Document Reviewed: 10/08/2013  [employer] Interactive Patient Education ©2016 [employer]

## 2015-10-01 ENCOUNTER — Ambulatory Visit
Admission: RE | Admit: 2015-10-01 | Discharge: 2015-10-01 | Disposition: A | Discharge status: Home / Self Care | Payer: Managed Care, Other (non HMO) | Source: Ambulatory Visit

## 2015-10-01 DIAGNOSIS — Z1231 Encounter for screening mammogram for malignant neoplasm of breast: Secondary | ICD-10-CM

## 2015-10-07 ENCOUNTER — Ambulatory Visit: Payer: Managed Care, Other (non HMO) | Admitting: Psychology

## 2015-10-11 NOTE — Progress Notes (Signed)
Patient ID: Kristina Watts, female   DOB: May 04, 1971, 45 y.o.   MRN: [id number]   Subjective:    Patient ID: Kristina Watts, female    DOB: [birth year], 45 y.o.   MRN: [id number]  Chief Complaint  Patient presents with  . Follow-up    HPI Patient is in today for follow up. She continues to struggle with allergies and congestion. Has not had a recent asthma exacerbation. No recent illness, has had some fatigue. Headaches are manageable. Denies CP/palp/SOB/fevers/GI or GU c/o. Taking meds as prescribed  Past Medical History  Diagnosis Date  . Chicken pox as a child  . Pneumonia 1998  . Allergy     seasonal  . Asthma   . Depression   . Recurrent cold sores   . Depression with anxiety 04/18/2011  . 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 Overweight(278.02) 04/19/2011  . GDM (gestational diabetes mellitus) 04/19/2011  . History of pancreatitis 04/19/2011  . Allergic state 04/19/2011  . Preventative health care 04/19/2011  . Asthma 04/19/2011  . Tinea corporis 05/18/2011  . Fatigue 05/30/2011  . Otitis media of right ear 05/30/2011  . Dermatitis 05/18/2011  . Pharyngitis 08/23/2011  . Migraine 08/23/2011  . Pancreatitis, recurrent ([place]) 12/12/2011  . Reflux 12/12/2011  . LUQ pain 12/12/2011  . Hyperlipidemia 04/27/2012  . ADD (attention deficit disorder) 04/27/2012  . Acute bronchitis 11/19/2012  . Back pain 11/07/2013  . Cervical cancer screening 12/23/2014    Past Surgical History  Procedure Laterality Date  . Appendectomy  2007  . Cholecystectomy  2000  . Cesarean section  1-02 and 4-04    Family History  Problem Relation Age of Onset  . Thyroid disease Mother   . Hypertension Father   . Hyperlipidemia Father   . Heart disease Father   . Diabetes Father   . COPD Father     previous smoker  . Stroke Father   . Depression Father   . Restless legs syndrome Father   . Diabetes Brother   . Cancer Maternal Grandmother     Head and Neck  . Heart disease  Paternal Grandmother   . Hyperlipidemia Paternal Grandmother   . Hypertension Paternal Grandmother   . Diabetes Paternal Grandmother   . Depression Paternal Grandmother   . Stroke Paternal Grandmother   . Restless legs syndrome Paternal Grandmother   . Heart disease Paternal Grandfather   . Hyperlipidemia Paternal Grandfather   . Hypertension Paternal Grandfather   . Stroke Paternal Grandfather   . Diabetes Paternal Grandfather     Social History   Social History  . Marital Status: Married    Spouse Name: N/A  . Number of Children: N/A  . Years of Education: N/A   Occupational History  . Not on file.   Social History Main Topics  . Smoking status: Never Smoker   . Smokeless tobacco: Never Used  . Alcohol Use: Yes     Comment: rarely  . Drug Use: No  . Sexual Activity:    Partners: Male   Other Topics Concern  . Not on file   Social History Narrative    Outpatient Prescriptions Prior to Visit  Medication Sig Dispense Refill  . Acetaminophen (TYLENOL EXTRA STRENGTH PO) Take 2 tablets by mouth as needed.    . budesonide-formoterol (SYMBICORT) 160-4.5 MCG/ACT inhaler Inhale 2 puffs into the lungs 2 (two) times daily. 10.2 g 11  . Butalbital-APAP-Caffeine 50-300-40 MG CAPS Take 1 capsule by mouth 3 (three)  times daily as needed. HA 84 capsule 1  . citalopram (CELEXA) 10 MG tablet Take 1 tablet (10 mg total) by mouth 3 (three) times daily. TAKE 1 TABLET BY MOUTH IN THE MORNING AND TAKE 2 TABLETS BY MOUTH AT BED TIME. 270 tablet 1  . ibuprofen (ADVIL,MOTRIN) 200 MG tablet Take 200 mg by mouth every 6 (six) hours as needed.    . montelukast (SINGULAIR) 10 MG tablet TAKE 1 TABLET (10 MG TOTAL) BY MOUTH AT BEDTIME. 30 tablet 6  . Multiple Vitamin (MULTIVITAMIN) tablet Take 1 tablet by mouth daily.    . valACYclovir (VALTREX) 1000 MG tablet Take 1 tablet (1,000 mg total) by mouth 2 (two) times daily. 60 tablet 1  . albuterol (PROVENTIL HFA;VENTOLIN HFA) 108 (90 BASE) MCG/ACT  inhaler Inhale 2 puffs into the lungs every 4 (four) hours as needed for wheezing or shortness of breath. 18 g 11  . amphetamine-dextroamphetamine (ADDERALL) 10 MG tablet Take 1 tablet (10 mg total) by mouth as directed. January 2017 30 tablet 0  . amphetamine-dextroamphetamine (ADDERALL) 10 MG tablet Take 1 tablet (10 mg total) by mouth daily with breakfast. February 2017 30 tablet 0  . amphetamine-dextroamphetamine (ADDERALL) 10 MG tablet Take 1 tablet (10 mg total) by mouth daily with breakfast. March 2017 30 tablet 0  . lisdexamfetamine (VYVANSE) 70 MG capsule Take 1 capsule (70 mg total) by mouth daily. January 2017 30 capsule 0  . lisdexamfetamine (VYVANSE) 70 MG capsule Take 1 capsule (70 mg total) by mouth daily. February 2017 30 capsule 0  . lisdexamfetamine (VYVANSE) 70 MG capsule Take 1 capsule (70 mg total) by mouth daily. March 2017 30 capsule 0  . penciclovir (DENAVIR) 1 % cream Apply topically every 2 (two) hours. 1.5 g 1  . mupirocin ointment (BACTROBAN) 2 % Place 1 application into the nose 2 (two) times daily. Via clean qtip x 7 days (Patient not taking: Reported on 09/24/2015) 22 g 1  . montelukast (SINGULAIR) 10 MG tablet TAKE 1 TABLET (10 MG TOTAL) BY MOUTH AT BEDTIME. 30 tablet 3   No facility-administered medications prior to visit.    Allergies  Allergen Reactions  . Actifed [Triprolidine-Pse] Hives    Review of Systems  Constitutional: Positive for malaise/fatigue. Negative for fever.  HENT: Positive for congestion.   Eyes: Negative for blurred vision.  Respiratory: Negative for shortness of breath.   Cardiovascular: Negative for chest pain, palpitations and leg swelling.  Gastrointestinal: Negative for nausea, abdominal pain and blood in stool.  Genitourinary: Negative for dysuria and frequency.  Musculoskeletal: Negative for falls.  Skin: Negative for rash.  Neurological: Negative for dizziness, loss of consciousness and headaches.  Endo/Heme/Allergies: Negative  for environmental allergies.  Psychiatric/Behavioral: Negative for depression. The patient is not nervous/anxious.        Objective:    Physical Exam  Constitutional: She is oriented to person, place, and time. She appears well-developed and well-nourished. No distress.  HENT:  Head: Normocephalic and atraumatic.  Nose: Nose normal.  Eyes: Right eye exhibits no discharge. Left eye exhibits no discharge.  Neck: Normal range of motion. Neck supple.  Cardiovascular: Normal rate and regular rhythm.   No murmur heard. Pulmonary/Chest: Effort normal and breath sounds normal.  Abdominal: Soft. Bowel sounds are normal. There is no tenderness.  Musculoskeletal: She exhibits no edema.  Neurological: She is alert and oriented to person, place, and time.  Skin: Skin is warm and dry.  Psychiatric: She has a normal mood and affect.  Nursing note and vitals reviewed.   BP 108/68 mmHg  Pulse 99  Temp(Src) 98.2 F (36.8 C) (Oral)  Ht 5\' 5"  (1.651 m)  Wt 217 lb (98.431 kg)  BMI 36.11 kg/m2  SpO2 97% Wt Readings from Last 3 Encounters:  09/24/15 217 lb (98.431 kg)  03/26/15 216 lb 4 oz (98.09 kg)  01/15/15 215 lb 9.6 oz (97.796 kg)     Lab Results  Component Value Date   WBC 7.1 12/23/2014   HGB 13.6 12/23/2014   HCT 40.0 12/23/2014   PLT 285.0 12/23/2014   GLUCOSE 93 12/23/2014   CHOL 171 12/23/2014   TRIG 62.0 12/23/2014   HDL 49.00 12/23/2014   LDLCALC 110* 12/23/2014   ALT 28 12/23/2014   AST 19 12/23/2014   NA 137 12/23/2014   K 4.2 12/23/2014   CL 104 12/23/2014   CREATININE 0.64 12/23/2014   BUN 10 12/23/2014   CO2 27 12/23/2014   TSH 1.65 12/23/2014    Lab Results  Component Value Date   TSH 1.65 12/23/2014   Lab Results  Component Value Date   WBC 7.1 12/23/2014   HGB 13.6 12/23/2014   HCT 40.0 12/23/2014   MCV 89.2 12/23/2014   PLT 285.0 12/23/2014   Lab Results  Component Value Date   NA 137 12/23/2014   K 4.2 12/23/2014   CO2 27 12/23/2014    GLUCOSE 93 12/23/2014   BUN 10 12/23/2014   CREATININE 0.64 12/23/2014   BILITOT 0.5 12/23/2014   ALKPHOS 63 12/23/2014   AST 19 12/23/2014   ALT 28 12/23/2014   PROT 7.0 12/23/2014   ALBUMIN 4.1 12/23/2014   CALCIUM 9.1 12/23/2014   GFR 108.28 12/23/2014   Lab Results  Component Value Date   CHOL 171 12/23/2014   Lab Results  Component Value Date   HDL 49.00 12/23/2014   Lab Results  Component Value Date   LDLCALC 110* 12/23/2014   Lab Results  Component Value Date   TRIG 62.0 12/23/2014   Lab Results  Component Value Date   CHOLHDL 3 12/23/2014   No results found for: HGBA1C     Assessment & Plan:   Problem List Items Addressed This Visit    ADD (attention deficit disorder) - Primary   Relevant Medications   lisdexamfetamine (VYVANSE) 70 MG capsule   amphetamine-dextroamphetamine (ADDERALL) 10 MG tablet   Allergic state    Continue Singulair and other meds      Asthma (Chronic)    No flares lately, given refill on Albuterol      Relevant Medications   albuterol (PROVENTIL HFA;VENTOLIN HFA) 108 (90 Base) MCG/ACT inhaler   albuterol (ACCUNEB) 0.63 MG/3ML nebulizer solution   Hyperlipidemia    Encouraged heart healthy diet, increase exercise, avoid trans fats, consider a krill oil cap daily      Migraine    Encouraged increased hydration, 64 ounces of clear fluids daily. Minimize alcohol and caffeine. Eat small frequent meals with lean proteins and complex carbs. Avoid high and low blood sugars. Get adequate sleep, 7-8 hours a night. Needs exercise daily preferably in the morning.      Overweight (Chronic)    Encouraged DASH diet, decrease po intake and increase exercise as tolerated. Needs 7-8 hours of sleep nightly. Avoid trans fats, eat small, frequent meals every 4-5 hours with lean proteins, complex carbs and healthy fats. Minimize simple carbs      Reflux    Avoid offending foods, start probiotics. Do not eat  large meals in late evening and  consider raising head of bed.          I have changed Ms. Switzer's lisdexamfetamine, lisdexamfetamine, lisdexamfetamine, amphetamine-dextroamphetamine, amphetamine-dextroamphetamine, amphetamine-dextroamphetamine, and albuterol. I am also having her maintain her Butalbital-APAP-Caffeine, multivitamin, Acetaminophen (TYLENOL EXTRA STRENGTH PO), ibuprofen, mupirocin ointment, citalopram, montelukast, budesonide-formoterol, valACYclovir, penciclovir, and albuterol.  Meds ordered this encounter  Medications  . penciclovir (DENAVIR) 1 % cream    Sig: Apply topically every 2 (two) hours.    Dispense:  1.5 g    Refill:  1  . lisdexamfetamine (VYVANSE) 70 MG capsule    Sig: Take 1 capsule (70 mg total) by mouth daily. June  2017    Dispense:  30 capsule    Refill:  0  . lisdexamfetamine (VYVANSE) 70 MG capsule    Sig: Take 1 capsule (70 mg total) by mouth daily. May  2017    Dispense:  30 capsule    Refill:  0  . lisdexamfetamine (VYVANSE) 70 MG capsule    Sig: Take 1 capsule (70 mg total) by mouth daily. April  2017    Dispense:  30 capsule    Refill:  0  . amphetamine-dextroamphetamine (ADDERALL) 10 MG tablet    Sig: Take 1 tablet (10 mg total) by mouth daily with breakfast. June 2017    Dispense:  30 tablet    Refill:  0  . amphetamine-dextroamphetamine (ADDERALL) 10 MG tablet    Sig: Take 1 tablet (10 mg total) by mouth daily with breakfast. May  2017    Dispense:  30 tablet    Refill:  0  . amphetamine-dextroamphetamine (ADDERALL) 10 MG tablet    Sig: Take 1 tablet (10 mg total) by mouth as directed. April 2017    Dispense:  30 tablet    Refill:  0  . albuterol (PROVENTIL HFA;VENTOLIN HFA) 108 (90 Base) MCG/ACT inhaler    Sig: Inhale 2 puffs into the lungs every 4 (four) hours as needed for wheezing or shortness of breath.    Dispense:  18 g    Refill:  11  . DISCONTD: albuterol (ACCUNEB) 0.63 MG/3ML nebulizer solution    Sig: Take 1 ampule by nebulization every 6 (six) hours  as needed for wheezing.  Marland Kitchen albuterol (ACCUNEB) 0.63 MG/3ML nebulizer solution    Sig: Take 3 mLs (0.63 mg total) by nebulization every 6 (six) hours as needed for wheezing.    Dispense:  75 mL    Refill:  1     Gevork Ayyad, MD

## 2015-10-11 NOTE — Assessment & Plan Note (Signed)
No flares lately, given refill on Albuterol

## 2015-10-11 NOTE — Assessment & Plan Note (Signed)
Encouraged DASH diet, decrease po intake and increase exercise as tolerated. Needs 7-8 hours of sleep nightly. Avoid trans fats, eat small, frequent meals every 4-5 hours with lean proteins, complex carbs and healthy fats. Minimize simple carbs 

## 2015-10-11 NOTE — Assessment & Plan Note (Signed)
Encouraged increased hydration, 64 ounces of clear fluids daily. Minimize alcohol and caffeine. Eat small frequent meals with lean proteins and complex carbs. Avoid high and low blood sugars. Get adequate sleep, 7-8 hours a night. Needs exercise daily preferably in the morning.  

## 2015-10-11 NOTE — Assessment & Plan Note (Signed)
Continue Singulair and other meds

## 2015-10-11 NOTE — Assessment & Plan Note (Signed)
Avoid offending foods, start probiotics. Do not eat large meals in late evening and consider raising head of bed.  

## 2015-10-11 NOTE — Assessment & Plan Note (Signed)
Encouraged heart healthy diet, increase exercise, avoid trans fats, consider a krill oil cap daily 

## 2015-10-18 ENCOUNTER — Other Ambulatory Visit: Payer: Self-pay | Admitting: Family Medicine

## 2016-02-11 ENCOUNTER — Other Ambulatory Visit: Payer: Self-pay | Admitting: Family Medicine

## 2016-02-11 MED ORDER — MONTELUKAST SODIUM 10 MG PO TABS: 10.0000 mg | ORAL_TABLET | Freq: Every day | ORAL | 0 refills | Status: DC

## 2016-03-16 ENCOUNTER — Ambulatory Visit (INDEPENDENT_AMBULATORY_CARE_PROVIDER_SITE_OTHER): Payer: Managed Care, Other (non HMO) | Admitting: Psychology

## 2016-03-16 DIAGNOSIS — F4323 Adjustment disorder with mixed anxiety and depressed mood: Secondary | ICD-10-CM

## 2016-03-28 ENCOUNTER — Ambulatory Visit (INDEPENDENT_AMBULATORY_CARE_PROVIDER_SITE_OTHER): Payer: Managed Care, Other (non HMO) | Admitting: Family Medicine

## 2016-03-28 ENCOUNTER — Encounter: Payer: Self-pay | Admitting: Family Medicine

## 2016-03-28 VITALS — Wt 216.2 lb

## 2016-03-28 DIAGNOSIS — E785 Hyperlipidemia, unspecified: Secondary | ICD-10-CM | POA: Diagnosis not present

## 2016-03-28 DIAGNOSIS — E663 Overweight: Secondary | ICD-10-CM

## 2016-03-28 DIAGNOSIS — J209 Acute bronchitis, unspecified: Secondary | ICD-10-CM

## 2016-03-28 DIAGNOSIS — Z0000 Encounter for general adult medical examination without abnormal findings: Secondary | ICD-10-CM | POA: Diagnosis not present

## 2016-03-28 DIAGNOSIS — K219 Gastro-esophageal reflux disease without esophagitis: Secondary | ICD-10-CM

## 2016-03-28 DIAGNOSIS — F909 Attention-deficit hyperactivity disorder, unspecified type: Secondary | ICD-10-CM | POA: Diagnosis not present

## 2016-03-28 MED ORDER — AMPHETAMINE-DEXTROAMPHETAMINE 10 MG PO TABS: 10.0000 mg | ORAL_TABLET | ORAL | 0 refills | Status: DC

## 2016-03-28 MED ORDER — METHYLPREDNISOLONE 4 MG PO TABS: 4.0000 mg | ORAL_TABLET | Freq: Every day | ORAL | 1 refills | Status: DC

## 2016-03-28 MED ORDER — METHYLPREDNISOLONE ACETATE 40 MG/ML IJ SUSP
40.0000 mg | Freq: Once | INTRAMUSCULAR | Status: AC
  Administered 2016-03-28: 40 mg via INTRAMUSCULAR

## 2016-03-28 MED ORDER — VENLAFAXINE HCL ER 75 MG PO CP24: 75.0000 mg | ORAL_CAPSULE | Freq: Every day | ORAL | 1 refills | Status: DC

## 2016-03-28 MED ORDER — AMPHETAMINE-DEXTROAMPHETAMINE 10 MG PO TABS: 10.0000 mg | ORAL_TABLET | Freq: Every day | ORAL | 0 refills | Status: DC

## 2016-03-28 MED ORDER — CEFDINIR 300 MG PO CAPS: 300.0000 mg | ORAL_CAPSULE | Freq: Two times a day (BID) | ORAL | 0 refills | Status: DC

## 2016-03-28 MED ORDER — LISDEXAMFETAMINE DIMESYLATE 70 MG PO CAPS: 70.0000 mg | ORAL_CAPSULE | Freq: Every day | ORAL | 0 refills | Status: DC

## 2016-03-28 MED ORDER — VENLAFAXINE HCL ER 37.5 MG PO CP24: 37.5000 mg | ORAL_CAPSULE | Freq: Every day | ORAL | 0 refills | Status: DC

## 2016-03-28 NOTE — Progress Notes (Signed)
Pre visit review using our clinic review tool, if applicable. No additional management support is needed unless otherwise documented below in the visit note. 

## 2016-03-28 NOTE — Patient Instructions (Signed)
Start the Venlafaxine at 37.5 x 1 week drop the Citalopram to 20 mg daily x 5 days then 10 mg daily x 2 days then stop when increasing Venlafaxine.   Preventive Care for Adults, Female A healthy lifestyle and preventive care can promote health and wellness. Preventive health guidelines for women include the following key practices.  A routine yearly physical is a good way to check with your health care provider about your health and preventive screening. It is a chance to share any concerns and updates on your health and to receive a thorough exam.  Visit your dentist for a routine exam and preventive care every 6 months. Brush your teeth twice a day and floss once a day. Good oral hygiene prevents tooth decay and gum disease.  The frequency of eye exams is based on your age, health, family medical history, use of contact lenses, and other factors. Follow your health care provider's recommendations for frequency of eye exams.  Eat a healthy diet. Foods like vegetables, fruits, whole grains, low-fat dairy products, and lean protein foods contain the nutrients you need without too many calories. Decrease your intake of foods high in solid fats, added sugars, and salt. Eat the right amount of calories for you.Get information about a proper diet from your health care provider, if necessary.  Regular physical exercise is one of the most important things you can do for your health. Most adults should get at least 150 minutes of moderate-intensity exercise (any activity that increases your heart rate and causes you to sweat) each week. In addition, most adults need muscle-strengthening exercises on 2 or more days a week.  Maintain a healthy weight. The body mass index (BMI) is a screening tool to identify possible weight problems. It provides an estimate of body fat based on height and weight. Your health care provider can find your BMI and can help you achieve or maintain a healthy weight.For adults 20  years and older:  A BMI below 18.5 is considered underweight.  A BMI of 18.5 to 24.9 is normal.  A BMI of 25 to 29.9 is considered overweight.  A BMI of 30 and above is considered obese.  Maintain normal blood lipids and cholesterol levels by exercising and minimizing your intake of saturated fat. Eat a balanced diet with plenty of fruit and vegetables. Blood tests for lipids and cholesterol should begin at age 68 and be repeated every 5 years. If your lipid or cholesterol levels are high, you are over 50, or you are at high risk for heart disease, you may need your cholesterol levels checked more frequently.Ongoing high lipid and cholesterol levels should be treated with medicines if diet and exercise are not working.  If you smoke, find out from your health care provider how to quit. If you do not use tobacco, do not start.  Lung cancer screening is recommended for adults aged 50-80 years who are at high risk for developing lung cancer because of a history of smoking. A yearly low-dose CT scan of the lungs is recommended for people who have at least a 30-pack-year history of smoking and are a current smoker or have quit within the past 15 years. A pack year of smoking is smoking an average of 1 pack of cigarettes a day for 1 year (for example: 1 pack a day for 30 years or 2 packs a day for 15 years). Yearly screening should continue until the smoker has stopped smoking for at least 15 years. Yearly  screening should be stopped for people who develop a health problem that would prevent them from having lung cancer treatment.  If you are pregnant, do not drink alcohol. If you are breastfeeding, be very cautious about drinking alcohol. If you are not pregnant and choose to drink alcohol, do not have more than 1 drink per day. One drink is considered to be 12 ounces (355 mL) of beer, 5 ounces (148 mL) of wine, or 1.5 ounces (44 mL) of liquor.  Avoid use of street drugs. Do not share needles with  anyone. Ask for help if you need support or instructions about stopping the use of drugs.  High blood pressure causes heart disease and increases the risk of stroke. Your blood pressure should be checked at least every 1 to 2 years. Ongoing high blood pressure should be treated with medicines if weight loss and exercise do not work.  If you are 52-60 years old, ask your health care provider if you should take aspirin to prevent strokes.  Diabetes screening is done by taking a blood sample to check your blood glucose level after you have not eaten for a certain period of time (fasting). If you are not overweight and you do not have risk factors for diabetes, you should be screened once every 3 years starting at age 53. If you are overweight or obese and you are 70-61 years of age, you should be screened for diabetes every year as part of your cardiovascular risk assessment.  Breast cancer screening is essential preventive care for women. You should practice "breast self-awareness." This means understanding the normal appearance and feel of your breasts and may include breast self-examination. Any changes detected, no matter how small, should be reported to a health care provider. Women in their 63s and 30s should have a clinical breast exam (CBE) by a health care provider as part of a regular health exam every 1 to 3 years. After age 76, women should have a CBE every year. Starting at age 76, women should consider having a mammogram (breast X-ray test) every year. Women who have a family history of breast cancer should talk to their health care provider about genetic screening. Women at a high risk of breast cancer should talk to their health care providers about having an MRI and a mammogram every year.  Breast cancer gene (BRCA)-related cancer risk assessment is recommended for women who have family members with BRCA-related cancers. BRCA-related cancers include breast, ovarian, tubal, and peritoneal  cancers. Having family members with these cancers may be associated with an increased risk for harmful changes (mutations) in the breast cancer genes BRCA1 and BRCA2. Results of the assessment will determine the need for genetic counseling and BRCA1 and BRCA2 testing.  Your health care provider may recommend that you be screened regularly for cancer of the pelvic organs (ovaries, uterus, and vagina). This screening involves a pelvic examination, including checking for microscopic changes to the surface of your cervix (Pap test). You may be encouraged to have this screening done every 3 years, beginning at age 34.  For women ages 60-65, health care providers may recommend pelvic exams and Pap testing every 3 years, or they may recommend the Pap and pelvic exam, combined with testing for human papilloma virus (HPV), every 5 years. Some types of HPV increase your risk of cervical cancer. Testing for HPV may also be done on women of any age with unclear Pap test results.  Other health care providers may not recommend  any screening for nonpregnant women who are considered low risk for pelvic cancer and who do not have symptoms. Ask your health care provider if a screening pelvic exam is right for you.  If you have had past treatment for cervical cancer or a condition that could lead to cancer, you need Pap tests and screening for cancer for at least 20 years after your treatment. If Pap tests have been discontinued, your risk factors (such as having a new sexual partner) need to be reassessed to determine if screening should resume. Some women have medical problems that increase the chance of getting cervical cancer. In these cases, your health care provider may recommend more frequent screening and Pap tests.  Colorectal cancer can be detected and often prevented. Most routine colorectal cancer screening begins at the age of 26 years and continues through age 63 years. However, your health care provider may  recommend screening at an earlier age if you have risk factors for colon cancer. On a yearly basis, your health care provider may provide home test kits to check for hidden blood in the stool. Use of a small camera at the end of a tube, to directly examine the colon (sigmoidoscopy or colonoscopy), can detect the earliest forms of colorectal cancer. Talk to your health care provider about this at age 72, when routine screening begins. Direct exam of the colon should be repeated every 5-10 years through age 37 years, unless early forms of precancerous polyps or small growths are found.  People who are at an increased risk for hepatitis B should be screened for this virus. You are considered at high risk for hepatitis B if:  You were born in a country where hepatitis B occurs often. Talk with your health care provider about which countries are considered high risk.  Your parents were born in a high-risk country and you have not received a shot to protect against hepatitis B (hepatitis B vaccine).  You have HIV or AIDS.  You use needles to inject street drugs.  You live with, or have sex with, someone who has hepatitis B.  You get hemodialysis treatment.  You take certain medicines for conditions like cancer, organ transplantation, and autoimmune conditions.  Hepatitis C blood testing is recommended for all people born from [birth year] through 1965 and any individual with known risks for hepatitis C.  Practice safe sex. Use condoms and avoid high-risk sexual practices to reduce the spread of sexually transmitted infections (STIs). STIs include gonorrhea, chlamydia, syphilis, trichomonas, herpes, HPV, and human immunodeficiency virus (HIV). Herpes, HIV, and HPV are viral illnesses that have no cure. They can result in disability, cancer, and death.  You should be screened for sexually transmitted illnesses (STIs) including gonorrhea and chlamydia if:  You are sexually active and are younger than 24  years.  You are older than 24 years and your health care provider tells you that you are at risk for this type of infection.  Your sexual activity has changed since you were last screened and you are at an increased risk for chlamydia or gonorrhea. Ask your health care provider if you are at risk.  If you are at risk of being infected with HIV, it is recommended that you take a prescription medicine daily to prevent HIV infection. This is called preexposure prophylaxis (PrEP). You are considered at risk if:  You are sexually active and do not regularly use condoms or know the HIV status of your partner(s).  You take drugs by  injection.  You are sexually active with a partner who has HIV.  Talk with your health care provider about whether you are at high risk of being infected with HIV. If you choose to begin PrEP, you should first be tested for HIV. You should then be tested every 3 months for as long as you are taking PrEP.  Osteoporosis is a disease in which the bones lose minerals and strength with aging. This can result in serious bone fractures or breaks. The risk of osteoporosis can be identified using a bone density scan. Women ages 81 years and over and women at risk for fractures or osteoporosis should discuss screening with their health care providers. Ask your health care provider whether you should take a calcium supplement or vitamin D to reduce the rate of osteoporosis.  Menopause can be associated with physical symptoms and risks. Hormone replacement therapy is available to decrease symptoms and risks. You should talk to your health care provider about whether hormone replacement therapy is right for you.  Use sunscreen. Apply sunscreen liberally and repeatedly throughout the day. You should seek shade when your shadow is shorter than you. Protect yourself by wearing long sleeves, pants, a wide-brimmed hat, and sunglasses year round, whenever you are outdoors.  Once a month, do a  whole body skin exam, using a mirror to look at the skin on your back. Tell your health care provider of new moles, moles that have irregular borders, moles that are larger than a pencil eraser, or moles that have changed in shape or color.  Stay current with required vaccines (immunizations).  Influenza vaccine. All adults should be immunized every year.  Tetanus, diphtheria, and acellular pertussis (Td, Tdap) vaccine. Pregnant women should receive 1 dose of Tdap vaccine during each pregnancy. The dose should be obtained regardless of the length of time since the last dose. Immunization is preferred during the 27th-36th week of gestation. An adult who has not previously received Tdap or who does not know her vaccine status should receive 1 dose of Tdap. This initial dose should be followed by tetanus and diphtheria toxoids (Td) booster doses every 10 years. Adults with an unknown or incomplete history of completing a 3-dose immunization series with Td-containing vaccines should begin or complete a primary immunization series including a Tdap dose. Adults should receive a Td booster every 10 years.  Varicella vaccine. An adult without evidence of immunity to varicella should receive 2 doses or a second dose if she has previously received 1 dose. Pregnant females who do not have evidence of immunity should receive the first dose after pregnancy. This first dose should be obtained before leaving the health care facility. The second dose should be obtained 4-8 weeks after the first dose.  Human papillomavirus (HPV) vaccine. Females aged 13-26 years who have not received the vaccine previously should obtain the 3-dose series. The vaccine is not recommended for use in pregnant females. However, pregnancy testing is not needed before receiving a dose. If a female is found to be pregnant after receiving a dose, no treatment is needed. In that case, the remaining doses should be delayed until after the pregnancy.  Immunization is recommended for any person with an immunocompromised condition through the age of 80 years if she did not get any or all doses earlier. During the 3-dose series, the second dose should be obtained 4-8 weeks after the first dose. The third dose should be obtained 24 weeks after the first dose and 16 weeks  after the second dose.  Zoster vaccine. One dose is recommended for adults aged 45 years or older unless certain conditions are present.  Measles, mumps, and rubella (MMR) vaccine. Adults born before [birth year] generally are considered immune to measles and mumps. Adults born in [birth year] or later should have 1 or more doses of MMR vaccine unless there is a contraindication to the vaccine or there is laboratory evidence of immunity to each of the three diseases. A routine second dose of MMR vaccine should be obtained at least 28 days after the first dose for students attending postsecondary schools, health care workers, or international travelers. People who received inactivated measles vaccine or an unknown type of measles vaccine during 1963-1967 should receive 2 doses of MMR vaccine. People who received inactivated mumps vaccine or an unknown type of mumps vaccine before 1979 and are at high risk for mumps infection should consider immunization with 2 doses of MMR vaccine. For females of childbearing age, rubella immunity should be determined. If there is no evidence of immunity, females who are not pregnant should be vaccinated. If there is no evidence of immunity, females who are pregnant should delay immunization until after pregnancy. Unvaccinated health care workers born before [birth year] who lack laboratory evidence of measles, mumps, or rubella immunity or laboratory confirmation of disease should consider measles and mumps immunization with 2 doses of MMR vaccine or rubella immunization with 1 dose of MMR vaccine.  Pneumococcal 13-valent conjugate (PCV13) vaccine. When indicated, a person who is  uncertain of his immunization history and has no record of immunization should receive the PCV13 vaccine. All adults 6 years of age and older should receive this vaccine. An adult aged 75 years or older who has certain medical conditions and has not been previously immunized should receive 1 dose of PCV13 vaccine. This PCV13 should be followed with a dose of pneumococcal polysaccharide (PPSV23) vaccine. Adults who are at high risk for pneumococcal disease should obtain the PPSV23 vaccine at least 8 weeks after the dose of PCV13 vaccine. Adults older than 43 years of age who have normal immune system function should obtain the PPSV23 vaccine dose at least 1 year after the dose of PCV13 vaccine.  Pneumococcal polysaccharide (PPSV23) vaccine. When PCV13 is also indicated, PCV13 should be obtained first. All adults aged 1 years and older should be immunized. An adult younger than age 66 years who has certain medical conditions should be immunized. Any person who resides in a nursing home or long-term care facility should be immunized. An adult smoker should be immunized. People with an immunocompromised condition and certain other conditions should receive both PCV13 and PPSV23 vaccines. People with human immunodeficiency virus (HIV) infection should be immunized as soon as possible after diagnosis. Immunization during chemotherapy or radiation therapy should be avoided. Routine use of PPSV23 vaccine is not recommended for American Indians, [place] Natives, or people younger than 65 years unless there are medical conditions that require PPSV23 vaccine. When indicated, people who have unknown immunization and have no record of immunization should receive PPSV23 vaccine. One-time revaccination 5 years after the first dose of PPSV23 is recommended for people aged 19-64 years who have chronic kidney failure, nephrotic syndrome, asplenia, or immunocompromised conditions. People who received 1-2 doses of PPSV23 before age  61 years should receive another dose of PPSV23 vaccine at age 61 years or later if at least 5 years have passed since the previous dose. Doses of PPSV23 are not needed for people immunized with PPSV23  at or after age 33 years.  Meningococcal vaccine. Adults with asplenia or persistent complement component deficiencies should receive 2 doses of quadrivalent meningococcal conjugate (MenACWY-D) vaccine. The doses should be obtained at least 2 months apart. Microbiologists working with certain meningococcal bacteria, [employer], people at risk during an outbreak, and people who travel to or live in countries with a high rate of meningitis should be immunized. A first-year college student up through age 71 years who is living in a residence hall should receive a dose if she did not receive a dose on or after her 16th birthday. Adults who have certain high-risk conditions should receive one or more doses of vaccine.  Hepatitis A vaccine. Adults who wish to be protected from this disease, have certain high-risk conditions, work with hepatitis A-infected animals, work in hepatitis A research labs, or travel to or work in countries with a high rate of hepatitis A should be immunized. Adults who were previously unvaccinated and who anticipate close contact with an international adoptee during the first 60 days after arrival in the [place] from a country with a high rate of hepatitis A should be immunized.  Hepatitis B vaccine. Adults who wish to be protected from this disease, have certain high-risk conditions, may be exposed to blood or other infectious body fluids, are household contacts or sex partners of hepatitis B positive people, are clients or workers in certain care facilities, or travel to or work in countries with a high rate of hepatitis B should be immunized.  Haemophilus influenzae type b (Hib) vaccine. A previously unvaccinated person with asplenia or sickle cell disease or having a  scheduled splenectomy should receive 1 dose of Hib vaccine. Regardless of previous immunization, a recipient of a hematopoietic stem cell transplant should receive a 3-dose series 6-12 months after her successful transplant. Hib vaccine is not recommended for adults with HIV infection. Preventive Services / Frequency Ages 90 to 6 years  Blood pressure check.** / Every 3-5 years.  Lipid and cholesterol check.** / Every 5 years beginning at age 37.  Clinical breast exam.** / Every 3 years for women in their 66s and 78s.  BRCA-related cancer risk assessment.** / For women who have family members with a BRCA-related cancer (breast, ovarian, tubal, or peritoneal cancers).  Pap test.** / Every 2 years from ages 72 through 87. Every 3 years starting at age 86 through age 27 or 32 with a history of 3 consecutive normal Pap tests.  HPV screening.** / Every 3 years from ages 70 through ages 56 to 69 with a history of 3 consecutive normal Pap tests.  Hepatitis C blood test.** / For any individual with known risks for hepatitis C.  Skin self-exam. / Monthly.  Influenza vaccine. / Every year.  Tetanus, diphtheria, and acellular pertussis (Tdap, Td) vaccine.** / Consult your health care provider. Pregnant women should receive 1 dose of Tdap vaccine during each pregnancy. 1 dose of Td every 10 years.  Varicella vaccine.** / Consult your health care provider. Pregnant females who do not have evidence of immunity should receive the first dose after pregnancy.  HPV vaccine. / 3 doses over 6 months, if 58 and younger. The vaccine is not recommended for use in pregnant females. However, pregnancy testing is not needed before receiving a dose.  Measles, mumps, rubella (MMR) vaccine.** / You need at least 1 dose of MMR if you were born in 1957 or later. You may also need a 2nd dose. For females of  childbearing age, rubella immunity should be determined. If there is no evidence of immunity, females who are not  pregnant should be vaccinated. If there is no evidence of immunity, females who are pregnant should delay immunization until after pregnancy.  Pneumococcal 13-valent conjugate (PCV13) vaccine.** / Consult your health care provider.  Pneumococcal polysaccharide (PPSV23) vaccine.** / 1 to 2 doses if you smoke cigarettes or if you have certain conditions.  Meningococcal vaccine.** / 1 dose if you are age 80 to 44 years and a Market researcher living in a residence hall, or have one of several medical conditions, you need to get vaccinated against meningococcal disease. You may also need additional booster doses.  Hepatitis A vaccine.** / Consult your health care provider.  Hepatitis B vaccine.** / Consult your health care provider.  Haemophilus influenzae type b (Hib) vaccine.** / Consult your health care provider. Ages 21 to 33 years  Blood pressure check.** / Every year.  Lipid and cholesterol check.** / Every 5 years beginning at age 53 years.  Lung cancer screening. / Every year if you are aged 75-80 years and have a 30-pack-year history of smoking and currently smoke or have quit within the past 15 years. Yearly screening is stopped once you have quit smoking for at least 15 years or develop a health problem that would prevent you from having lung cancer treatment.  Clinical breast exam.** / Every year after age 65 years.  BRCA-related cancer risk assessment.** / For women who have family members with a BRCA-related cancer (breast, ovarian, tubal, or peritoneal cancers).  Mammogram.** / Every year beginning at age 4 years and continuing for as long as you are in good health. Consult with your health care provider.  Pap test.** / Every 3 years starting at age 34 years through age 60 or 9 years with a history of 3 consecutive normal Pap tests.  HPV screening.** / Every 3 years from ages 4 years through ages 10 to 16 years with a history of 3 consecutive normal Pap  tests.  Fecal occult blood test (FOBT) of stool. / Every year beginning at age 72 years and continuing until age 65 years. You may not need to do this test if you get a colonoscopy every 10 years.  Flexible sigmoidoscopy or colonoscopy.** / Every 5 years for a flexible sigmoidoscopy or every 10 years for a colonoscopy beginning at age 51 years and continuing until age 65 years.  Hepatitis C blood test.** / For all people born from [birth year] through 1963 and any individual with known risks for hepatitis C.  Skin self-exam. / Monthly.  Influenza vaccine. / Every year.  Tetanus, diphtheria, and acellular pertussis (Tdap/Td) vaccine.** / Consult your health care provider. Pregnant women should receive 1 dose of Tdap vaccine during each pregnancy. 1 dose of Td every 10 years.  Varicella vaccine.** / Consult your health care provider. Pregnant females who do not have evidence of immunity should receive the first dose after pregnancy.  Zoster vaccine.** / 1 dose for adults aged 34 years or older.  Measles, mumps, rubella (MMR) vaccine.** / You need at least 1 dose of MMR if you were born in 1957 or later. You may also need a second dose. For females of childbearing age, rubella immunity should be determined. If there is no evidence of immunity, females who are not pregnant should be vaccinated. If there is no evidence of immunity, females who are pregnant should delay immunization until after pregnancy.  Pneumococcal 13-valent conjugate (  PCV13) vaccine.** / Consult your health care provider.  Pneumococcal polysaccharide (PPSV23) vaccine.** / 1 to 2 doses if you smoke cigarettes or if you have certain conditions.  Meningococcal vaccine.** / Consult your health care provider.  Hepatitis A vaccine.** / Consult your health care provider.  Hepatitis B vaccine.** / Consult your health care provider.  Haemophilus influenzae type b (Hib) vaccine.** / Consult your health care provider. Ages 1 years and  over  Blood pressure check.** / Every year.  Lipid and cholesterol check.** / Every 5 years beginning at age 4 years.  Lung cancer screening. / Every year if you are aged 47-80 years and have a 30-pack-year history of smoking and currently smoke or have quit within the past 15 years. Yearly screening is stopped once you have quit smoking for at least 15 years or develop a health problem that would prevent you from having lung cancer treatment.  Clinical breast exam.** / Every year after age 79 years.  BRCA-related cancer risk assessment.** / For women who have family members with a BRCA-related cancer (breast, ovarian, tubal, or peritoneal cancers).  Mammogram.** / Every year beginning at age 44 years and continuing for as long as you are in good health. Consult with your health care provider.  Pap test.** / Every 3 years starting at age 38 years through age 66 or 83 years with 3 consecutive normal Pap tests. Testing can be stopped between 65 and 70 years with 3 consecutive normal Pap tests and no abnormal Pap or HPV tests in the past 10 years.  HPV screening.** / Every 3 years from ages 71 years through ages 47 or 11 years with a history of 3 consecutive normal Pap tests. Testing can be stopped between 65 and 70 years with 3 consecutive normal Pap tests and no abnormal Pap or HPV tests in the past 10 years.  Fecal occult blood test (FOBT) of stool. / Every year beginning at age 60 years and continuing until age 80 years. You may not need to do this test if you get a colonoscopy every 10 years.  Flexible sigmoidoscopy or colonoscopy.** / Every 5 years for a flexible sigmoidoscopy or every 10 years for a colonoscopy beginning at age 53 years and continuing until age 26 years.  Hepatitis C blood test.** / For all people born from [birth year] through 1963 and any individual with known risks for hepatitis C.  Osteoporosis screening.** / A one-time screening for women ages 28 years and over and women  at risk for fractures or osteoporosis.  Skin self-exam. / Monthly.  Influenza vaccine. / Every year.  Tetanus, diphtheria, and acellular pertussis (Tdap/Td) vaccine.** / 1 dose of Td every 10 years.  Varicella vaccine.** / Consult your health care provider.  Zoster vaccine.** / 1 dose for adults aged 61 years or older.  Pneumococcal 13-valent conjugate (PCV13) vaccine.** / Consult your health care provider.  Pneumococcal polysaccharide (PPSV23) vaccine.** / 1 dose for all adults aged 72 years and older.  Meningococcal vaccine.** / Consult your health care provider.  Hepatitis A vaccine.** / Consult your health care provider.  Hepatitis B vaccine.** / Consult your health care provider.  Haemophilus influenzae type b (Hib) vaccine.** / Consult your health care provider. ** Family history and personal history of risk and conditions may change your health care provider's recommendations.   This information is not intended to replace advice given to you by your health care provider. Make sure you discuss any questions you have with your health  care provider.   Document Released: 08/02/2001 Document Revised: 06/27/2014 Document Reviewed: 11/01/2010 Elsevier Interactive Patient Education [employer].

## 2016-03-28 NOTE — Assessment & Plan Note (Addendum)
Patient encouraged to maintain heart healthy diet, regular exercise, adequate sleep. Consider daily probiotics. Take medications as prescribed. Labs reviewed 

## 2016-03-28 NOTE — Progress Notes (Signed)
Patient ID: Kristina Watts, female   DOB: 1970-11-17, 45 y.o.   MRN: [id number]   Subjective:    Patient ID: Kristina Watts, female    DOB: 23-Jan-1971, 45 y.o.   MRN: [id number]  Chief Complaint  Patient presents with  . Annual Exam    HPI Patient is in today for an annual exam. 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 As a result she is very fatigued and depressed. She is not sleeping well and this week she has developed increased congestion with cough, wheezing and increased malaise. No suicidal ideation but endorses anhedonia. Denies CP/palp/SOB/HA/fevers/GI or GU c/o. Taking meds as prescribed  Past Medical History:  Diagnosis Date  . Acute bronchitis 11/19/2012  . ADD (attention deficit disorder) 04/27/2012  . Allergic state 04/19/2011  . Allergy    seasonal  . Asthma   . Asthma 04/19/2011  . Back pain 11/07/2013  . Cervical cancer screening 12/23/2014  . Chicken pox as a child  . Depression   . Depression with anxiety 04/18/2011  . Dermatitis 05/18/2011  . Fatigue 05/30/2011  . GDM (gestational diabetes mellitus) 04/19/2011  . History of gestational diabetes mellitus (GDM) 04/19/2011  . History of pancreatitis 04/19/2011  . Hyperlipidemia 04/27/2012  . LUQ pain 12/12/2011  . Migraine 08/23/2011  . Otitis media of right ear 05/30/2011  . Overweight(278.02) 04/19/2011  . Pancreatitis, recurrent ([place]) 12/12/2011  . Pharyngitis 08/23/2011  . Pneumonia 1998  . Preventative health care 04/19/2011  . Recurrent cold sores   . 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 Reflux 12/12/2011  . Tinea corporis 05/18/2011    Past Surgical History:  Procedure Laterality Date  . APPENDECTOMY  2007  . CESAREAN SECTION  1-02 and 4-04  . CHOLECYSTECTOMY  2000    Family History  Problem Relation Age of Onset  . Thyroid disease Mother   . Hypertension Father   . Hyperlipidemia Father   .  Heart disease Father   . Diabetes Father   . COPD Father     previous smoker  . Stroke Father   . Depression Father   . Restless legs syndrome Father   . Diabetes Brother   . Cancer Maternal Grandmother     Head and Neck  . Heart disease Paternal Grandmother   . Hyperlipidemia Paternal Grandmother   . Hypertension Paternal Grandmother   . Diabetes Paternal Grandmother   . Depression Paternal Grandmother   . Stroke Paternal Grandmother   . Restless legs syndrome Paternal Grandmother   . Heart disease Paternal Grandfather   . Hyperlipidemia Paternal Grandfather   . Hypertension Paternal Grandfather   . Stroke Paternal Grandfather   . Diabetes Paternal Grandfather     Social History   Social History  . Marital status: Married    Spouse name: N/A  . Number of children: N/A  . Years of education: N/A   Occupational History  . Not on file.   Social History Main Topics  . Smoking status: Never Smoker  . Smokeless tobacco: Never Used  . Alcohol use Yes     Comment: rarely  . Drug use: No  . Sexual activity: Yes    Partners: Male   Other Topics Concern  . Not on file   Social History Narrative  . No narrative on file    Outpatient Medications Prior to Visit  Medication Sig Dispense Refill  .  Acetaminophen (TYLENOL EXTRA STRENGTH PO) Take 2 tablets by mouth as needed.    Marland Kitchen albuterol (ACCUNEB) 0.63 MG/3ML nebulizer solution Take 3 mLs (0.63 mg total) by nebulization every 6 (six) hours as needed for wheezing. 75 mL 1  . albuterol (PROVENTIL HFA;VENTOLIN HFA) 108 (90 Base) MCG/ACT inhaler Inhale 2 puffs into the lungs every 4 (four) hours as needed for wheezing or shortness of breath. 18 g 11  . Butalbital-APAP-Caffeine 50-300-40 MG CAPS Take 1 capsule by mouth 3 (three) times daily as needed. HA 84 capsule 1  . ibuprofen (ADVIL,MOTRIN) 200 MG tablet Take 200 mg by mouth every 6 (six) hours as needed.    . montelukast (SINGULAIR) 10 MG tablet Take 1 tablet (10 mg total)  by mouth at bedtime. 90 tablet 0  . Multiple Vitamin (MULTIVITAMIN) tablet Take 1 tablet by mouth daily.    . mupirocin ointment (BACTROBAN) 2 % Place 1 application into the nose 2 (two) times daily. Via clean qtip x 7 days 22 g 1  . penciclovir (DENAVIR) 1 % cream Apply topically every 2 (two) hours. 1.5 g 1  . valACYclovir (VALTREX) 1000 MG tablet Take 1 tablet (1,000 mg total) by mouth 2 (two) times daily. 60 tablet 1  . amphetamine-dextroamphetamine (ADDERALL) 10 MG tablet Take 1 tablet (10 mg total) by mouth daily with breakfast. June 2017 30 tablet 0  . amphetamine-dextroamphetamine (ADDERALL) 10 MG tablet Take 1 tablet (10 mg total) by mouth daily with breakfast. May  2017 30 tablet 0  . amphetamine-dextroamphetamine (ADDERALL) 10 MG tablet Take 1 tablet (10 mg total) by mouth as directed. April 2017 30 tablet 0  . budesonide-formoterol (SYMBICORT) 160-4.5 MCG/ACT inhaler Inhale 2 puffs into the lungs 2 (two) times daily. 10.2 g 11  . citalopram (CELEXA) 10 MG tablet TAKE 1 TABLET BY MOUTH 3 TIMES DAILY, TAKE 1 IN AM, AND 2 AT BEDTIME 270 tablet 1  . lisdexamfetamine (VYVANSE) 70 MG capsule Take 1 capsule (70 mg total) by mouth daily. June  2017 30 capsule 0  . lisdexamfetamine (VYVANSE) 70 MG capsule Take 1 capsule (70 mg total) by mouth daily. May  2017 30 capsule 0  . lisdexamfetamine (VYVANSE) 70 MG capsule Take 1 capsule (70 mg total) by mouth daily. April  2017 30 capsule 0   No facility-administered medications prior to visit.     Allergies  Allergen Reactions  . Actifed [Triprolidine-Pse] Hives    Review of Systems  Constitutional: Negative for fever.  HENT: Positive for congestion.   Eyes: Negative for blurred vision.  Respiratory: Positive for wheezing. Negative for cough and shortness of breath.   Cardiovascular: Negative for chest pain and palpitations.  Gastrointestinal: Negative for vomiting.  Musculoskeletal: Negative for back pain.  Skin: Negative for rash.    Neurological: Negative for loss of consciousness and headaches.  Psychiatric/Behavioral: Positive for depression. Negative for hallucinations, substance abuse and suicidal ideas. The patient is not nervous/anxious.        Objective:    Physical Exam  Constitutional: She is oriented to person, place, and time. She appears well-developed and well-nourished. No distress.  HENT:  Head: Normocephalic and atraumatic.  Eyes: Conjunctivae are normal.  Neck: Normal range of motion. No thyromegaly present.  Cardiovascular: Normal rate and regular rhythm.   Pulmonary/Chest: Effort normal and breath sounds normal. She has no wheezes.  Abdominal: Soft. Bowel sounds are normal. There is no tenderness.  Musculoskeletal: Normal range of motion. She exhibits no edema or deformity.  Neurological:  She is alert and oriented to person, place, and time.  Skin: Skin is warm and dry. She is not diaphoretic.  Psychiatric: She has a normal mood and affect.    Wt 216 lb 3.2 oz (98.1 kg)   BMI 35.98 kg/m  Wt Readings from Last 3 Encounters:  03/28/16 216 lb 3.2 oz (98.1 kg)  09/24/15 217 lb (98.4 kg)  03/26/15 216 lb 4 oz (98.1 kg)     Lab Results  Component Value Date   WBC 8.7 03/28/2016   HGB 14.1 03/28/2016   HCT 41.5 03/28/2016   PLT 307.0 03/28/2016   GLUCOSE 90 03/28/2016   CHOL 178 03/28/2016   TRIG 108.0 03/28/2016   HDL 46.90 03/28/2016   LDLCALC 109 (H) 03/28/2016   ALT 26 03/28/2016   AST 21 03/28/2016   NA 140 03/28/2016   K 4.4 03/28/2016   CL 104 03/28/2016   CREATININE 0.84 03/28/2016   BUN 12 03/28/2016   CO2 28 03/28/2016   TSH 1.47 03/28/2016   HGBA1C 5.4 03/29/2016    Lab Results  Component Value Date   TSH 1.47 03/28/2016   Lab Results  Component Value Date   WBC 8.7 03/28/2016   HGB 14.1 03/28/2016   HCT 41.5 03/28/2016   MCV 89.9 03/28/2016   PLT 307.0 03/28/2016   Lab Results  Component Value Date   NA 140 03/28/2016   K 4.4 03/28/2016   CO2 28  03/28/2016   GLUCOSE 90 03/28/2016   BUN 12 03/28/2016   CREATININE 0.84 03/28/2016   BILITOT 0.4 03/28/2016   ALKPHOS 69 03/28/2016   AST 21 03/28/2016   ALT 26 03/28/2016   PROT 7.1 03/28/2016   ALBUMIN 4.3 03/28/2016   CALCIUM 9.3 03/28/2016   GFR 78.64 03/28/2016   Lab Results  Component Value Date   CHOL 178 03/28/2016   Lab Results  Component Value Date   HDL 46.90 03/28/2016   Lab Results  Component Value Date   LDLCALC 109 (H) 03/28/2016   Lab Results  Component Value Date   TRIG 108.0 03/28/2016   Lab Results  Component Value Date   CHOLHDL 4 03/28/2016   Lab Results  Component Value Date   HGBA1C 5.4 03/29/2016       Assessment & Plan:   Problem List Items Addressed This Visit    Overweight (Chronic)    Encouraged DASH diet, decrease po intake and increase exercise as tolerated. Needs 7-8 hours of sleep nightly. Avoid trans fats, eat small, frequent meals every 4-5 hours with lean proteins, complex carbs and healthy fats. Minimize simple carbs      Preventative health care - Primary    Patient encouraged to maintain heart healthy diet, regular exercise, adequate sleep. Consider daily probiotics. Take medications as prescribed. Labs reviewed      Relevant Orders   Lipid panel (Completed)   TSH (Completed)   Comp Met (CMET) (Completed)   CBC (Completed)   GERD (gastroesophageal reflux disease)    Avoid offending foods, start probiotics. Do not eat large meals in late evening and consider raising head of bed.       Hyperlipidemia   Relevant Orders   Lipid panel (Completed)   ADD (attention deficit disorder)    Allowed refills today      Relevant Medications   amphetamine-dextroamphetamine (ADDERALL) 10 MG tablet   lisdexamfetamine (VYVANSE) 70 MG capsule   Acute bronchitis    Given rx for Cefdinir and steroids.  Relevant Medications   methylPREDNISolone acetate (DEPO-MEDROL) injection 40 mg (Completed)    Other Visit Diagnoses     None.     I have discontinued Ms. Fifield's budesonide-formoterol and citalopram. I have also changed her amphetamine-dextroamphetamine, amphetamine-dextroamphetamine, amphetamine-dextroamphetamine, lisdexamfetamine, lisdexamfetamine, and lisdexamfetamine. Additionally, I am having her start on venlafaxine XR, venlafaxine XR, methylPREDNISolone, and cefdinir. Lastly, I am having her maintain her Butalbital-APAP-Caffeine, multivitamin, Acetaminophen (TYLENOL EXTRA STRENGTH PO), ibuprofen, mupirocin ointment, valACYclovir, penciclovir, albuterol, albuterol, and montelukast. We administered methylPREDNISolone acetate.  Meds ordered this encounter  Medications  . amphetamine-dextroamphetamine (ADDERALL) 10 MG tablet    Sig: Take 1 tablet (10 mg total) by mouth daily with breakfast. November2017    Dispense:  30 tablet    Refill:  0  . amphetamine-dextroamphetamine (ADDERALL) 10 MG tablet    Sig: Take 1 tablet (10 mg total) by mouth daily with breakfast. December  2017    Dispense:  30 tablet    Refill:  0  . amphetamine-dextroamphetamine (ADDERALL) 10 MG tablet    Sig: Take 1 tablet (10 mg total) by mouth as directed. January 2017    Dispense:  30 tablet    Refill:  0  . lisdexamfetamine (VYVANSE) 70 MG capsule    Sig: Take 1 capsule (70 mg total) by mouth daily. October 2017    Dispense:  30 capsule    Refill:  0  . lisdexamfetamine (VYVANSE) 70 MG capsule    Sig: Take 1 capsule (70 mg total) by mouth daily. November  2017    Dispense:  30 capsule    Refill:  0  . lisdexamfetamine (VYVANSE) 70 MG capsule    Sig: Take 1 capsule (70 mg total) by mouth daily. December 2017    Dispense:  30 capsule    Refill:  0  . venlafaxine XR (EFFEXOR XR) 37.5 MG 24 hr capsule    Sig: Take 1 capsule (37.5 mg total) by mouth daily.    Dispense:  7 capsule    Refill:  0  . venlafaxine XR (EFFEXOR XR) 75 MG 24 hr capsule    Sig: Take 1 capsule (75 mg total) by mouth daily.    Dispense:  30 capsule     Refill:  1  . methylPREDNISolone (MEDROL) 4 MG tablet    Sig: Take 1 tablet (4 mg total) by mouth daily. 6 tabs po qd x1d, then 5 tabs po qdx1d, then 4 tabs po qdx1d, then 3 tabs po qd x1d, then 2 tabs po qd x1d, the 1 tabs po qd x1d.    Dispense:  21 tablet    Refill:  1  . methylPREDNISolone acetate (DEPO-MEDROL) injection 40 mg  . cefdinir (OMNICEF) 300 MG capsule    Sig: Take 1 capsule (300 mg total) by mouth 2 (two) times daily.    Dispense:  20 capsule    Refill:  0     Penni Homans, MD

## 2016-03-28 NOTE — Assessment & Plan Note (Signed)
Avoid offending foods, start probiotics. Do not eat large meals in late evening and consider raising head of bed.  

## 2016-03-28 NOTE — Assessment & Plan Note (Signed)
Continues to stay in counseling and has been struggling with worsening depression will titrate down Citalopram and start Venlafaxine ER at 37.5 to 75 mg daily.

## 2016-03-29 ENCOUNTER — Other Ambulatory Visit (INDEPENDENT_AMBULATORY_CARE_PROVIDER_SITE_OTHER): Payer: Managed Care, Other (non HMO)

## 2016-03-29 DIAGNOSIS — R739 Hyperglycemia, unspecified: Secondary | ICD-10-CM

## 2016-03-29 LAB — HEMOGLOBIN A1C: HEMOGLOBIN A1C: 5.4 % (ref 4.6–6.5)

## 2016-03-29 LAB — LIPID PANEL
Cholesterol: 178 mg/dL (ref 0–200)
HDL: 46.9 mg/dL (ref >39.00)
LDL Cholesterol: 109 mg/dL — ABNORMAL HIGH (ref 0–99)
NONHDL: 130.93
TRIGLYCERIDES: 108 mg/dL (ref 0.0–149.0)
Total CHOL/HDL Ratio: 4
VLDL: 21.6 mg/dL (ref 0.0–40.0)

## 2016-03-29 LAB — CBC
HCT: 41.5 % (ref 36.0–46.0)
Hemoglobin: 14.1 g/dL (ref 12.0–15.0)
MCHC: 34 g/dL (ref 30.0–36.0)
MCV: 89.9 fl (ref 78.0–100.0)
Platelets: 307 10*3/uL (ref 150.0–400.0)
RBC: 4.62 Mil/uL (ref 3.87–5.11)
RDW: 13.1 % (ref 11.5–15.5)
WBC: 8.7 10*3/uL (ref 4.0–10.5)

## 2016-03-29 LAB — COMPREHENSIVE METABOLIC PANEL
ALBUMIN: 4.3 g/dL (ref 3.5–5.2)
ALT: 26 U/L (ref 0–35)
AST: 21 U/L (ref 0–37)
Alkaline Phosphatase: 69 U/L (ref 39–117)
BILIRUBIN TOTAL: 0.4 mg/dL (ref 0.2–1.2)
BUN: 12 mg/dL (ref 6–23)
CALCIUM: 9.3 mg/dL (ref 8.4–10.5)
CO2: 28 meq/L (ref 19–32)
CREATININE: 0.84 mg/dL (ref 0.40–1.20)
Chloride: 104 mEq/L (ref 96–112)
GFR: 78.64 mL/min (ref >60.00)
Glucose, Bld: 90 mg/dL (ref 70–99)
Potassium: 4.4 mEq/L (ref 3.5–5.1)
Sodium: 140 mEq/L (ref 135–145)
Total Protein: 7.1 g/dL (ref 6.0–8.3)

## 2016-03-29 LAB — TSH: TSH: 1.47 u[IU]/mL (ref 0.35–4.50)

## 2016-03-30 NOTE — Assessment & Plan Note (Signed)
Allowed refills today

## 2016-03-30 NOTE — Assessment & Plan Note (Signed)
Encouraged DASH diet, decrease po intake and increase exercise as tolerated. Needs 7-8 hours of sleep nightly. Avoid trans fats, eat small, frequent meals every 4-5 hours with lean proteins, complex carbs and healthy fats. Minimize simple carbs 

## 2016-03-30 NOTE — Assessment & Plan Note (Signed)
Given rx for Cefdinir and steroids.

## 2016-04-13 ENCOUNTER — Other Ambulatory Visit: Payer: Self-pay | Admitting: Family Medicine

## 2016-04-14 ENCOUNTER — Other Ambulatory Visit: Payer: Self-pay | Admitting: Family Medicine

## 2016-04-25 ENCOUNTER — Ambulatory Visit (INDEPENDENT_AMBULATORY_CARE_PROVIDER_SITE_OTHER): Payer: Managed Care, Other (non HMO) | Admitting: Psychology

## 2016-04-25 DIAGNOSIS — F4323 Adjustment disorder with mixed anxiety and depressed mood: Secondary | ICD-10-CM | POA: Diagnosis not present

## 2016-05-22 ENCOUNTER — Other Ambulatory Visit: Payer: Self-pay | Admitting: Family Medicine

## 2016-05-27 ENCOUNTER — Ambulatory Visit: Payer: Self-pay | Admitting: Psychology

## 2016-06-10 ENCOUNTER — Ambulatory Visit (INDEPENDENT_AMBULATORY_CARE_PROVIDER_SITE_OTHER): Payer: Managed Care, Other (non HMO) | Admitting: Psychology

## 2016-06-10 DIAGNOSIS — F4323 Adjustment disorder with mixed anxiety and depressed mood: Secondary | ICD-10-CM

## 2016-06-28 ENCOUNTER — Ambulatory Visit (INDEPENDENT_AMBULATORY_CARE_PROVIDER_SITE_OTHER): Payer: Managed Care, Other (non HMO) | Admitting: Family Medicine

## 2016-06-28 ENCOUNTER — Encounter: Payer: Self-pay | Admitting: Family Medicine

## 2016-06-28 DIAGNOSIS — F418 Other specified anxiety disorders: Secondary | ICD-10-CM

## 2016-06-28 DIAGNOSIS — F988 Other specified behavioral and emotional disorders with onset usually occurring in childhood and adolescence: Secondary | ICD-10-CM

## 2016-06-28 DIAGNOSIS — J45909 Unspecified asthma, uncomplicated: Secondary | ICD-10-CM | POA: Diagnosis not present

## 2016-06-28 DIAGNOSIS — E663 Overweight: Secondary | ICD-10-CM

## 2016-06-28 MED ORDER — LISDEXAMFETAMINE DIMESYLATE 70 MG PO CAPS: 70.0000 mg | ORAL_CAPSULE | Freq: Every day | ORAL | 0 refills | Status: DC

## 2016-06-28 MED ORDER — AMPHETAMINE-DEXTROAMPHETAMINE 10 MG PO TABS: 10.0000 mg | ORAL_TABLET | Freq: Every day | ORAL | 0 refills | Status: DC

## 2016-06-28 NOTE — Patient Instructions (Signed)

## 2016-06-28 NOTE — Progress Notes (Signed)
Pre visit review using our clinic review tool, if applicable. No additional management support is needed unless otherwise documented below in the visit note. 

## 2016-07-04 NOTE — Assessment & Plan Note (Signed)
maintain DASH diet, decrease po intake and increase exercise as tolerated. Needs 7-8 hours of sleep nightly. Avoid trans fats, eat small, frequent meals every 4-5 hours with lean proteins, complex carbs and healthy fats. Minimize simple carbs

## 2016-07-04 NOTE — Assessment & Plan Note (Addendum)
She is doing well on current meds but has taken a leave from work secondary to getting their house ready for her husband's move to a wheelchair. He has progressive MS and continues to decline. Their son is also struggling with all of the changes so she is helping to get him to counseling and spend time with him. Forms completed today, 25 minutes of a 30 minute visit spent on counseling

## 2016-07-04 NOTE — Assessment & Plan Note (Addendum)
May continue current meds. No new concerns. May continue to refill

## 2016-07-04 NOTE — Assessment & Plan Note (Signed)
No recent exacerbations. Continue current meds 

## 2016-07-04 NOTE — Progress Notes (Signed)
Patient ID: Kristina Watts, female   DOB: 1971-05-06, 46 y.o.   MRN: [id number]   Subjective:    Patient ID: Kristina Watts, female    DOB: [birth year], 46 y.o.   MRN: [id number]  No chief complaint on file.   HPI Patient is in today for follow up and to complete forms for a leave from work. She is doing well on current meds but has taken a leave from work secondary to getting their house ready for her husband's move to a wheelchair. He has progressive MS and continues to decline. Their son is also struggling with all of the changes so she is helping to get him to counseling and spend time with him. She is struggling with ongoing fatigue but no recent acute illness. Denies CP/palp/SOB/HA/congestion/fevers/GI or GU c/o. Taking meds as prescribed  Past Medical History:  Diagnosis Date  . Acute bronchitis 11/19/2012  . ADD (attention deficit disorder) 04/27/2012  . Allergic state 04/19/2011  . Allergy    seasonal  . Asthma   . Asthma 04/19/2011  . Back pain 11/07/2013  . Cervical cancer screening 12/23/2014  . Chicken pox as a child  . Depression   . Depression with anxiety 04/18/2011  . Dermatitis 05/18/2011  . Fatigue 05/30/2011  . GDM (gestational diabetes mellitus) 04/19/2011  . History of gestational diabetes mellitus (GDM) 04/19/2011  . History of pancreatitis 04/19/2011  . Hyperlipidemia 04/27/2012  . LUQ pain 12/12/2011  . Migraine 08/23/2011  . Otitis media of right ear 05/30/2011  . Overweight(278.02) 04/19/2011  . Pancreatitis, recurrent ([place]) 12/12/2011  . Pharyngitis 08/23/2011  . Pneumonia 1998  . Preventative health care 04/19/2011  . Recurrent cold sores   . 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 Reflux 12/12/2011  . Tinea corporis 05/18/2011    Past Surgical History:  Procedure Laterality Date  . APPENDECTOMY  2007  . CESAREAN SECTION  1-02 and 4-04  . CHOLECYSTECTOMY  2000    Family History  Problem Relation Age of Onset  . Thyroid disease Mother     . Hypertension Father   . Hyperlipidemia Father   . Heart disease Father   . Diabetes Father   . COPD Father     previous smoker  . Stroke Father   . Depression Father   . Restless legs syndrome Father   . Diabetes Brother   . Cancer Maternal Grandmother     Head and Neck  . Heart disease Paternal Grandmother   . Hyperlipidemia Paternal Grandmother   . Hypertension Paternal Grandmother   . Diabetes Paternal Grandmother   . Depression Paternal Grandmother   . Stroke Paternal Grandmother   . Restless legs syndrome Paternal Grandmother   . Heart disease Paternal Grandfather   . Hyperlipidemia Paternal Grandfather   . Hypertension Paternal Grandfather   . Stroke Paternal Grandfather   . Diabetes Paternal Grandfather     Social History   Social History  . Marital status: Married    Spouse name: N/A  . Number of children: N/A  . Years of education: N/A   Occupational History  . Not on file.   Social History Main Topics  . Smoking status: Never Smoker  . Smokeless tobacco: Never Used  . Alcohol use Yes     Comment: rarely  . Drug use: No  . Sexual activity: Yes    Partners: Male   Other Topics Concern  . Not on file   Social History Narrative  . No narrative  on file    Outpatient Medications Prior to Visit  Medication Sig Dispense Refill  . Acetaminophen (TYLENOL EXTRA STRENGTH PO) Take 2 tablets by mouth as needed.    Marland Kitchen albuterol (ACCUNEB) 0.63 MG/3ML nebulizer solution TAKE 3 MLS (0.63 MG TOTAL) BY NEBULIZATION EVERY 6 (SIX) HOURS AS NEEDED FOR WHEEZING. 75 mL 1  . albuterol (PROVENTIL HFA;VENTOLIN HFA) 108 (90 Base) MCG/ACT inhaler Inhale 2 puffs into the lungs every 4 (four) hours as needed for wheezing or shortness of breath. 18 g 11  . Butalbital-APAP-Caffeine 50-300-40 MG CAPS Take 1 capsule by mouth 3 (three) times daily as needed. HA 84 capsule 1  . ibuprofen (ADVIL,MOTRIN) 200 MG tablet Take 200 mg by mouth every 6 (six) hours as needed.    .  montelukast (SINGULAIR) 10 MG tablet Take 1 tablet (10 mg total) by mouth at bedtime. 90 tablet 0  . Multiple Vitamin (MULTIVITAMIN) tablet Take 1 tablet by mouth daily.    . mupirocin ointment (BACTROBAN) 2 % Place 1 application into the nose 2 (two) times daily. Via clean qtip x 7 days 22 g 1  . penciclovir (DENAVIR) 1 % cream Apply topically every 2 (two) hours. 1.5 g 1  . valACYclovir (VALTREX) 1000 MG tablet Take 1 tablet (1,000 mg total) by mouth 2 (two) times daily. 60 tablet 1  . venlafaxine XR (EFFEXOR XR) 37.5 MG 24 hr capsule Take 1 capsule (37.5 mg total) by mouth daily. 7 capsule 0  . venlafaxine XR (EFFEXOR-XR) 75 MG 24 hr capsule TAKE ONE CAPSULE BY MOUTH EVERY DAY 30 capsule 1  . amphetamine-dextroamphetamine (ADDERALL) 10 MG tablet Take 1 tablet (10 mg total) by mouth daily with breakfast. November2017 30 tablet 0  . amphetamine-dextroamphetamine (ADDERALL) 10 MG tablet Take 1 tablet (10 mg total) by mouth daily with breakfast. December  2017 30 tablet 0  . amphetamine-dextroamphetamine (ADDERALL) 10 MG tablet Take 1 tablet (10 mg total) by mouth as directed. January 2017 30 tablet 0  . lisdexamfetamine (VYVANSE) 70 MG capsule Take 1 capsule (70 mg total) by mouth daily. October 2017 30 capsule 0  . lisdexamfetamine (VYVANSE) 70 MG capsule Take 1 capsule (70 mg total) by mouth daily. November  2017 30 capsule 0  . lisdexamfetamine (VYVANSE) 70 MG capsule Take 1 capsule (70 mg total) by mouth daily. December 2017 30 capsule 0  . cefdinir (OMNICEF) 300 MG capsule Take 1 capsule (300 mg total) by mouth 2 (two) times daily. (Patient not taking: Reported on 06/28/2016) 20 capsule 0  . methylPREDNISolone (MEDROL) 4 MG tablet Take 1 tablet (4 mg total) by mouth daily. 6 tabs po qd x1d, then 5 tabs po qdx1d, then 4 tabs po qdx1d, then 3 tabs po qd x1d, then 2 tabs po qd x1d, the 1 tabs po qd x1d. (Patient not taking: Reported on 06/28/2016) 21 tablet 1   No facility-administered medications  prior to visit.     Allergies  Allergen Reactions  . Actifed [Triprolidine-Pse] Hives    Review of Systems  Constitutional: Positive for malaise/fatigue. Negative for fever.  HENT: Negative for congestion.   Eyes: Negative for blurred vision.  Respiratory: Negative for shortness of breath.   Cardiovascular: Negative for chest pain, palpitations and leg swelling.  Gastrointestinal: Negative for abdominal pain, blood in stool and nausea.  Genitourinary: Negative for dysuria and frequency.  Musculoskeletal: Negative for falls.  Skin: Negative for rash.  Neurological: Negative for dizziness, loss of consciousness and headaches.  Endo/Heme/Allergies: Negative for environmental  allergies.  Psychiatric/Behavioral: Negative for depression. The patient is not nervous/anxious.        Objective:    Physical Exam  Constitutional: She is oriented to person, place, and time. She appears well-developed and well-nourished. No distress.  HENT:  Head: Normocephalic and atraumatic.  Nose: Nose normal.  Eyes: Right eye exhibits no discharge. Left eye exhibits no discharge.  Neck: Normal range of motion. Neck supple.  Cardiovascular: Normal rate and regular rhythm.   No murmur heard. Pulmonary/Chest: Effort normal and breath sounds normal.  Abdominal: Soft. Bowel sounds are normal. There is no tenderness.  Musculoskeletal: She exhibits no edema.  Neurological: She is alert and oriented to person, place, and time.  Skin: Skin is warm and dry.  Psychiatric: She has a normal mood and affect.  Nursing note and vitals reviewed.   BP 112/68 (BP Location: Left Arm, Patient Position: Sitting, Cuff Size: Large)   Pulse 85   Temp 98.3 F (36.8 C) (Oral)   Wt 223 lb 3.2 oz (101.2 kg)   LMP 06/20/2016 (Approximate)   SpO2 96% Comment: RA  BMI 37.14 kg/m  Wt Readings from Last 3 Encounters:  06/28/16 223 lb 3.2 oz (101.2 kg)  03/28/16 216 lb 3.2 oz (98.1 kg)  09/24/15 217 lb (98.4 kg)      Lab Results  Component Value Date   WBC 8.7 03/28/2016   HGB 14.1 03/28/2016   HCT 41.5 03/28/2016   PLT 307.0 03/28/2016   GLUCOSE 90 03/28/2016   CHOL 178 03/28/2016   TRIG 108.0 03/28/2016   HDL 46.90 03/28/2016   LDLCALC 109 (H) 03/28/2016   ALT 26 03/28/2016   AST 21 03/28/2016   NA 140 03/28/2016   K 4.4 03/28/2016   CL 104 03/28/2016   CREATININE 0.84 03/28/2016   BUN 12 03/28/2016   CO2 28 03/28/2016   TSH 1.47 03/28/2016   HGBA1C 5.4 03/29/2016    Lab Results  Component Value Date   TSH 1.47 03/28/2016   Lab Results  Component Value Date   WBC 8.7 03/28/2016   HGB 14.1 03/28/2016   HCT 41.5 03/28/2016   MCV 89.9 03/28/2016   PLT 307.0 03/28/2016   Lab Results  Component Value Date   NA 140 03/28/2016   K 4.4 03/28/2016   CO2 28 03/28/2016   GLUCOSE 90 03/28/2016   BUN 12 03/28/2016   CREATININE 0.84 03/28/2016   BILITOT 0.4 03/28/2016   ALKPHOS 69 03/28/2016   AST 21 03/28/2016   ALT 26 03/28/2016   PROT 7.1 03/28/2016   ALBUMIN 4.3 03/28/2016   CALCIUM 9.3 03/28/2016   GFR 78.64 03/28/2016   Lab Results  Component Value Date   CHOL 178 03/28/2016   Lab Results  Component Value Date   HDL 46.90 03/28/2016   Lab Results  Component Value Date   LDLCALC 109 (H) 03/28/2016   Lab Results  Component Value Date   TRIG 108.0 03/28/2016   Lab Results  Component Value Date   CHOLHDL 4 03/28/2016   Lab Results  Component Value Date   HGBA1C 5.4 03/29/2016       Assessment & Plan:   Problem List Items Addressed This Visit    Depression with anxiety (Chronic)    She is doing well on current meds but has taken a leave from work secondary to getting their house ready for her husband's move to a wheelchair. He has progressive MS and continues to decline. Their son is also struggling with  all of the changes so she is helping to get him to counseling and spend time with him. Forms completed today, 25 minutes of a 30 minute visit spent  on counseling      Overweight (Chronic)    maintain DASH diet, decrease po intake and increase exercise as tolerated. Needs 7-8 hours of sleep nightly. Avoid trans fats, eat small, frequent meals every 4-5 hours with lean proteins, complex carbs and healthy fats. Minimize simple carbs      Asthma (Chronic)    No recent exacerbations. Continue current meds.       ADD (attention deficit disorder)    May continue current meds. No new concerns. May continue to refill         I have discontinued Ms. Lenn's amphetamine-dextroamphetamine, amphetamine-dextroamphetamine, amphetamine-dextroamphetamine, lisdexamfetamine, lisdexamfetamine, lisdexamfetamine, methylPREDNISolone, cefdinir, lisdexamfetamine, lisdexamfetamine, lisdexamfetamine, and amphetamine-dextroamphetamine. I have also changed her amphetamine-dextroamphetamine. Additionally, I am having her maintain her Butalbital-APAP-Caffeine, multivitamin, Acetaminophen (TYLENOL EXTRA STRENGTH PO), ibuprofen, mupirocin ointment, valACYclovir, penciclovir, albuterol, montelukast, venlafaxine XR, albuterol, and venlafaxine XR.  Meds ordered this encounter  Medications  . DISCONTD: lisdexamfetamine (VYVANSE) 70 MG capsule    Sig: Take 1 capsule (70 mg total) by mouth daily. October 2017    Dispense:  30 capsule    Refill:  0  . DISCONTD: lisdexamfetamine (VYVANSE) 70 MG capsule    Sig: Take 1 capsule (70 mg total) by mouth daily. Fill on or after 08/17/2016.    Dispense:  30 capsule    Refill:  0  . DISCONTD: lisdexamfetamine (VYVANSE) 70 MG capsule    Sig: Take 1 capsule (70 mg total) by mouth daily. Fill on or after 09/16/2016.    Dispense:  30 capsule    Refill:  0  . DISCONTD: amphetamine-dextroamphetamine (ADDERALL) 10 MG tablet    Sig: Take 1 tablet (10 mg total) by mouth daily with breakfast. November2017    Dispense:  30 tablet    Refill:  0  . DISCONTD: amphetamine-dextroamphetamine (ADDERALL) 10 MG tablet    Sig: Take 1 tablet  (10 mg total) by mouth daily with breakfast. Fill on or after 08/17/2016.    Dispense:  30 tablet    Refill:  0  . amphetamine-dextroamphetamine (ADDERALL) 10 MG tablet    Sig: Take 1 tablet (10 mg total) by mouth daily with breakfast. Fill on or after 09/16/2016.    Dispense:  30 tablet    Refill:  0    Penni Homans, MD

## 2016-07-06 ENCOUNTER — Ambulatory Visit: Payer: Managed Care, Other (non HMO) | Admitting: Psychology

## 2016-07-22 ENCOUNTER — Other Ambulatory Visit: Payer: Self-pay | Admitting: Family Medicine

## 2016-09-04 ENCOUNTER — Other Ambulatory Visit: Payer: Self-pay | Admitting: Family Medicine

## 2016-09-06 ENCOUNTER — Ambulatory Visit (INDEPENDENT_AMBULATORY_CARE_PROVIDER_SITE_OTHER): Payer: Managed Care, Other (non HMO) | Admitting: Psychology

## 2016-09-06 DIAGNOSIS — F4323 Adjustment disorder with mixed anxiety and depressed mood: Secondary | ICD-10-CM

## 2016-09-07 ENCOUNTER — Other Ambulatory Visit: Payer: Self-pay

## 2016-09-07 MED ORDER — VENLAFAXINE HCL ER 75 MG PO CP24: 75.0000 mg | ORAL_CAPSULE | Freq: Every day | ORAL | 3 refills | Status: DC

## 2016-09-08 ENCOUNTER — Other Ambulatory Visit: Payer: Self-pay | Admitting: Family Medicine

## 2016-09-08 MED ORDER — VENLAFAXINE HCL ER 75 MG PO CP24: 75.0000 mg | ORAL_CAPSULE | Freq: Every day | ORAL | 1 refills | Status: DC

## 2016-10-06 ENCOUNTER — Ambulatory Visit (INDEPENDENT_AMBULATORY_CARE_PROVIDER_SITE_OTHER): Payer: Managed Care, Other (non HMO) | Admitting: Psychology

## 2016-10-06 DIAGNOSIS — F4323 Adjustment disorder with mixed anxiety and depressed mood: Secondary | ICD-10-CM

## 2016-11-01 ENCOUNTER — Other Ambulatory Visit: Payer: Self-pay | Admitting: Family Medicine

## 2016-11-17 ENCOUNTER — Other Ambulatory Visit: Payer: Self-pay | Admitting: Family Medicine

## 2016-11-17 MED ORDER — AMPHETAMINE-DEXTROAMPHETAMINE 10 MG PO TABS: 10.0000 mg | ORAL_TABLET | Freq: Every day | ORAL | 0 refills | Status: DC

## 2016-11-18 ENCOUNTER — Telehealth: Payer: Self-pay | Admitting: Family Medicine

## 2016-11-18 NOTE — Telephone Encounter (Signed)
We had received a refill request on the husbands mychart to refill the patients vyvanse. But unfortunately adderall was printed out (for 3 months).  They wanted Vyvanse, but it is not on current list. Advise Last refill for Vyvanse 06/28/2016 Last office visit 06/28/2016 No UDS/No Contract They returned the adderall prescripions.

## 2016-11-20 NOTE — Telephone Encounter (Signed)
She needs contract and UDS. Can have Vyvanse 70 mg tabs, 1 tab po daily June, July and August presciption

## 2016-11-21 MED ORDER — LISDEXAMFETAMINE DIMESYLATE 70 MG PO CAPS: 70.0000 mg | ORAL_CAPSULE | Freq: Every day | ORAL | 0 refills | Status: DC

## 2016-11-21 NOTE — Telephone Encounter (Signed)
Printed vyvanse and will pickup at her conveneince and do UDS patient notified

## 2016-11-24 ENCOUNTER — Ambulatory Visit: Payer: Managed Care, Other (non HMO) | Admitting: Psychology

## 2016-11-26 ENCOUNTER — Other Ambulatory Visit: Payer: Self-pay | Admitting: Family Medicine

## 2016-11-28 NOTE — Telephone Encounter (Signed)
Pt is requesting Symbicort last rx 05/29/13

## 2016-12-13 ENCOUNTER — Ambulatory Visit (INDEPENDENT_AMBULATORY_CARE_PROVIDER_SITE_OTHER): Payer: Managed Care, Other (non HMO) | Admitting: Psychology

## 2016-12-13 DIAGNOSIS — F4323 Adjustment disorder with mixed anxiety and depressed mood: Secondary | ICD-10-CM

## 2016-12-25 ENCOUNTER — Encounter: Payer: Self-pay | Admitting: Family Medicine

## 2016-12-26 ENCOUNTER — Ambulatory Visit (INDEPENDENT_AMBULATORY_CARE_PROVIDER_SITE_OTHER): Payer: Managed Care, Other (non HMO) | Admitting: Family Medicine

## 2016-12-26 DIAGNOSIS — F988 Other specified behavioral and emotional disorders with onset usually occurring in childhood and adolescence: Secondary | ICD-10-CM

## 2016-12-26 DIAGNOSIS — F418 Other specified anxiety disorders: Secondary | ICD-10-CM

## 2016-12-26 DIAGNOSIS — J45909 Unspecified asthma, uncomplicated: Secondary | ICD-10-CM | POA: Diagnosis not present

## 2016-12-26 DIAGNOSIS — E663 Overweight: Secondary | ICD-10-CM

## 2016-12-26 DIAGNOSIS — G43009 Migraine without aura, not intractable, without status migrainosus: Secondary | ICD-10-CM

## 2016-12-26 DIAGNOSIS — E785 Hyperlipidemia, unspecified: Secondary | ICD-10-CM | POA: Diagnosis not present

## 2016-12-26 MED ORDER — LISDEXAMFETAMINE DIMESYLATE 70 MG PO CAPS: 70.0000 mg | ORAL_CAPSULE | Freq: Every day | ORAL | 0 refills | Status: DC

## 2016-12-26 MED ORDER — AMPHETAMINE-DEXTROAMPHETAMINE 10 MG PO TABS: 10.0000 mg | ORAL_TABLET | Freq: Every day | ORAL | 0 refills | Status: DC

## 2016-12-26 MED ORDER — BUDESONIDE-FORMOTEROL FUMARATE 160-4.5 MCG/ACT IN AERO: 2.0000 | INHALATION_SPRAY | Freq: Two times a day (BID) | RESPIRATORY_TRACT | 11 refills | Status: DC

## 2016-12-26 MED ORDER — VENLAFAXINE HCL ER 75 MG PO CP24: 75.0000 mg | ORAL_CAPSULE | Freq: Two times a day (BID) | ORAL | 1 refills | Status: DC

## 2016-12-26 NOTE — Patient Instructions (Signed)
DASH Eating Plan DASH stands for "Dietary Approaches to Stop Hypertension." The DASH eating plan is a healthy eating plan that has been shown to reduce high blood pressure (hypertension). It may also reduce your risk for type 2 diabetes, heart disease, and stroke. The DASH eating plan may also help with weight loss. What are tips for following this plan? General guidelines  Avoid eating more than 2,300 mg (milligrams) of salt (sodium) a day. If you have hypertension, you may need to reduce your sodium intake to 1,500 mg a day.  Limit alcohol intake to no more than 1 drink a day for nonpregnant women and 2 drinks a day for men. One drink equals 12 oz of beer, 5 oz of wine, or 1 oz of hard liquor.  Work with your health care provider to maintain a healthy body weight or to lose weight. Ask what an ideal weight is for you.  Get at least 30 minutes of exercise that causes your heart to beat faster (aerobic exercise) most days of the week. Activities may include walking, swimming, or biking.  Work with your health care provider or diet and nutrition specialist (dietitian) to adjust your eating plan to your individual calorie needs. Reading food labels  Check food labels for the amount of sodium per serving. Choose foods with less than 5 percent of the Daily Value of sodium. Generally, foods with less than 300 mg of sodium per serving fit into this eating plan.  To find whole grains, look for the word "whole" as the first word in the ingredient list. Shopping  Buy products labeled as "low-sodium" or "no salt added."  Buy fresh foods. Avoid canned foods and premade or frozen meals. Cooking  Avoid adding salt when cooking. Use salt-free seasonings or herbs instead of table salt or sea salt. Check with your health care provider or pharmacist before using salt substitutes.  Do not fry foods. Cook foods using healthy methods such as baking, boiling, grilling, and broiling instead.  Cook with  heart-healthy oils, such as olive, canola, soybean, or sunflower oil. Meal planning   Eat a balanced diet that includes: ? 5 or more servings of fruits and vegetables each day. At each meal, try to fill half of your plate with fruits and vegetables. ? Up to 6-8 servings of whole grains each day. ? Less than 6 oz of lean meat, poultry, or fish each day. A 3-oz serving of meat is about the same size as a deck of cards. One egg equals 1 oz. ? 2 servings of low-fat dairy each day. ? A serving of nuts, seeds, or beans 5 times each week. ? Heart-healthy fats. Healthy fats called Omega-3 fatty acids are found in foods such as flaxseeds and coldwater fish, like sardines, salmon, and mackerel.  Limit how much you eat of the following: ? Canned or prepackaged foods. ? Food that is high in trans fat, such as fried foods. ? Food that is high in saturated fat, such as fatty meat. ? Sweets, desserts, sugary drinks, and other foods with added sugar. ? Full-fat dairy products.  Do not salt foods before eating.  Try to eat at least 2 vegetarian meals each week.  Eat more home-cooked food and less restaurant, buffet, and fast food.  When eating at a restaurant, ask that your food be prepared with less salt or no salt, if possible. What foods are recommended? The items listed may not be a complete list. Talk with your dietitian about what   dietary choices are best for you. Grains Whole-grain or whole-wheat bread. Whole-grain or whole-wheat pasta. Brown rice. Oatmeal. Quinoa. Bulgur. Whole-grain and low-sodium cereals. Pita bread. Low-fat, low-sodium crackers. Whole-wheat flour tortillas. Vegetables Fresh or frozen vegetables (raw, steamed, roasted, or grilled). Low-sodium or reduced-sodium tomato and vegetable juice. Low-sodium or reduced-sodium tomato sauce and tomato paste. Low-sodium or reduced-sodium canned vegetables. Fruits All fresh, dried, or frozen fruit. Canned fruit in natural juice (without  added sugar). Meat and other protein foods Skinless chicken or turkey. Ground chicken or turkey. Pork with fat trimmed off. Fish and seafood. Egg whites. Dried beans, peas, or lentils. Unsalted nuts, nut butters, and seeds. Unsalted canned beans. Lean cuts of beef with fat trimmed off. Low-sodium, lean deli meat. Dairy Low-fat (1%) or fat-free (skim) milk. Fat-free, low-fat, or reduced-fat cheeses. Nonfat, low-sodium ricotta or cottage cheese. Low-fat or nonfat yogurt. Low-fat, low-sodium cheese. Fats and oils Soft margarine without trans fats. Vegetable oil. Low-fat, reduced-fat, or light mayonnaise and salad dressings (reduced-sodium). Canola, safflower, olive, soybean, and sunflower oils. Avocado. Seasoning and other foods Herbs. Spices. Seasoning mixes without salt. Unsalted popcorn and pretzels. Fat-free sweets. What foods are not recommended? The items listed may not be a complete list. Talk with your dietitian about what dietary choices are best for you. Grains Baked goods made with fat, such as croissants, muffins, or some breads. Dry pasta or rice meal packs. Vegetables Creamed or fried vegetables. Vegetables in a cheese sauce. Regular canned vegetables (not low-sodium or reduced-sodium). Regular canned tomato sauce and paste (not low-sodium or reduced-sodium). Regular tomato and vegetable juice (not low-sodium or reduced-sodium). Pickles. Olives. Fruits Canned fruit in a light or heavy syrup. Fried fruit. Fruit in cream or butter sauce. Meat and other protein foods Fatty cuts of meat. Ribs. Fried meat. Bacon. Sausage. Bologna and other processed lunch meats. Salami. Fatback. Hotdogs. Bratwurst. Salted nuts and seeds. Canned beans with added salt. Canned or smoked fish. Whole eggs or egg yolks. Chicken or turkey with skin. Dairy Whole or 2% milk, cream, and half-and-half. Whole or full-fat cream cheese. Whole-fat or sweetened yogurt. Full-fat cheese. Nondairy creamers. Whipped toppings.  Processed cheese and cheese spreads. Fats and oils Butter. Stick margarine. Lard. Shortening. Ghee. Bacon fat. Tropical oils, such as coconut, palm kernel, or palm oil. Seasoning and other foods Salted popcorn and pretzels. Onion salt, garlic salt, seasoned salt, table salt, and sea salt. Worcestershire sauce. Tartar sauce. Barbecue sauce. Teriyaki sauce. Soy sauce, including reduced-sodium. Steak sauce. Canned and packaged gravies. Fish sauce. Oyster sauce. Cocktail sauce. Horseradish that you find on the shelf. Ketchup. Mustard. Meat flavorings and tenderizers. Bouillon cubes. Hot sauce and Tabasco sauce. Premade or packaged marinades. Premade or packaged taco seasonings. Relishes. Regular salad dressings. Where to find more information:  National Heart, Lung, and Blood Institute: www.nhlbi.nih.gov  American Heart Association: www.heart.org Summary  The DASH eating plan is a healthy eating plan that has been shown to reduce high blood pressure (hypertension). It may also reduce your risk for type 2 diabetes, heart disease, and stroke.  With the DASH eating plan, you should limit salt (sodium) intake to 2,300 mg a day. If you have hypertension, you may need to reduce your sodium intake to 1,500 mg a day.  When on the DASH eating plan, aim to eat more fresh fruits and vegetables, whole grains, lean proteins, low-fat dairy, and heart-healthy fats.  Work with your health care provider or diet and nutrition specialist (dietitian) to adjust your eating plan to your individual   calorie needs. This information is not intended to replace advice given to you by your health care provider. Make sure you discuss any questions you have with your health care provider. Document Released: 05/26/2011 Document Revised: 05/30/2016 Document Reviewed: 05/30/2016 Elsevier Interactive Patient Education  2017 [employer]  

## 2016-12-26 NOTE — Assessment & Plan Note (Signed)
They are struggling with her husband's MS worsening quickly so he is struggling with depression and her anxiety is large but she feels her meds help her manage it.Marland Kitchen

## 2016-12-26 NOTE — Progress Notes (Signed)
Subjective:  I acted as a Education administrator for Dr. Charlett Blake. [place], [place]  Patient ID: Kristina Watts, female    DOB: [birth year], 46 y.o.   MRN: [id number]  Chief Complaint  Patient presents with  . Follow-up    HPI  Patient is in today for a 6 month follow up. Patient has no present acute concerns today, She has updated her UDS and control substances contract. She feels well today although she does acknowledge chronic fatigue and stress. As her husband's MS symptoms worsen they are trying to undergo a significant handicap rehabilitation of their home. 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 No recent febrile illness or hospitalization. She denies any concerning side effects with her ADD medications including but not limited to no hypertension, palpitations, anorexia or chest pain..  Patient Care Team: [name], MD as PCP - General (Family Medicine)   Past Medical History:  Diagnosis Date  . Acute bronchitis 11/19/2012  . ADD (attention deficit disorder) 04/27/2012  . Allergic state 04/19/2011  . Allergy    seasonal  . Asthma   . Asthma 04/19/2011  . Back pain 11/07/2013  . Cervical cancer screening 12/23/2014  . Chicken pox as a child  . Depression   . Depression with anxiety 04/18/2011  . Dermatitis 05/18/2011  . Fatigue 05/30/2011  . GDM (gestational diabetes mellitus) 04/19/2011  . History of gestational diabetes mellitus (GDM) 04/19/2011  . History of pancreatitis 04/19/2011  . Hyperlipidemia 04/27/2012  . LUQ pain 12/12/2011  . Migraine 08/23/2011  . Otitis media of right ear 05/30/2011  . Overweight(278.02) 04/19/2011  . Pancreatitis, recurrent ([place]) 12/12/2011  . Pharyngitis 08/23/2011  . Pneumonia 1998  . Preventative health care 04/19/2011  . Recurrent cold sores   . 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 Reflux 12/12/2011  . Tinea corporis 05/18/2011    Past Surgical History:  Procedure Laterality Date  .  APPENDECTOMY  2007  . CESAREAN SECTION  1-02 and 4-04  . CHOLECYSTECTOMY  2000    Family History  Problem Relation Age of Onset  . Thyroid disease Mother   . Hypertension Father   . Hyperlipidemia Father   . Heart disease Father   . Diabetes Father   . COPD Father        previous smoker  . Stroke Father   . Depression Father   . Restless legs syndrome Father   . Diabetes Brother   . Cancer Maternal Grandmother        Head and Neck  . Heart disease Paternal Grandmother   . Hyperlipidemia Paternal Grandmother   . Hypertension Paternal Grandmother   . Diabetes Paternal Grandmother   . Depression Paternal Grandmother   . Stroke Paternal Grandmother   . Restless legs syndrome Paternal Grandmother   . Heart disease Paternal Grandfather   . Hyperlipidemia Paternal Grandfather   . Hypertension Paternal Grandfather   . Stroke Paternal Grandfather   . Diabetes Paternal Grandfather     Social History   Social History  . Marital status: Married    Spouse name: N/A  . Number of children: N/A  . Years of education: N/A   Occupational History  . Not on file.   Social History Main Topics  . Smoking status: Never Smoker  . Smokeless tobacco: Never Used  . Alcohol use Yes     Comment: rarely  . Drug use: No  . Sexual activity: Yes  Partners: Male   Other Topics Concern  . Not on file   Social History Narrative  . No narrative on file    Outpatient Medications Prior to Visit  Medication Sig Dispense Refill  . Acetaminophen (TYLENOL EXTRA STRENGTH PO) Take 2 tablets by mouth as needed.    Marland Kitchen albuterol (ACCUNEB) 0.63 MG/3ML nebulizer solution TAKE 3 MLS (0.63 MG TOTAL) BY NEBULIZATION EVERY 6 (SIX) HOURS AS NEEDED FOR WHEEZING. 75 mL 1  . albuterol (PROVENTIL HFA;VENTOLIN HFA) 108 (90 Base) MCG/ACT inhaler Inhale 2 puffs into the lungs every 4 (four) hours as needed for wheezing or shortness of breath. 18 g 11  . Butalbital-APAP-Caffeine 50-300-40 MG CAPS Take 1 capsule  by mouth 3 (three) times daily as needed. HA 84 capsule 1  . DENAVIR 1 % cream APPLY TOPICALLY EVERY 2 (TWO) HOURS. 5 g 0  . ibuprofen (ADVIL,MOTRIN) 200 MG tablet Take 200 mg by mouth every 6 (six) hours as needed.    . montelukast (SINGULAIR) 10 MG tablet Take 1 tablet (10 mg total) by mouth at bedtime. 90 tablet 0  . Multiple Vitamin (MULTIVITAMIN) tablet Take 1 tablet by mouth daily.    . mupirocin ointment (BACTROBAN) 2 % Place 1 application into the nose 2 (two) times daily. Via clean qtip x 7 days 22 g 1  . valACYclovir (VALTREX) 1000 MG tablet TAKE 1 TABLET BY MOUTH TWICE A DAY 60 tablet 1  . amphetamine-dextroamphetamine (ADDERALL) 10 MG tablet Take 1 tablet (10 mg total) by mouth daily with breakfast. August  2018 30 tablet 0  . lisdexamfetamine (VYVANSE) 70 MG capsule Take 1 capsule (70 mg total) by mouth daily. August  2018 30 capsule 0  . SYMBICORT 160-4.5 MCG/ACT inhaler INHALE 2 PUFFS INTO THE LUNGS 2 (TWO) TIMES DAILY. 10.2 Inhaler 11  . venlafaxine XR (EFFEXOR XR) 37.5 MG 24 hr capsule Take 1 capsule (37.5 mg total) by mouth daily. 7 capsule 0  . venlafaxine XR (EFFEXOR-XR) 75 MG 24 hr capsule Take 1 capsule (75 mg total) by mouth daily. 90 capsule 1   No facility-administered medications prior to visit.     Allergies  Allergen Reactions  . Actifed [Triprolidine-Pse] Hives    Review of Systems  Constitutional: Negative for fever and malaise/fatigue.  HENT: Negative for congestion.   Eyes: Negative for blurred vision.  Respiratory: Negative for cough and shortness of breath.   Cardiovascular: Negative for chest pain, palpitations and leg swelling.  Gastrointestinal: Negative for vomiting.  Musculoskeletal: Negative for back pain.  Skin: Negative for rash.  Neurological: Negative for loss of consciousness and headaches.       Objective:    Physical Exam  Constitutional: She is oriented to person, place, and time. She appears well-developed and well-nourished. No  distress.  HENT:  Head: Normocephalic and atraumatic.  Nose: Nose normal.  Eyes: Conjunctivae are normal. Right eye exhibits no discharge. Left eye exhibits no discharge.  Neck: Normal range of motion. Neck supple. No thyromegaly present.  Cardiovascular: Normal rate and regular rhythm.   No murmur heard. Pulmonary/Chest: Effort normal and breath sounds normal. She has no wheezes.  Abdominal: Soft. Bowel sounds are normal. There is no tenderness.  Musculoskeletal: Normal range of motion. She exhibits no edema or deformity.  Neurological: She is alert and oriented to person, place, and time.  Skin: Skin is warm and dry. She is not diaphoretic.  Psychiatric: She has a normal mood and affect.  Nursing note and vitals reviewed.  BP 116/78 (BP Location: Left Arm, Patient Position: Sitting, Cuff Size: Normal)   Pulse 93   Temp 98 F (36.7 C) (Oral)   Resp 18   Wt 223 lb 9.6 oz (101.4 kg)   SpO2 98%   BMI 37.21 kg/m  Wt Readings from Last 3 Encounters:  12/26/16 223 lb 9.6 oz (101.4 kg)  06/28/16 223 lb 3.2 oz (101.2 kg)  03/28/16 216 lb 3.2 oz (98.1 kg)   BP Readings from Last 3 Encounters:  12/26/16 116/78  06/28/16 112/68  09/24/15 108/68     Immunization History  Administered Date(s) Administered  . Influenza Split 04/18/2011, 04/27/2012  . Influenza,inj,Quad PF,36+ Mos 03/06/2013, 03/26/2015  . PPD Test 09/13/2013  . Tdap 11/19/2006    Health Maintenance  Topic Date Due  . HIV Screening  03/19/1988  . TETANUS/TDAP  11/18/2016  . INFLUENZA VACCINE  01/18/2017  . PAP SMEAR  12/22/2017    Lab Results  Component Value Date   WBC 8.7 03/28/2016   HGB 14.1 03/28/2016   HCT 41.5 03/28/2016   PLT 307.0 03/28/2016   GLUCOSE 90 03/28/2016   CHOL 178 03/28/2016   TRIG 108.0 03/28/2016   HDL 46.90 03/28/2016   LDLCALC 109 (H) 03/28/2016   ALT 26 03/28/2016   AST 21 03/28/2016   NA 140 03/28/2016   K 4.4 03/28/2016   CL 104 03/28/2016   CREATININE 0.84 03/28/2016    BUN 12 03/28/2016   CO2 28 03/28/2016   TSH 1.47 03/28/2016   HGBA1C 5.4 03/29/2016    Lab Results  Component Value Date   TSH 1.47 03/28/2016   Lab Results  Component Value Date   WBC 8.7 03/28/2016   HGB 14.1 03/28/2016   HCT 41.5 03/28/2016   MCV 89.9 03/28/2016   PLT 307.0 03/28/2016   Lab Results  Component Value Date   NA 140 03/28/2016   K 4.4 03/28/2016   CO2 28 03/28/2016   GLUCOSE 90 03/28/2016   BUN 12 03/28/2016   CREATININE 0.84 03/28/2016   BILITOT 0.4 03/28/2016   ALKPHOS 69 03/28/2016   AST 21 03/28/2016   ALT 26 03/28/2016   PROT 7.1 03/28/2016   ALBUMIN 4.3 03/28/2016   CALCIUM 9.3 03/28/2016   GFR 78.64 03/28/2016   Lab Results  Component Value Date   CHOL 178 03/28/2016   Lab Results  Component Value Date   HDL 46.90 03/28/2016   Lab Results  Component Value Date   LDLCALC 109 (H) 03/28/2016   Lab Results  Component Value Date   TRIG 108.0 03/28/2016   Lab Results  Component Value Date   CHOLHDL 4 03/28/2016   Lab Results  Component Value Date   HGBA1C 5.4 03/29/2016         Assessment & Plan:   Problem List Items Addressed This Visit    Depression with anxiety (Chronic)    They are struggling with her husband's MS worsening quickly so he is struggling with depression and her anxiety is large but she feels her meds help her manage it.Marland Kitchen      Overweight (Chronic)    Encouraged DASH diet, decrease po intake and increase exercise as tolerated. Needs 7-8 hours of sleep nightly. Avoid trans fats, eat small, frequent meals every 4-5 hours with lean proteins, complex carbs and healthy fats. Minimize simple carbs, consider bariatric referral       Asthma (Chronic)    Doing well today. Only noting occasional flares in evening lately.  Relevant Medications   budesonide-formoterol (SYMBICORT) 160-4.5 MCG/ACT inhaler   Migraine    No recent flares noted      Relevant Medications   venlafaxine XR (EFFEXOR-XR) 75 MG 24  hr capsule   Hyperlipidemia    Encouraged heart healthy diet, increase exercise, avoid trans fats, consider a krill oil cap daily      ADD (attention deficit disorder)    Refills given on meds         I have discontinued Ms. Dvorsky's venlafaxine XR. I have changed her SYMBICORT to budesonide-formoterol. I have also changed her lisdexamfetamine, amphetamine-dextroamphetamine, and venlafaxine XR. Additionally, I am having her maintain her Butalbital-APAP-Caffeine, multivitamin, Acetaminophen (TYLENOL EXTRA STRENGTH PO), ibuprofen, mupirocin ointment, albuterol, montelukast, albuterol, valACYclovir, and DENAVIR.  Meds ordered this encounter  Medications  . lisdexamfetamine (VYVANSE) 70 MG capsule    Sig: Take 1 capsule (70 mg total) by mouth daily. September 2018    Dispense:  30 capsule    Refill:  0  . budesonide-formoterol (SYMBICORT) 160-4.5 MCG/ACT inhaler    Sig: Inhale 2 puffs into the lungs 2 (two) times daily.    Dispense:  10.2 Inhaler    Refill:  11  . amphetamine-dextroamphetamine (ADDERALL) 10 MG tablet    Sig: Take 1 tablet (10 mg total) by mouth daily with breakfast. September 2018    Dispense:  30 tablet    Refill:  0  . venlafaxine XR (EFFEXOR-XR) 75 MG 24 hr capsule    Sig: Take 1 capsule (75 mg total) by mouth 2 (two) times daily.    Dispense:  180 capsule    Refill:  1    CMA served as scribe during this visit. History, Physical and Plan performed by medical provider. Documentation and orders reviewed and attested to.  Penni Homans, MD

## 2016-12-26 NOTE — Assessment & Plan Note (Addendum)
Encouraged DASH diet, decrease po intake and increase exercise as tolerated. Needs 7-8 hours of sleep nightly. Avoid trans fats, eat small, frequent meals every 4-5 hours with lean proteins, complex carbs and healthy fats. Minimize simple carbs, consider bariatric referral 

## 2016-12-26 NOTE — Assessment & Plan Note (Signed)
Encouraged heart healthy diet, increase exercise, avoid trans fats, consider a krill oil cap daily 

## 2016-12-26 NOTE — Assessment & Plan Note (Signed)
Refills given on meds

## 2016-12-26 NOTE — Assessment & Plan Note (Signed)
No recent flares noted.  

## 2016-12-26 NOTE — Assessment & Plan Note (Signed)
Doing well today. Only noting occasional flares in evening lately.

## 2017-01-11 ENCOUNTER — Ambulatory Visit: Payer: Self-pay | Admitting: Psychology

## 2017-01-31 ENCOUNTER — Ambulatory Visit (INDEPENDENT_AMBULATORY_CARE_PROVIDER_SITE_OTHER): Payer: Managed Care, Other (non HMO) | Admitting: Psychology

## 2017-01-31 DIAGNOSIS — F4323 Adjustment disorder with mixed anxiety and depressed mood: Secondary | ICD-10-CM

## 2017-03-02 ENCOUNTER — Ambulatory Visit: Payer: Self-pay | Admitting: Psychology

## 2017-04-24 ENCOUNTER — Encounter: Payer: Self-pay | Admitting: Family

## 2017-04-24 ENCOUNTER — Ambulatory Visit (INDEPENDENT_AMBULATORY_CARE_PROVIDER_SITE_OTHER): Payer: Managed Care, Other (non HMO) | Admitting: Family

## 2017-04-24 VITALS — BP 118/80 | HR 96 | Temp 98.3°F | Resp 16 | Wt 226.2 lb

## 2017-04-24 DIAGNOSIS — L509 Urticaria, unspecified: Secondary | ICD-10-CM

## 2017-04-24 MED ORDER — PREDNISONE 10 MG PO TABS: ORAL_TABLET | ORAL | 0 refills | Status: DC

## 2017-04-24 MED ORDER — LISDEXAMFETAMINE DIMESYLATE 70 MG PO CAPS: 70.0000 mg | ORAL_CAPSULE | Freq: Every day | ORAL | 0 refills | Status: DC

## 2017-04-24 NOTE — Patient Instructions (Signed)
Please try eliminating perfumes and dyes from your detergents and soaps. Begin prednisone taper. Continue zyrtec twice daily. You will be contacted about your referral to the allergist. Please go to the ER if you develop tongue/lip swelling or shortness of breath.  Call if symptoms worsen or if not improved in the next 3-4 days.

## 2017-04-24 NOTE — Progress Notes (Signed)
Subjective:    Patient ID: Kristina Watts, female    DOB: Sep 02, 1970, 46 y.o.   MRN: [id number]  HPI  Kristina Watts is a 46 yr old female who presents today with chief complaint of hives. Reports that she has had hives for 1 week.  Comes and goes in different spots.  No obvious triggers.  Denies tongue or lip swelling.  Has a mild left sided sore throat.  Does have one asthma episode at night which is not unusual for her.    Review of Systems See HPI  Past Medical History:  Diagnosis Date  . Acute bronchitis 11/19/2012  . ADD (attention deficit disorder) 04/27/2012  . Allergic state 04/19/2011  . Allergy    seasonal  . Asthma   . Asthma 04/19/2011  . Back pain 11/07/2013  . Cervical cancer screening 12/23/2014  . Chicken pox as a child  . Depression   . Depression with anxiety 04/18/2011  . Dermatitis 05/18/2011  . Fatigue 05/30/2011  . GDM (gestational diabetes mellitus) 04/19/2011  . History of gestational diabetes mellitus (GDM) 04/19/2011  . History of pancreatitis 04/19/2011  . Hyperlipidemia 04/27/2012  . LUQ pain 12/12/2011  . Migraine 08/23/2011  . Otitis media of right ear 05/30/2011  . Overweight(278.02) 04/19/2011  . Pancreatitis, recurrent ([place]) 12/12/2011  . Pharyngitis 08/23/2011  . Pneumonia 1998  . Preventative health care 04/19/2011  . Recurrent cold sores   . 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 Reflux 12/12/2011  . Tinea corporis 05/18/2011     Social History   Socioeconomic History  . Marital status: Married    Spouse name: Not on file  . Number of children: Not on file  . Years of education: Not on file  . Highest education level: Not on file  Social Needs  . Financial resource strain: Not on file  . Food insecurity - worry: Not on file  . Food insecurity - inability: Not on file  . Transportation needs - medical: Not on file  . Transportation needs - non-medical: Not on file  Occupational History  . Not on file  Tobacco Use  .  Smoking status: Never Smoker  . Smokeless tobacco: Never Used  Substance and Sexual Activity  . Alcohol use: Yes    Comment: rarely  . Drug use: No  . Sexual activity: Yes    Partners: Male  Other Topics Concern  . Not on file  Social History Narrative  . Not on file    Past Surgical History:  Procedure Laterality Date  . APPENDECTOMY  2007  . CESAREAN SECTION  1-02 and 4-04  . CHOLECYSTECTOMY  2000    Family History  Problem Relation Age of Onset  . Thyroid disease Mother   . Hypertension Father   . Hyperlipidemia Father   . Heart disease Father   . Diabetes Father   . COPD Father        previous smoker  . Stroke Father   . Depression Father   . Restless legs syndrome Father   . Diabetes Brother   . Cancer Maternal Grandmother        Head and Neck  . Heart disease Paternal Grandmother   . Hyperlipidemia Paternal Grandmother   . Hypertension Paternal Grandmother   . Diabetes Paternal Grandmother   . Depression Paternal Grandmother   . Stroke Paternal Grandmother   . Restless legs syndrome Paternal Grandmother   . Heart disease Paternal Grandfather   . Hyperlipidemia Paternal  Grandfather   . Hypertension Paternal Grandfather   . Stroke Paternal Grandfather   . Diabetes Paternal Grandfather     Allergies  Allergen Reactions  . Actifed [Triprolidine-Pse] Hives    Current Outpatient Medications on File Prior to Visit  Medication Sig Dispense Refill  . Acetaminophen (TYLENOL EXTRA STRENGTH PO) Take 2 tablets by mouth as needed.    Marland Kitchen albuterol (ACCUNEB) 0.63 MG/3ML nebulizer solution TAKE 3 MLS (0.63 MG TOTAL) BY NEBULIZATION EVERY 6 (SIX) HOURS AS NEEDED FOR WHEEZING. 75 mL 1  . albuterol (PROVENTIL HFA;VENTOLIN HFA) 108 (90 Base) MCG/ACT inhaler Inhale 2 puffs into the lungs every 4 (four) hours as needed for wheezing or shortness of breath. 18 g 11  . amphetamine-dextroamphetamine (ADDERALL) 10 MG tablet Take 1 tablet (10 mg total) by mouth daily with  breakfast. September 2018 30 tablet 0  . budesonide-formoterol (SYMBICORT) 160-4.5 MCG/ACT inhaler Inhale 2 puffs into the lungs 2 (two) times daily. 10.2 Inhaler 11  . ibuprofen (ADVIL,MOTRIN) 200 MG tablet Take 200 mg by mouth every 6 (six) hours as needed.    Marland Kitchen lisdexamfetamine (VYVANSE) 70 MG capsule Take 1 capsule (70 mg total) by mouth daily. September 2018 30 capsule 0  . Multiple Vitamin (MULTIVITAMIN) tablet Take 1 tablet by mouth daily.    Marland Kitchen venlafaxine XR (EFFEXOR-XR) 75 MG 24 hr capsule Take 1 capsule (75 mg total) by mouth 2 (two) times daily. 180 capsule 1  . Butalbital-APAP-Caffeine 50-300-40 MG CAPS Take 1 capsule by mouth 3 (three) times daily as needed. HA (Patient not taking: Reported on 04/24/2017) 84 capsule 1  . DENAVIR 1 % cream APPLY TOPICALLY EVERY 2 (TWO) HOURS. (Patient not taking: Reported on 04/24/2017) 5 g 0  . montelukast (SINGULAIR) 10 MG tablet Take 1 tablet (10 mg total) by mouth at bedtime. (Patient not taking: Reported on 04/24/2017) 90 tablet 0  . mupirocin ointment (BACTROBAN) 2 % Place 1 application into the nose 2 (two) times daily. Via clean qtip x 7 days (Patient not taking: Reported on 04/24/2017) 22 g 1  . valACYclovir (VALTREX) 1000 MG tablet TAKE 1 TABLET BY MOUTH TWICE A DAY (Patient not taking: Reported on 04/24/2017) 60 tablet 1   No current facility-administered medications on file prior to visit.     BP 118/80 (BP Location: Right Arm, Cuff Size: Large)   Pulse 96   Temp 98.3 F (36.8 C) (Oral)   Resp 16   Wt 226 lb 3.2 oz (102.6 kg)   LMP 04/14/2017   SpO2 96%   BMI 37.64 kg/m       Objective:   Physical Exam  Constitutional: She is oriented to person, place, and time. She appears well-developed and well-nourished.  HENT:  Head: Normocephalic and atraumatic.  Cardiovascular: Normal rate, regular rhythm and normal heart sounds.  No murmur heard. Pulmonary/Chest: Effort normal and breath sounds normal. No respiratory distress. She has no  wheezes.  Musculoskeletal: She exhibits no edema.  Neurological: She is alert and oriented to person, place, and time.  Skin:  Diffuse urticaria noted on back and abdomen.   Psychiatric: She has a normal mood and affect. Her behavior is normal. Judgment and thought content normal.          Assessment & Plan:  Urticaria- new.  Etiology unclear. Advised pt as follows:  Please try eliminating perfumes and dyes from your detergents and soaps. Begin prednisone taper. Continue zyrtec twice daily. You will be contacted about your referral to the allergist. Please  go to the ER if you develop tongue/lip swelling or shortness of breath.  Call if symptoms worsen or if not improved in the next 3-4 days.

## 2017-05-09 ENCOUNTER — Ambulatory Visit: Payer: Self-pay | Admitting: *Deleted

## 2017-05-09 ENCOUNTER — Ambulatory Visit (INDEPENDENT_AMBULATORY_CARE_PROVIDER_SITE_OTHER): Payer: Managed Care, Other (non HMO) | Admitting: Internal Medicine

## 2017-05-09 ENCOUNTER — Encounter: Payer: Self-pay | Admitting: Internal Medicine

## 2017-05-09 VITALS — BP 126/68 | HR 80 | Temp 98.0°F | Resp 14 | Ht 65.0 in | Wt 228.0 lb

## 2017-05-09 DIAGNOSIS — L509 Urticaria, unspecified: Secondary | ICD-10-CM | POA: Diagnosis not present

## 2017-05-09 MED ORDER — PREDNISONE 10 MG PO TABS: ORAL_TABLET | ORAL | 0 refills | Status: DC

## 2017-05-09 NOTE — Progress Notes (Signed)
Subjective:    Patient ID: Kristina Watts, female    DOB: 1970/11/01, 46 y.o.   MRN: [id number]  DOS:  05/09/2017 Type of visit - description : acute Interval history: New onset of urticaria a little more than 2 weeks ago, was seen at this office, prescribed steroids, they helped.  Currently on Zyrtec daily. After she finished the steroids, she had 3 episodes of hives again.  The last one was this morning, her right face including the lip was swollen. At the time did not have any difficulty breathing, she took extra Zyrtec and symptoms went away. The patient not sure of triggers, 2 times symptoms follow-up the ingestion of [place] food and also Adderall.  Her Adderall tablet has no change in color or shape.  Review of Systems  Denies fever chills No nausea, vomiting, diarrhea No cough, wheezing or difficulty breathing.  Asthma is well controlled.  Past Medical History:  Diagnosis Date  . Acute bronchitis 11/19/2012  . ADD (attention deficit disorder) 04/27/2012  . Allergic state 04/19/2011  . Allergy    seasonal  . Asthma   . Asthma 04/19/2011  . Back pain 11/07/2013  . Cervical cancer screening 12/23/2014  . Chicken pox as a child  . Depression   . Depression with anxiety 04/18/2011  . Dermatitis 05/18/2011  . Fatigue 05/30/2011  . GDM (gestational diabetes mellitus) 04/19/2011  . History of gestational diabetes mellitus (GDM) 04/19/2011  . History of pancreatitis 04/19/2011  . Hyperlipidemia 04/27/2012  . LUQ pain 12/12/2011  . Migraine 08/23/2011  . Otitis media of right ear 05/30/2011  . Overweight(278.02) 04/19/2011  . Pancreatitis, recurrent ([place]) 12/12/2011  . Pharyngitis 08/23/2011  . Pneumonia 1998  . Preventative health care 04/19/2011  . Recurrent cold sores   . 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 Reflux 12/12/2011  . Tinea corporis 05/18/2011    Past Surgical History:  Procedure Laterality Date  . APPENDECTOMY  2007  . CESAREAN SECTION  1-02 and  4-04  . CHOLECYSTECTOMY  2000    Social History   Socioeconomic History  . Marital status: Married    Spouse name: Not on file  . Number of children: Not on file  . Years of education: Not on file  . Highest education level: Not on file  Social Needs  . Financial resource strain: Not on file  . Food insecurity - worry: Not on file  . Food insecurity - inability: Not on file  . Transportation needs - medical: Not on file  . Transportation needs - non-medical: Not on file  Occupational History  . Not on file  Tobacco Use  . Smoking status: Never Smoker  . Smokeless tobacco: Never Used  Substance and Sexual Activity  . Alcohol use: Yes    Comment: rarely  . Drug use: No  . Sexual activity: Yes    Partners: Male  Other Topics Concern  . Not on file  Social History Narrative  . Not on file      Allergies as of 05/09/2017      Reactions   Actifed [triprolidine-pse] Hives      Medication List        Accurate as of 05/09/17 11:59 PM. Always use your most recent med list.          albuterol 108 (90 Base) MCG/ACT inhaler Commonly known as:  PROVENTIL HFA;VENTOLIN HFA Inhale 2 puffs into the lungs every 4 (four) hours as needed for wheezing or shortness of  breath.   albuterol 0.63 MG/3ML nebulizer solution Commonly known as:  ACCUNEB TAKE 3 MLS (0.63 MG TOTAL) BY NEBULIZATION EVERY 6 (SIX) HOURS AS NEEDED FOR WHEEZING.   amphetamine-dextroamphetamine 10 MG tablet Commonly known as:  ADDERALL Take 1 tablet (10 mg total) by mouth daily with breakfast. September 2018   budesonide-formoterol 160-4.5 MCG/ACT inhaler Commonly known as:  SYMBICORT Inhale 2 puffs into the lungs 2 (two) times daily.   Butalbital-APAP-Caffeine 50-300-40 MG Caps Take 1 capsule by mouth 3 (three) times daily as needed. HA   DENAVIR 1 % cream Generic drug:  penciclovir APPLY TOPICALLY EVERY 2 (TWO) HOURS.   ibuprofen 200 MG tablet Commonly known as:  ADVIL,MOTRIN Take 200 mg by mouth  every 6 (six) hours as needed.   lisdexamfetamine 70 MG capsule Commonly known as:  VYVANSE Take 1 capsule (70 mg total) daily by mouth. September 2018   montelukast 10 MG tablet Commonly known as:  SINGULAIR Take 1 tablet (10 mg total) by mouth at bedtime.   multivitamin tablet Take 1 tablet by mouth daily.   mupirocin ointment 2 % Commonly known as:  BACTROBAN Place 1 application into the nose 2 (two) times daily. Via clean qtip x 7 days   predniSONE 10 MG tablet Commonly known as:  DELTASONE 4 tablets x 2 days, 3 tabs x 2 days, 2 tabs x 2 days, 1 tab x 2 days   TYLENOL EXTRA STRENGTH PO Take 2 tablets by mouth as needed.   valACYclovir 1000 MG tablet Commonly known as:  VALTREX TAKE 1 TABLET BY MOUTH TWICE A DAY   venlafaxine XR 75 MG 24 hr capsule Commonly known as:  EFFEXOR-XR Take 1 capsule (75 mg total) by mouth 2 (two) times daily.          Objective:   Physical Exam BP 126/68 (BP Location: Left Arm, Patient Position: Sitting, Cuff Size: Normal)   Pulse 80   Temp 98 F (36.7 C) (Oral)   Resp 14   Ht 5\' 5"  (1.651 m)   Wt 228 lb (103.4 kg)   LMP 04/14/2017   SpO2 97%   BMI 37.94 kg/m   General:   Well developed, well nourished . NAD.  HEENT:  Normocephalic . Face symmetric, atraumatic. Lungs:  CTA B Normal respiratory effort, no intercostal retractions, no accessory muscle use. Heart: RRR,  no murmur.  No pretibial edema bilaterally  Skin: Not pale. Not jaundice Neurologic:  alert & oriented X3.  Speech normal, gait appropriate for age and unassisted Psych--  Cognition and judgment appear intact.  Cooperative with normal attention span and concentration.  Behavior appropriate. No anxious or depressed appearing.      Assessment & Plan:   46 year old lady with history of ADD, asthma, GERD, anxiety depression presents with  Urticaria: New onset, about 2 weeks ago, since she was treated with the steroids had 3 more episodes, the last one this  morning.  Had facial swelling for the first time this morning. She is afraid to stopping antihistamines before seeing the allergist on 05/17/2017. Plan: -Continue Zyrtec daily, additional Benadryl if she feels is getting another episode. -If sx continue take prednisone and go to the ER if symptoms severe. Keep the appointment with the allergist, if she is doing great okay to try to hold antihistaminics for 2-3 days before that visit. Etiology is not clear but possibly related to [place] or  Adderall.  Recommend avoidance for now.  Today, I spent more than 25  min with the patient: >50% of the time counseling regards to his management of urticaria, go to the ER if symptoms severe, pros and cons of holding antihistaminics before  the allergy visit

## 2017-05-09 NOTE — Progress Notes (Signed)
Pre visit review using our clinic review tool, if applicable. No additional management support is needed unless otherwise documented below in the visit note. 

## 2017-05-09 NOTE — Patient Instructions (Signed)
Continue Zyrtec daily  If needed, take a Benadryl    If symptoms get worse, start prednisone  Okay to try to hold Zyrtec 2 days before you see the allergist only if you are feeling well  ER if severe facial swelling, lip or tongue swelling.

## 2017-05-09 NOTE — Telephone Encounter (Signed)
Called and spoke with the patients husband (who made the appt).  That if symptoms get worse to go to the ER asap. Husband stated symptoms are making her asthma kick in, she is currently at work (I did call her work and left a detailed message to call).  Otherwise the appt time is good and will be seen by Dr. Larose Kells today.

## 2017-05-09 NOTE — Telephone Encounter (Signed)
Feels like it is getting worse with the swelling in her lips and face. Care advice given.  Reason for Disposition . [1] Hives has occurred 3 or more times in the last year AND [2] the cause was not found  Answer Assessment - Initial Assessment Questions 1. APPEARANCE: "What does the rash look like?"  red 2. LOCATION: "Where is the rash located?"      Any where were clothing restricts 3. NUMBER: "How many hives are there?"      No sure 4. SIZE: "How big are the hives?" (inches, cm, compare to coins) "Do they all look the same or is there lots of variation in shape and size?"      Big, all look alike 5. ONSET: "When did the hives begin?" (Hours or days ago)      2 weeks ago 6. ITCHING: "Does it itch?" If so, ask: "How bad is the itch?"    - MILD: doesn't interfere with normal activities   - MODERATE-SEVERE: interferes with work, school, sleep, or other activities      moderate 7. RECURRENT PROBLEM: "Have you had hives before?" If so, ask: "When was the last time?" and "What happened that time?"      no 8. TRIGGERS: "Were you exposed to any new food, plant, cosmetic product or animal just before the hives began?"     no 9. OTHER SYMPTOMS: "Do you have any other symptoms?" (e.g., fever, tongue swelling, difficulty breathing, abdominal pain)     Difficulty breathing from asthma 10. PREGNANCY: "Is there any chance you are pregnant?" "When was your last menstrual period?"       no  Protocols used: HIVES-A-AH

## 2017-05-22 ENCOUNTER — Other Ambulatory Visit: Payer: Self-pay | Admitting: Family Medicine

## 2017-05-22 MED ORDER — LISDEXAMFETAMINE DIMESYLATE 70 MG PO CAPS: 70.0000 mg | ORAL_CAPSULE | Freq: Every day | ORAL | 0 refills | Status: DC

## 2017-05-22 MED ORDER — AMPHETAMINE-DEXTROAMPHETAMINE 10 MG PO TABS: 10.0000 mg | ORAL_TABLET | Freq: Every day | ORAL | 0 refills | Status: DC

## 2017-05-27 ENCOUNTER — Other Ambulatory Visit: Payer: Self-pay | Admitting: Family Medicine

## 2017-06-02 ENCOUNTER — Telehealth: Payer: Self-pay | Admitting: Family Medicine
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
## 2017-07-11 ENCOUNTER — Encounter: Payer: Self-pay | Admitting: Family Medicine

## 2017-07-13 ENCOUNTER — Encounter: Payer: Self-pay | Admitting: Family Medicine

## 2017-07-13 ENCOUNTER — Ambulatory Visit (INDEPENDENT_AMBULATORY_CARE_PROVIDER_SITE_OTHER): Payer: Managed Care, Other (non HMO) | Admitting: Family Medicine

## 2017-07-13 VITALS — BP 118/63 | HR 83 | Temp 98.3°F | Resp 16 | Ht 65.0 in | Wt 226.8 lb

## 2017-07-13 DIAGNOSIS — E785 Hyperlipidemia, unspecified: Secondary | ICD-10-CM | POA: Diagnosis not present

## 2017-07-13 DIAGNOSIS — Z1231 Encounter for screening mammogram for malignant neoplasm of breast: Secondary | ICD-10-CM | POA: Diagnosis not present

## 2017-07-13 DIAGNOSIS — E663 Overweight: Secondary | ICD-10-CM

## 2017-07-13 DIAGNOSIS — F988 Other specified behavioral and emotional disorders with onset usually occurring in childhood and adolescence: Secondary | ICD-10-CM | POA: Diagnosis not present

## 2017-07-13 DIAGNOSIS — Z1239 Encounter for other screening for malignant neoplasm of breast: Secondary | ICD-10-CM

## 2017-07-13 DIAGNOSIS — F418 Other specified anxiety disorders: Secondary | ICD-10-CM

## 2017-07-13 DIAGNOSIS — Z0000 Encounter for general adult medical examination without abnormal findings: Secondary | ICD-10-CM

## 2017-07-13 MED ORDER — LISDEXAMFETAMINE DIMESYLATE 70 MG PO CAPS: 70.0000 mg | ORAL_CAPSULE | Freq: Every day | ORAL | 0 refills | Status: DC

## 2017-07-13 MED ORDER — TRIAMCINOLONE 0.1 % CREAM:EUCERIN CREAM 1:1: 1.0000 "application " | TOPICAL_CREAM | Freq: Two times a day (BID) | CUTANEOUS | 2 refills | Status: DC | PRN

## 2017-07-13 NOTE — Assessment & Plan Note (Signed)
Patient encouraged to maintain heart healthy diet, regular exercise, adequate sleep. Consider daily probiotics. Take medications as prescribed 

## 2017-07-13 NOTE — Assessment & Plan Note (Signed)
Encouraged DASH diet, decrease po intake and increase exercise as tolerated. Needs 7-8 hours of sleep nightly. Avoid trans fats, eat small, frequent meals every 4-5 hours with lean proteins, complex carbs and healthy fats. Minimize simple carbs 

## 2017-07-13 NOTE — Assessment & Plan Note (Signed)
Encouraged heart healthy diet, increase exercise, avoid trans fats, consider a krill oil cap daily 

## 2017-07-13 NOTE — Patient Instructions (Signed)
Preventive Care 18-39 Years, Female Preventive care refers to lifestyle choices and visits with your health care provider that can promote health and wellness. What does preventive care include?  A yearly physical exam. This is also called an annual well check.  Dental exams once or twice a year.  Routine eye exams. Ask your health care provider how often you should have your eyes checked.  Personal lifestyle choices, including: ? Daily care of your teeth and gums. ? Regular physical activity. ? Eating a healthy diet. ? Avoiding tobacco and drug use. ? Limiting alcohol use. ? Practicing safe sex. ? Taking vitamin and mineral supplements as recommended by your health care provider. What happens during an annual well check? The services and screenings done by your health care provider during your annual well check will depend on your age, overall health, lifestyle risk factors, and family history of disease. Counseling Your health care provider may ask you questions about your:  Alcohol use.  Tobacco use.  Drug use.  Emotional well-being.  Home and relationship well-being.  Sexual activity.  Eating habits.  Work and work Statistician.  Method of birth control.  Menstrual cycle.  Pregnancy history.  Screening You may have the following tests or measurements:  Height, weight, and BMI.  Diabetes screening. This is done by checking your blood sugar (glucose) after you have not eaten for a while (fasting).  Blood pressure.  Lipid and cholesterol levels. These may be checked every 5 years starting at age 66.  Skin check.  Hepatitis C blood test.  Hepatitis B blood test.  Sexually transmitted disease (STD) testing.  BRCA-related cancer screening. This may be done if you have a family history of breast, ovarian, tubal, or peritoneal cancers.  Pelvic exam and Pap test. This may be done every 3 years starting at age 40. Starting at age 59, this may be done every 5  years if you have a Pap test in combination with an HPV test.  Discuss your test results, treatment options, and if necessary, the need for more tests with your health care provider. Vaccines Your health care provider may recommend certain vaccines, such as:  Influenza vaccine. This is recommended every year.  Tetanus, diphtheria, and acellular pertussis (Tdap, Td) vaccine. You may need a Td booster every 10 years.  Varicella vaccine. You may need this if you have not been vaccinated.  HPV vaccine. If you are 69 or younger, you may need three doses over 6 months.  Measles, mumps, and rubella (MMR) vaccine. You may need at least one dose of MMR. You may also need a second dose.  Pneumococcal 13-valent conjugate (PCV13) vaccine. You may need this if you have certain conditions and were not previously vaccinated.  Pneumococcal polysaccharide (PPSV23) vaccine. You may need one or two doses if you smoke cigarettes or if you have certain conditions.  Meningococcal vaccine. One dose is recommended if you are age 27-21 years and a first-year college student living in a residence hall, or if you have one of several medical conditions. You may also need additional booster doses.  Hepatitis A vaccine. You may need this if you have certain conditions or if you travel or work in places where you may be exposed to hepatitis A.  Hepatitis B vaccine. You may need this if you have certain conditions or if you travel or work in places where you may be exposed to hepatitis B.  Haemophilus influenzae type b (Hib) vaccine. You may need this if  you have certain risk factors.  Talk to your health care provider about which screenings and vaccines you need and how often you need them. This information is not intended to replace advice given to you by your health care provider. Make sure you discuss any questions you have with your health care provider. Document Released: 08/02/2001 Document Revised: 02/24/2016  Document Reviewed: 04/07/2015 Elsevier Interactive Patient Education  [employer].

## 2017-07-13 NOTE — Assessment & Plan Note (Signed)
Doing well on current meds. No changes  
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
## 2017-07-16 NOTE — Progress Notes (Signed)
Patient ID: Kristina Watts, female   DOB: [birth year], 47 y.o.   MRN: [id number]   Subjective:    Patient ID: Kristina Watts, female    DOB: Mar 04, 1971, 47 y.o.   MRN: [id number]  Chief Complaint  Patient presents with  . Annual Exam    has not had labs before appt    HPI Patient is in today for annual physical exam. She has been under a great deal of stress with her daughter's recent surgery, her husband's worsening MS, a house remodel, her father's poor health and work but she feels she is managing with current meds. No recent febrile illness or hospitalizations. Denies CP/palp/SOB/HA/congestion/fevers/GI or GU c/o. Taking meds as prescribed  Past Medical History:  Diagnosis Date  . Acute bronchitis 11/19/2012  . ADD (attention deficit disorder) 04/27/2012  . Allergic state 04/19/2011  . Allergy    seasonal  . Asthma   . Asthma 04/19/2011  . Back pain 11/07/2013  . Cervical cancer screening 12/23/2014  . Chicken pox as a child  . Depression   . Depression with anxiety 04/18/2011  . Dermatitis 05/18/2011  . Fatigue 05/30/2011  . GDM (gestational diabetes mellitus) 04/19/2011  . History of gestational diabetes mellitus (GDM) 04/19/2011  . History of pancreatitis 04/19/2011  . Hyperlipidemia 04/27/2012  . LUQ pain 12/12/2011  . Migraine 08/23/2011  . Otitis media of right ear 05/30/2011  . Overweight(278.02) 04/19/2011  . Pancreatitis, recurrent ([place]) 12/12/2011  . Pharyngitis 08/23/2011  . Pneumonia 1998  . Preventative health care 04/19/2011  . Recurrent cold sores   . 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 Reflux 12/12/2011  . Tinea corporis 05/18/2011    Past Surgical History:  Procedure Laterality Date  . APPENDECTOMY  2007  . CESAREAN SECTION  1-02 and 4-04  . CHOLECYSTECTOMY  2000    Family History  Problem Relation Age of Onset  . Thyroid disease Mother   . Hypertension Father   . Hyperlipidemia Father   . Heart disease Father   . Diabetes Father     . COPD Father        previous smoker  . Stroke Father   . Depression Father   . Restless legs syndrome Father   . Diabetes Brother   . Cancer Maternal Grandmother        Head and Neck  . Heart disease Paternal Grandmother   . Hyperlipidemia Paternal Grandmother   . Hypertension Paternal Grandmother   . Diabetes Paternal Grandmother   . Depression Paternal Grandmother   . Stroke Paternal Grandmother   . Restless legs syndrome Paternal Grandmother   . Heart disease Paternal Grandfather   . Hyperlipidemia Paternal Grandfather   . Hypertension Paternal Grandfather   . Stroke Paternal Grandfather   . Diabetes Paternal Grandfather     Social History   Socioeconomic History  . Marital status: Married    Spouse name: Not on file  . Number of children: Not on file  . Years of education: Not on file  . Highest education level: Not on file  Social Needs  . Financial resource strain: Not on file  . Food insecurity - worry: Not on file  . Food insecurity - inability: Not on file  . Transportation needs - medical: Not on file  . Transportation needs - non-medical: Not on file  Occupational History  . Not on file  Tobacco Use  . Smoking status: Never Smoker  . Smokeless tobacco: Never Used  Substance and  Sexual Activity  . Alcohol use: Yes    Comment: rarely  . Drug use: No  . Sexual activity: Yes    Partners: Male  Other Topics Concern  . Not on file  Social History Narrative  . Not on file    Outpatient Medications Prior to Visit  Medication Sig Dispense Refill  . Acetaminophen (TYLENOL EXTRA STRENGTH PO) Take 2 tablets by mouth as needed.    Marland Kitchen albuterol (ACCUNEB) 0.63 MG/3ML nebulizer solution TAKE 3 MLS (0.63 MG TOTAL) BY NEBULIZATION EVERY 6 (SIX) HOURS AS NEEDED FOR WHEEZING. 75 mL 1  . albuterol (PROVENTIL HFA;VENTOLIN HFA) 108 (90 Base) MCG/ACT inhaler Inhale 2 puffs into the lungs every 4 (four) hours as needed for wheezing or shortness of breath. 18 g 11  .  amphetamine-dextroamphetamine (ADDERALL) 10 MG tablet Take 1 tablet (10 mg total) by mouth daily with breakfast. December 2018 30 tablet 0  . amphetamine-dextroamphetamine (ADDERALL) 10 MG tablet Take 1 tablet (10 mg total) by mouth daily. February 2019 30 tablet 0  . Azelastine-Fluticasone 137-50 MCG/ACT SUSP Place into the nose.    . budesonide-formoterol (SYMBICORT) 160-4.5 MCG/ACT inhaler Inhale 2 puffs into the lungs 2 (two) times daily. 10.2 Inhaler 11  . Butalbital-APAP-Caffeine 50-300-40 MG CAPS Take 1 capsule by mouth 3 (three) times daily as needed. HA 84 capsule 1  . DENAVIR 1 % cream APPLY TO AFFECTED AREA TOPICALLY EVERY 2 HOURS 5 g 0  . ibuprofen (ADVIL,MOTRIN) 200 MG tablet Take 200 mg by mouth every 6 (six) hours as needed.    Marland Kitchen levocetirizine (XYZAL) 5 MG tablet Take 5 mg by mouth every evening.    . montelukast (SINGULAIR) 10 MG tablet Take 1 tablet (10 mg total) by mouth at bedtime. 90 tablet 0  . Multiple Vitamin (MULTIVITAMIN) tablet Take 1 tablet by mouth daily.    . mupirocin ointment (BACTROBAN) 2 % Place 1 application into the nose 2 (two) times daily. Via clean qtip x 7 days 22 g 1  . valACYclovir (VALTREX) 1000 MG tablet TAKE 1 TABLET BY MOUTH TWICE A DAY 60 tablet 1  . venlafaxine XR (EFFEXOR-XR) 75 MG 24 hr capsule Take 1 capsule (75 mg total) by mouth 2 (two) times daily. 180 capsule 1  . lisdexamfetamine (VYVANSE) 70 MG capsule Take 1 capsule (70 mg total) by mouth daily. December 2018 30 capsule 0  . lisdexamfetamine (VYVANSE) 70 MG capsule Take 1 capsule (70 mg total) by mouth daily. February 2019 30 capsule 0  . amphetamine-dextroamphetamine (ADDERALL) 10 MG tablet Take 1 tablet (10 mg total) by mouth daily. January 2019 30 tablet 0  . lisdexamfetamine (VYVANSE) 70 MG capsule Take 1 capsule (70 mg total) by mouth daily. January 2019 30 capsule 0  . predniSONE (DELTASONE) 10 MG tablet 4 tablets x 2 days, 3 tabs x 2 days, 2 tabs x 2 days, 1 tab x 2 days 20 tablet 0    No facility-administered medications prior to visit.     Allergies  Allergen Reactions  . Actifed [Triprolidine-Pse] Hives    Review of Systems  Constitutional: Positive for malaise/fatigue. Negative for chills and fever.  HENT: Negative for congestion and hearing loss.   Eyes: Negative for discharge.  Respiratory: Negative for cough, sputum production and shortness of breath.   Cardiovascular: Negative for chest pain, palpitations and leg swelling.  Gastrointestinal: Negative for abdominal pain, blood in stool, constipation, diarrhea, heartburn, nausea and vomiting.  Genitourinary: Negative for dysuria, frequency, hematuria and urgency.  Musculoskeletal: Negative for back pain, falls and myalgias.  Skin: Negative for rash.  Neurological: Negative for dizziness, sensory change, loss of consciousness, weakness and headaches.  Endo/Heme/Allergies: Negative for environmental allergies. Does not bruise/bleed easily.  Psychiatric/Behavioral: Positive for depression. Negative for suicidal ideas. The patient is nervous/anxious. The patient does not have insomnia.        Objective:    Physical Exam  Constitutional: She is oriented to person, place, and time. She appears well-developed and well-nourished. No distress.  HENT:  Head: Normocephalic and atraumatic.  Eyes: Conjunctivae are normal.  Neck: Neck supple. No thyromegaly present.  Cardiovascular: Normal rate, regular rhythm and normal heart sounds.  No murmur heard. Pulmonary/Chest: Effort normal and breath sounds normal. No respiratory distress.  Abdominal: Soft. Bowel sounds are normal. She exhibits no distension and no mass. There is no tenderness.  Musculoskeletal: She exhibits no edema.  Lymphadenopathy:    She has no cervical adenopathy.  Neurological: She is alert and oriented to person, place, and time.  Skin: Skin is warm and dry.  Psychiatric: She has a normal mood and affect. Her behavior is normal.    BP  118/63 (BP Location: Left Arm, Patient Position: Sitting, Cuff Size: Large)   Pulse 83   Temp 98.3 F (36.8 C) (Oral)   Resp 16   Ht 5\' 5"  (1.651 m)   Wt 226 lb 12.8 oz (102.9 kg)   SpO2 97%   BMI 37.74 kg/m  Wt Readings from Last 3 Encounters:  07/13/17 226 lb 12.8 oz (102.9 kg)  05/09/17 228 lb (103.4 kg)  04/24/17 226 lb 3.2 oz (102.6 kg)     Lab Results  Component Value Date   WBC 8.7 03/28/2016   HGB 14.1 03/28/2016   HCT 41.5 03/28/2016   PLT 307.0 03/28/2016   GLUCOSE 90 03/28/2016   CHOL 178 03/28/2016   TRIG 108.0 03/28/2016   HDL 46.90 03/28/2016   LDLCALC 109 (H) 03/28/2016   ALT 26 03/28/2016   AST 21 03/28/2016   NA 140 03/28/2016   K 4.4 03/28/2016   CL 104 03/28/2016   CREATININE 0.84 03/28/2016   BUN 12 03/28/2016   CO2 28 03/28/2016   TSH 1.47 03/28/2016   HGBA1C 5.4 03/29/2016    Lab Results  Component Value Date   TSH 1.47 03/28/2016   Lab Results  Component Value Date   WBC 8.7 03/28/2016   HGB 14.1 03/28/2016   HCT 41.5 03/28/2016   MCV 89.9 03/28/2016   PLT 307.0 03/28/2016   Lab Results  Component Value Date   NA 140 03/28/2016   K 4.4 03/28/2016   CO2 28 03/28/2016   GLUCOSE 90 03/28/2016   BUN 12 03/28/2016   CREATININE 0.84 03/28/2016   BILITOT 0.4 03/28/2016   ALKPHOS 69 03/28/2016   AST 21 03/28/2016   ALT 26 03/28/2016   PROT 7.1 03/28/2016   ALBUMIN 4.3 03/28/2016   CALCIUM 9.3 03/28/2016   GFR 78.64 03/28/2016   Lab Results  Component Value Date   CHOL 178 03/28/2016   Lab Results  Component Value Date   HDL 46.90 03/28/2016   Lab Results  Component Value Date   LDLCALC 109 (H) 03/28/2016   Lab Results  Component Value Date   TRIG 108.0 03/28/2016   Lab Results  Component Value Date   CHOLHDL 4 03/28/2016   Lab Results  Component Value Date   HGBA1C 5.4 03/29/2016       Assessment & Plan:  Problem List Items Addressed This Visit    Depression with anxiety (Chronic)    Great stress with  work, her ailing husband and father. Recent illness of her daughter but overall she is managing on her current meds      Overweight (Chronic)    Encouraged DASH diet, decrease po intake and increase exercise as tolerated. Needs 7-8 hours of sleep nightly. Avoid trans fats, eat small, frequent meals every 4-5 hours with lean proteins, complex carbs and healthy fats. Minimize simple carbs      Preventative health care    Patient encouraged to maintain heart healthy diet, regular exercise, adequate sleep. Consider daily probiotics. Take medications as prescribed      Hyperlipidemia    Encouraged heart healthy diet, increase exercise, avoid trans fats, consider a krill oil cap daily      ADD (attention deficit disorder)    Doing well on current meds. No changes.        Other Visit Diagnoses    Breast cancer screening    -  Primary   Relevant Orders   MM SCREENING BREAST TOMO BILATERAL      I have discontinued Manar A. Ayuso "Ann"'s predniSONE. I have also changed her lisdexamfetamine. Additionally, I am having her start on triamcinolone 0.1 % cream : eucerin. Lastly, I am having her maintain her Butalbital-APAP-Caffeine, multivitamin, Acetaminophen (TYLENOL EXTRA STRENGTH PO), ibuprofen, mupirocin ointment, albuterol, montelukast, albuterol, valACYclovir, budesonide-formoterol, venlafaxine XR, amphetamine-dextroamphetamine, amphetamine-dextroamphetamine, DENAVIR, levocetirizine, Azelastine-Fluticasone, lisdexamfetamine, and lisdexamfetamine.  Meds ordered this encounter  Medications  . Triamcinolone Acetonide (TRIAMCINOLONE 0.1 % CREAM : EUCERIN) CREA    Sig: Apply 1 application topically 2 (two) times daily as needed.    Dispense:  1 each    Refill:  2  . lisdexamfetamine (VYVANSE) 70 MG capsule    Sig: Take 1 capsule (70 mg total) by mouth daily. March 2019    Dispense:  30 capsule    Refill:  0  . lisdexamfetamine (VYVANSE) 70 MG capsule    Sig: Take 1 capsule (70 mg total)  by mouth daily. January 2019    Dispense:  30 capsule    Refill:  0  . lisdexamfetamine (VYVANSE) 70 MG capsule    Sig: Take 1 capsule (70 mg total) by mouth daily. February 2019    Dispense:  30 capsule    Refill:  0     Penni Homans, MD

## 2017-08-06 ENCOUNTER — Emergency Department (HOSPITAL_BASED_OUTPATIENT_CLINIC_OR_DEPARTMENT_OTHER)
Admission: EM | Admit: 2017-08-06 | Discharge: 2017-08-06 | Disposition: A | Discharge status: Home / Self Care | Payer: Managed Care, Other (non HMO) | Attending: Emergency Medicine | Admitting: Emergency Medicine

## 2017-08-06 ENCOUNTER — Other Ambulatory Visit: Payer: Self-pay

## 2017-08-06 ENCOUNTER — Encounter (HOSPITAL_BASED_OUTPATIENT_CLINIC_OR_DEPARTMENT_OTHER): Payer: Self-pay | Admitting: Emergency Medicine

## 2017-08-06 DIAGNOSIS — R509 Fever, unspecified: Secondary | ICD-10-CM | POA: Diagnosis present

## 2017-08-06 DIAGNOSIS — J069 Acute upper respiratory infection, unspecified: Secondary | ICD-10-CM

## 2017-08-06 DIAGNOSIS — Z79899 Other long term (current) drug therapy: Secondary | ICD-10-CM | POA: Insufficient documentation

## 2017-08-06 DIAGNOSIS — J45901 Unspecified asthma with (acute) exacerbation: Secondary | ICD-10-CM | POA: Diagnosis not present

## 2017-08-06 LAB — INFLUENZA PANEL BY PCR (TYPE A & B)
INFLBPCR: NEGATIVE
Influenza A By PCR: POSITIVE — AB

## 2017-08-06 NOTE — ED Triage Notes (Signed)
Flu like symptoms x 3 days. Took sudafed and now concerned because BP is high.

## 2017-08-06 NOTE — ED Provider Notes (Signed)
[facility] EMERGENCY DEPARTMENT Provider Note   CSN: [id number] Arrival date & time: 08/06/17  1022     History   Chief Complaint Chief Complaint  Patient presents with  . Flu like symptoms    HPI Kristina Watts is a 47 y.o. female.  Patient who works in an [employer], history of asthma, presents with complaint of runny nose over the past 2 days.  She is concerned that she was developing flu as she has had subjective fevers but no documented fevers.  Her husband has multiple sclerosis and she wants to make sure she does not have the flu.  Patient denies ear pain, sore throat, body aches.  She has an occasional cough.  Yesterday she took Sudafed which caused her blood pressure to be very high.  She noted it was 170s over 120s.  She had a headache with this. Patient denies signs of stroke at that time such as facial droop, slurred speech, aphasia, weakness/numbness in extremities, imbalance/trouble walking.  No neck pain or stiffness. She only has a mild headache this morning. The onset of this condition was acute. The course is constant. Aggravating factors: none. Alleviating factors: none.         Past Medical History:  Diagnosis Date  . Acute bronchitis 11/19/2012  . ADD (attention deficit disorder) 04/27/2012  . Allergic state 04/19/2011  . Allergy    seasonal  . Asthma   . Asthma 04/19/2011  . Back pain 11/07/2013  . Cervical cancer screening 12/23/2014  . Chicken pox as a child  . Depression   . Depression with anxiety 04/18/2011  . Dermatitis 05/18/2011  . Fatigue 05/30/2011  . GDM (gestational diabetes mellitus) 04/19/2011  . History of gestational diabetes mellitus (GDM) 04/19/2011  . History of pancreatitis 04/19/2011  . Hyperlipidemia 04/27/2012  . LUQ pain 12/12/2011  . Migraine 08/23/2011  . Otitis media of right ear 05/30/2011  . Overweight(278.02) 04/19/2011  . Pancreatitis, recurrent ([place]) 12/12/2011  . Pharyngitis 08/23/2011  . Pneumonia 1998   . Preventative health care 04/19/2011  . Recurrent cold sores   . 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 Reflux 12/12/2011  . Tinea corporis 05/18/2011    Patient Active Problem List   Diagnosis Date Noted  . Cervical cancer screening 12/23/2014  . Back pain 11/07/2013  . Hyperlipidemia 04/27/2012  . ADD (attention deficit disorder) 04/27/2012  . GERD (gastroesophageal reflux disease) 12/12/2011  . LUQ pain 12/12/2011  . Migraine 08/23/2011  . Fatigue 05/30/2011  . Otitis media of left ear 05/30/2011  . Dermatitis 05/18/2011  . Overweight 04/19/2011  . History of gestational diabetes mellitus (GDM) 04/19/2011  . History of pancreatitis 04/19/2011  . Allergic state 04/19/2011  . Preventative health care 04/19/2011  . Asthma 04/19/2011  . Depression with anxiety 04/18/2011  . 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 APPENDECTOMY  2007  . CESAREAN SECTION  1-02 and 4-04  . CHOLECYSTECTOMY  2000    OB History    No data available       Home Medications    Prior to Admission medications   Medication Sig Start Date End Date Taking? Authorizing Provider  lisdexamfetamine (VYVANSE) 70 MG capsule Take 1 capsule (70 mg total) by mouth daily. February 2019 07/13/17  Yes [name], MD  Acetaminophen (TYLENOL EXTRA STRENGTH PO) Take 2 tablets by mouth as needed.    [provider]  albuterol (ACCUNEB) 0.63 MG/3ML nebulizer solution TAKE 3 MLS (0.63 MG TOTAL) BY NEBULIZATION EVERY 6 (SIX) HOURS AS NEEDED FOR WHEEZING. 04/14/16   [name], MD  albuterol (PROVENTIL HFA;VENTOLIN HFA) 108 (90 Base) MCG/ACT inhaler Inhale 2 puffs into the lungs every 4 (four) hours as needed for wheezing or shortness of breath. 09/24/15   [name], MD  amphetamine-dextroamphetamine (ADDERALL) 10 MG tablet Take 1 tablet (10 mg total) by mouth daily with breakfast. December 2018 05/22/17   [name], MD  amphetamine-dextroamphetamine (ADDERALL) 10 MG tablet Take 1 tablet (10 mg total) by mouth daily. February 2019 05/22/17   [name], MD  Azelastine-Fluticasone [phone] MCG/ACT SUSP Place into the nose.    [provider]  budesonide-formoterol (SYMBICORT) 160-4.5 MCG/ACT inhaler Inhale 2 puffs into the lungs 2 (two) times daily. 12/26/16   [name], MD  Butalbital-APAP-Caffeine 50-300-40 MG CAPS Take 1 capsule by mouth 3 (three) times daily as needed. HA 08/23/11   [name], MD  DENAVIR 1 % cream APPLY TO AFFECTED AREA TOPICALLY EVERY 2 HOURS 06/05/17   [name], MD  ibuprofen (ADVIL,MOTRIN) 200 MG tablet Take 200 mg by mouth every 6 (six) hours as needed.    [provider]  levocetirizine (XYZAL) 5 MG tablet Take 5 mg by mouth every evening.    [provider]  lisdexamfetamine (VYVANSE) 70 MG capsule Take 1 capsule (70 mg total) by mouth daily. March 2019 07/13/17   [name], MD  lisdexamfetamine (VYVANSE) 70 MG capsule Take 1 capsule (70 mg total) by mouth daily. January 2019 07/13/17   [name], MD  montelukast (SINGULAIR) 10 MG tablet Take 1 tablet (10 mg total) by mouth at bedtime. 02/11/16   [name], MD  Multiple Vitamin (MULTIVITAMIN) tablet Take 1 tablet by mouth daily.    [provider]  mupirocin ointment (BACTROBAN) 2 % Place 1 application into the nose 2 (two) times daily. Via clean qtip x 7 days 05/14/13   [name], MD  Triamcinolone Acetonide (TRIAMCINOLONE 0.1 % CREAM : EUCERIN) CREA Apply 1 application topically 2 (two) times daily as needed. 07/13/17   [name], MD  valACYclovir (VALTREX) 1000 MG tablet TAKE 1 TABLET BY MOUTH TWICE A DAY 11/01/16   [name], MD  venlafaxine XR (EFFEXOR-XR) 75 MG 24 hr capsule Take 1 capsule (75 mg total) by mouth 2 (two) times daily. 12/26/16   [name], MD    Family History Family History  Problem Relation Age of Onset  . Thyroid  disease Mother   . Hypertension Father   . Hyperlipidemia Father   . Heart disease Father   . Diabetes Father   . COPD Father        previous smoker  . Stroke Father   . Depression Father   . Restless legs syndrome Father   . Diabetes Brother   . Cancer Maternal Grandmother        Head and Neck  . Heart disease Paternal Grandmother   . Hyperlipidemia Paternal Grandmother   . Hypertension Paternal Grandmother   . Diabetes Paternal Grandmother   . Depression Paternal Grandmother   . Stroke Paternal Grandmother   . Restless legs syndrome Paternal Grandmother   . Heart disease Paternal Grandfather   . Hyperlipidemia Paternal Grandfather   . Hypertension Paternal Grandfather   . Stroke Paternal Grandfather   . Diabetes Paternal Grandfather  Social History Social History   Tobacco Use  . Smoking status: Never Smoker  . Smokeless tobacco: Never Used  Substance Use Topics  . Alcohol use: Yes    Comment: rarely  . Drug use: No     Allergies   Actifed [triprolidine-pse]   Review of Systems Review of Systems  Constitutional: Positive for fatigue. Negative for chills and fever.  HENT: Positive for rhinorrhea. Negative for congestion, ear pain, sinus pressure and sore throat.   Eyes: Negative for redness.  Respiratory: Positive for cough and wheezing.   Gastrointestinal: Negative for abdominal pain, diarrhea, nausea and vomiting.  Genitourinary: Negative for dysuria.  Musculoskeletal: Negative for myalgias and neck stiffness.  Skin: Negative for rash.  Neurological: Negative for dizziness, facial asymmetry, speech difficulty, weakness, numbness and headaches.  Hematological: Negative for adenopathy.     Physical Exam Updated Vital Signs BP 126/78 (BP Location: Right Arm)   Pulse 98   Temp 99.3 F (37.4 C) (Oral)   Resp 20   Ht 5\' 5"  (1.651 m)   Wt 102.1 kg (225 lb)   LMP 08/04/2017   SpO2 96%   BMI 37.44 kg/m   Physical Exam  Constitutional: She is  oriented to person, place, and time. She appears well-developed and well-nourished.  HENT:  Head: Normocephalic and atraumatic.  Right Ear: Tympanic membrane, external ear and ear canal normal.  Left Ear: Tympanic membrane, external ear and ear canal normal.  Nose: Rhinorrhea present. No mucosal edema.  Mouth/Throat: Uvula is midline, oropharynx is clear and moist and mucous membranes are normal. Mucous membranes are not dry. No oral lesions. No trismus in the jaw. No uvula swelling. No oropharyngeal exudate, posterior oropharyngeal edema, posterior oropharyngeal erythema or tonsillar abscesses.  Eyes: Conjunctivae are normal. Right eye exhibits no discharge. Left eye exhibits no discharge.  Neck: Normal range of motion. Neck supple.  Cardiovascular: Normal rate, regular rhythm and normal heart sounds.  No murmur heard. Pulmonary/Chest: Effort normal. No respiratory distress. She has wheezes (mild, end-exp). She has no rales.  Abdominal: Soft. There is no tenderness. There is no rebound and no guarding.  Lymphadenopathy:    She has no cervical adenopathy.  Neurological: She is alert and oriented to person, place, and time. No cranial nerve deficit or sensory deficit. She exhibits normal muscle tone. Coordination normal.  Skin: Skin is warm and dry.  Psychiatric: She has a normal mood and affect.  Nursing note and vitals reviewed.    ED Treatments / Results  Labs (all labs ordered are listed, but only abnormal results are displayed) Labs Reviewed  INFLUENZA PANEL BY PCR (TYPE A & B)    EKG  EKG Interpretation None       Radiology No results found.  Procedures Procedures (including critical care time)  Medications Ordered in ED Medications - No data to display   Initial Impression / Assessment and Plan / ED Course  I have reviewed the triage vital signs and the nursing notes.  Pertinent labs & imaging results that were available during my care of the patient were  reviewed by me and considered in my medical decision making (see chart for details).     Patient seen and examined.   Vital signs reviewed and are as follows: BP 126/78 (BP Location: Right Arm)   Pulse 98   Temp 99.3 F (37.4 C) (Oral)   Resp 20   Ht 5\' 5"  (1.651 m)   Wt 102.1 kg (225 lb)   LMP 08/04/2017  SpO2 96%   BMI 37.44 kg/m   Discussed with patient that her symptoms are most consistent with a viral upper respiratory tract infection.  Will test her for the flu and notify her later today if positive.  Given wheezing, offered steroids or breathing treatment here.  Patient declines.    Requested that she avoid Sudafed given her symptoms from elevated blood pressure.  Encouraged to use antihistamines if needed such as Claritin or Zyrtec.  Patient counseled on supportive care for viral URI and s/s to return including worsening symptoms, persistent fever, persistent vomiting, or if they have any other concerns. Urged to see PCP if symptoms persist for more than 3 days. Patient verbalizes understanding and agrees with plan.   5:09 PM Flu A positive. I called patient and left a message. Rx for Tamiflu called in to [facility] in [facility]    Final Clinical Impressions(s) / ED Diagnoses   Final diagnoses:  Upper respiratory tract infection, unspecified type  Mild asthma with exacerbation, unspecified whether persistent   Patient with symptoms consistent with a viral syndrome. Vitals are stable, no fever. No signs of dehydration. Lung exam normal, no signs of pneumonia. Supportive therapy indicated with return if symptoms worsen.    Patient with headache during episode of high blood pressure.  She did not have any neurological deficits then does not have any on exam here.  She does not have any signs of a subarachnoid hemorrhage with neck pain or stiffness.  Low concern for this.  Do not feel that imaging or further investigation indicated at this time.  ED Discharge Orders    None        Carlisle Cater, PA-C 08/06/17 1102    Carlisle Cater, [place] 08/06/17 1710    Little, Wenda Overland, MD 08/07/17 [phone]

## 2017-08-06 NOTE — Discharge Instructions (Signed)
Please read and follow all provided instructions.  Your diagnoses today include:  1. Upper respiratory tract infection, unspecified type   2. Mild asthma with exacerbation, unspecified whether persistent     You appear to have an upper respiratory infection (URI). An upper respiratory tract infection, or cold, is a viral infection of the air passages leading to the lungs. It should improve gradually after 5-7 days. You may have a lingering cough that lasts for 2- 4 weeks after the infection.  Tests performed today include:  Vital signs. See below for your results today.   Influenza test -you will be notified this afternoon if this is positive  Medications prescribed:   None  Take any prescribed medications only as directed. Treatment for your infection is aimed at treating the symptoms. There are no medications, such as antibiotics, that will cure your infection.   Home care instructions:  Follow any educational materials contained in this packet.   Your illness is contagious and can be spread to others, especially during the first 3 or 4 days. It cannot be cured by antibiotics or other medicines. Take basic precautions such as washing your hands often, covering your mouth when you cough or sneeze, and avoiding public places where you could spread your illness to others.   Please continue drinking plenty of fluids.  Use over-the-counter medicines as needed as directed on packaging for symptom relief.  You may also use ibuprofen or tylenol as directed on packaging for pain or fever.  Do not take multiple medicines containing Tylenol or acetaminophen to avoid taking too much of this medication.  Follow-up instructions: Please follow-up with your primary care provider in the next 3 days for further evaluation of your symptoms if you are not feeling better.   Return instructions:   Please return to the Emergency Department if you experience worsening symptoms.   RETURN IMMEDIATELY IF  you develop shortness of breath, confusion or altered mental status, a new rash, become dizzy, faint, or poorly responsive, or are unable to be cared for at home.  Please return if you have persistent vomiting and cannot keep down fluids or develop a fever that is not controlled by tylenol or motrin.    Please return if you have any other emergent concerns.  Additional Information:  Your vital signs today were: BP 126/78 (BP Location: Right Arm)    Pulse 98    Temp 99.3 F (37.4 C) (Oral)    Resp 20    Ht 5\' 5"  (1.651 m)    Wt 102.1 kg (225 lb)    LMP 08/04/2017    SpO2 96%    BMI 37.44 kg/m  If your blood pressure (BP) was elevated above 135/85 this visit, please have this repeated by your doctor within one month. --------------

## 2017-08-11 ENCOUNTER — Other Ambulatory Visit: Payer: Self-pay | Admitting: Family Medicine

## 2017-08-14 ENCOUNTER — Other Ambulatory Visit: Payer: Self-pay | Admitting: Family Medicine

## 2017-08-14 NOTE — Telephone Encounter (Signed)
LOV 07/13/17 PCP Dr. Randel Pigg [facility] See attached note-Hard copy was lost.

## 2017-08-14 NOTE — Telephone Encounter (Unsigned)
Copied from [place] [phone]. Topic: Quick Communication - See Telephone Encounter >> Aug 14, 2017  1:11 PM Hewitt Shorts wrote: CRM for notification. See Telephone encounter for: Quintella Reichert calling to state they lost the hard copy of vyvanse 70 mg when tranfering to the compter and needs a new hard copy   [facility] [phone]  08/14/17.

## 2017-08-15 NOTE — Telephone Encounter (Signed)
Spoke with pharmacist at [facility]. He states the Rx they are trying to process for February is dated 05/22/17 for February 2019. He states that the image was accidentally deleted from the electronic system and he was not aware that they had a written hard copy on file. He pulled the hard copy dated 05/22/17 and will fill Rx from that date. He does not have any written Rx on file from 07/13/17.  Attempted to reach pt to verify that she still has written Rxs from 07/13/17 visit and left detailed message regarding outcome and to send mychart response re: Rx status.

## 2017-11-17 ENCOUNTER — Other Ambulatory Visit: Payer: Self-pay | Admitting: Family Medicine

## 2017-11-17 NOTE — Telephone Encounter (Signed)
Copied from [place] [phone]. Topic: Quick Communication - See Telephone Encounter >> Nov 17, 2017  8:51 AM Rosalin Hawking wrote: CRM for notification. See Telephone encounter for: 11/17/17.   Pt's spouse came in office stating pt is needing refill on lisdexamfetamine (VYVANSE) 70 MG capsule and amphetamine-dextroamphetamine (ADDERALL) 10 MG tablet. Pt's spouse send a mychart message threw his account yesterday 11-16-2017 regarding about his wife rx, since pt forgot her mychart password. (pt spouse Hydia Copelin- MRN [id number]). Please advise.

## 2017-11-17 NOTE — Telephone Encounter (Signed)
Requesting:ADDERALL 10 MG Contract:12/26/16 UDS:12/26/16 Low risk Last OV:07/13/17 Next OV:01/11/18 Last Refill:05/22/17   Please advise      Requesting:VYVANSE 70 MG Contract:12/26/16 UDS:12/26/16  Low risk Last OV:07/13/17 Next OV:01/11/18 Last Refill:07/13/17   Please advise

## 2017-11-19 MED ORDER — LISDEXAMFETAMINE DIMESYLATE 70 MG PO CAPS: 70.0000 mg | ORAL_CAPSULE | Freq: Every day | ORAL | 0 refills | Status: DC

## 2017-11-19 MED ORDER — AMPHETAMINE-DEXTROAMPHETAMINE 10 MG PO TABS: 10.0000 mg | ORAL_TABLET | Freq: Every day | ORAL | 0 refills | Status: DC

## 2017-11-19 NOTE — Telephone Encounter (Signed)
I went ahead and sent in June meds please set up July and august

## 2017-11-29 ENCOUNTER — Telehealth: Payer: Self-pay | Admitting: Family Medicine

## 2017-11-29 NOTE — Telephone Encounter (Signed)
Copied from [place] [phone]. Topic: Quick Communication - See Telephone Encounter >> Nov 29, 2017  3:25 PM Genella Rife H wrote: CRM for notification. See Telephone encounter for: 11/29/17.  Left voicemail pt appt needs to be rescheduled per pcp.

## 2018-01-11 ENCOUNTER — Ambulatory Visit: Payer: Managed Care, Other (non HMO) | Admitting: Family Medicine

## 2018-02-01 ENCOUNTER — Encounter: Payer: Self-pay | Admitting: Family Medicine

## 2018-02-01 ENCOUNTER — Ambulatory Visit (INDEPENDENT_AMBULATORY_CARE_PROVIDER_SITE_OTHER): Payer: Managed Care, Other (non HMO) | Admitting: Family Medicine

## 2018-02-01 VITALS — BP 98/60 | HR 88 | Temp 98.1°F | Resp 18 | Wt 238.8 lb

## 2018-02-01 DIAGNOSIS — J45909 Unspecified asthma, uncomplicated: Secondary | ICD-10-CM

## 2018-02-01 DIAGNOSIS — G43909 Migraine, unspecified, not intractable, without status migrainosus: Secondary | ICD-10-CM

## 2018-02-01 DIAGNOSIS — F988 Other specified behavioral and emotional disorders with onset usually occurring in childhood and adolescence: Secondary | ICD-10-CM

## 2018-02-01 DIAGNOSIS — E785 Hyperlipidemia, unspecified: Secondary | ICD-10-CM | POA: Diagnosis not present

## 2018-02-01 DIAGNOSIS — E669 Obesity, unspecified: Secondary | ICD-10-CM

## 2018-02-01 DIAGNOSIS — F418 Other specified anxiety disorders: Secondary | ICD-10-CM

## 2018-02-01 DIAGNOSIS — Z0000 Encounter for general adult medical examination without abnormal findings: Secondary | ICD-10-CM

## 2018-02-01 MED ORDER — VENLAFAXINE HCL ER 75 MG PO CP24: 75.0000 mg | ORAL_CAPSULE | Freq: Three times a day (TID) | ORAL | 1 refills | Status: DC

## 2018-02-01 MED ORDER — LISDEXAMFETAMINE DIMESYLATE 30 MG PO CAPS: 30.0000 mg | ORAL_CAPSULE | Freq: Every day | ORAL | 0 refills | Status: DC

## 2018-02-01 MED ORDER — BUTALBITAL-APAP-CAFFEINE 50-300-40 MG PO CAPS: 1.0000 | ORAL_CAPSULE | Freq: Three times a day (TID) | ORAL | 1 refills | Status: DC | PRN

## 2018-02-01 NOTE — Assessment & Plan Note (Signed)
Has been off Vyvanse for a while and with school starting back she realizes she needs it back now. Will restart at 30 mg refill given

## 2018-02-01 NOTE — Assessment & Plan Note (Signed)
Encouraged increased hydration, 64 ounces of clear fluids daily. Minimize alcohol and caffeine. Eat small frequent meals with lean proteins and complex carbs. Avoid high and low blood sugars. Get adequate sleep, 7-8 hours a night. Needs exercise daily preferably in the morning.  

## 2018-02-01 NOTE — Assessment & Plan Note (Signed)
Encouraged heart healthy diet, increase exercise, avoid trans fats, consider a krill oil cap daily 

## 2018-02-01 NOTE — Patient Instructions (Signed)

## 2018-02-04 NOTE — Assessment & Plan Note (Signed)
Under a good deal of stress but managing well with her current meds. No changes.

## 2018-02-04 NOTE — Assessment & Plan Note (Signed)
Has been well controlled this year despite all of the construction in her house no changes to meds

## 2018-02-04 NOTE — Assessment & Plan Note (Deleted)
Patient encouraged to maintain heart healthy diet, regular exercise, adequate sleep. Consider daily probiotics. Take medications as prescribed 

## 2018-02-04 NOTE — Progress Notes (Addendum)
Subjective:    Patient ID: Kristina Watts, female    DOB: 1971-05-18, 47 y.o.   MRN: [id number]  No chief complaint on file.   HPI Patient is in today for follow up on chronic medical concerns including ADD, obeisty, hyperlipidemia, depression and more. She has had a stressful year with her house being under construction for much of the year as they try and make it handicap accessible for her husband's worsening MS. The house is almost done. Her father has worsening dementia and can no longer be left behind. No recent febrile illness or hospitalizations. She has returned to work as a Nurse, learning disability in [employer] and is enjoying her work. She is staying active but does not always maintain a heat healthy diet. Denies CP/palp/SOB/HA/congestion/fevers/GI or GU c/o. Taking meds as prescribed  Past Medical History:  Diagnosis Date  . Acute bronchitis 11/19/2012  . ADD (attention deficit disorder) 04/27/2012  . Allergic state 04/19/2011  . Allergy    seasonal  . Asthma   . Asthma 04/19/2011  . Back pain 11/07/2013  . Cervical cancer screening 12/23/2014  . Chicken pox as a child  . Depression   . Depression with anxiety 04/18/2011  . Dermatitis 05/18/2011  . Fatigue 05/30/2011  . GDM (gestational diabetes mellitus) 04/19/2011  . History of gestational diabetes mellitus (GDM) 04/19/2011  . History of pancreatitis 04/19/2011  . Hyperlipidemia 04/27/2012  . LUQ pain 12/12/2011  . Migraine 08/23/2011  . Otitis media of right ear 05/30/2011  . Overweight(278.02) 04/19/2011  . Pancreatitis, recurrent 12/12/2011  . Pharyngitis 08/23/2011  . Pneumonia 1998  . Preventative health care 04/19/2011  . Recurrent cold sores   . 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 Reflux 12/12/2011  . Tinea corporis 05/18/2011    Past Surgical History:  Procedure Laterality Date  . APPENDECTOMY  2007  . CESAREAN SECTION  1-02 and 4-04  . CHOLECYSTECTOMY  2000    Family History  Problem Relation Age of  Onset  . Thyroid disease Mother   . Hypertension Father   . Hyperlipidemia Father   . Heart disease Father   . Diabetes Father   . COPD Father        previous smoker  . Stroke Father   . Depression Father   . Restless legs syndrome Father   . Diabetes Brother   . Cancer Maternal Grandmother        Head and Neck  . Heart disease Paternal Grandmother   . Hyperlipidemia Paternal Grandmother   . Hypertension Paternal Grandmother   . Diabetes Paternal Grandmother   . Depression Paternal Grandmother   . Stroke Paternal Grandmother   . Restless legs syndrome Paternal Grandmother   . Heart disease Paternal Grandfather   . Hyperlipidemia Paternal Grandfather   . Hypertension Paternal Grandfather   . Stroke Paternal Grandfather   . Diabetes Paternal Grandfather     Social History   Socioeconomic History  . Marital status: Married    Spouse name: Not on file  . Number of children: Not on file  . Years of education: Not on file  . Highest education level: Not on file  Occupational History  . Not on file  Social Needs  . Financial resource strain: Not on file  . Food insecurity:    Worry: Not on file    Inability: Not on file  . Transportation needs:    Medical: Not on file    Non-medical: Not on file  Tobacco  Use  . Smoking status: Never Smoker  . Smokeless tobacco: Never Used  Substance and Sexual Activity  . Alcohol use: Yes    Comment: rarely  . Drug use: No  . Sexual activity: Yes    Partners: Male  Lifestyle  . Physical activity:    Days per week: Not on file    Minutes per session: Not on file  . Stress: Not on file  Relationships  . Social connections:    Talks on phone: Not on file    Gets together: Not on file    Attends religious service: Not on file    Active member of club or organization: Not on file    Attends meetings of clubs or organizations: Not on file    Relationship status: Not on file  . Intimate partner violence:    Fear of current or ex  partner: Not on file    Emotionally abused: Not on file    Physically abused: Not on file    Forced sexual activity: Not on file  Other Topics Concern  . Not on file  Social History Narrative  . Not on file    Outpatient Medications Prior to Visit  Medication Sig Dispense Refill  . Acetaminophen (TYLENOL EXTRA STRENGTH PO) Take 2 tablets by mouth as needed.    Marland Kitchen albuterol (ACCUNEB) 0.63 MG/3ML nebulizer solution TAKE 3 MLS (0.63 MG TOTAL) BY NEBULIZATION EVERY 6 (SIX) HOURS AS NEEDED FOR WHEEZING. 75 mL 1  . albuterol (PROVENTIL HFA;VENTOLIN HFA) 108 (90 Base) MCG/ACT inhaler Inhale 2 puffs into the lungs every 4 (four) hours as needed for wheezing or shortness of breath. 18 g 11  . amphetamine-dextroamphetamine (ADDERALL) 10 MG tablet Take 1 tablet (10 mg total) by mouth daily. February 2019 30 tablet 0  . amphetamine-dextroamphetamine (ADDERALL) 10 MG tablet Take 1 tablet (10 mg total) by mouth daily with breakfast. June 2019 30 tablet 0  . Azelastine-Fluticasone 137-50 MCG/ACT SUSP Place into the nose.    . budesonide-formoterol (SYMBICORT) 160-4.5 MCG/ACT inhaler Inhale 2 puffs into the lungs 2 (two) times daily. 10.2 Inhaler 11  . DENAVIR 1 % cream APPLY TO AFFECTED AREA TOPICALLY EVERY 2 HOURS 5 g 0  . ibuprofen (ADVIL,MOTRIN) 200 MG tablet Take 200 mg by mouth every 6 (six) hours as needed.    Marland Kitchen levocetirizine (XYZAL) 5 MG tablet Take 5 mg by mouth every evening.    . montelukast (SINGULAIR) 10 MG tablet Take 1 tablet (10 mg total) by mouth at bedtime. 90 tablet 0  . Multiple Vitamin (MULTIVITAMIN) tablet Take 1 tablet by mouth daily.    . mupirocin ointment (BACTROBAN) 2 % Place 1 application into the nose 2 (two) times daily. Via clean qtip x 7 days 22 g 1  . Triamcinolone Acetonide (TRIAMCINOLONE 0.1 % CREAM : EUCERIN) CREA Apply 1 application topically 2 (two) times daily as needed. 1 each 2  . valACYclovir (VALTREX) 1000 MG tablet TAKE 1 TABLET BY MOUTH TWICE A DAY 60 tablet 1    . Butalbital-APAP-Caffeine 50-300-40 MG CAPS Take 1 capsule by mouth 3 (three) times daily as needed. HA 84 capsule 1  . lisdexamfetamine (VYVANSE) 70 MG capsule Take 1 capsule (70 mg total) by mouth daily. March 2019 30 capsule 0  . lisdexamfetamine (VYVANSE) 70 MG capsule Take 1 capsule (70 mg total) by mouth daily. February 2019 30 capsule 0  . lisdexamfetamine (VYVANSE) 70 MG capsule Take 1 capsule (70 mg total) by mouth daily. June 2019  30 capsule 0  . venlafaxine XR (EFFEXOR-XR) 75 MG 24 hr capsule TAKE 1 CAPSULE BY MOUTH TWICE A DAY 180 capsule 1   No facility-administered medications prior to visit.     Allergies  Allergen Reactions  . Actifed [Triprolidine-Pse] Hives    Review of Systems  Constitutional: Positive for malaise/fatigue. Negative for chills and fever.  HENT: Negative for congestion and hearing loss.   Eyes: Negative for discharge.  Respiratory: Negative for cough, sputum production and shortness of breath.   Cardiovascular: Negative for chest pain, palpitations and leg swelling.  Gastrointestinal: Negative for abdominal pain, blood in stool, constipation, diarrhea, heartburn, nausea and vomiting.  Genitourinary: Negative for dysuria, frequency, hematuria and urgency.  Musculoskeletal: Positive for back pain. Negative for falls and myalgias.  Skin: Negative for rash.  Neurological: Negative for dizziness, sensory change, loss of consciousness, weakness and headaches.  Endo/Heme/Allergies: Negative for environmental allergies. Does not bruise/bleed easily.  Psychiatric/Behavioral: Positive for depression. Negative for suicidal ideas. The patient does not have insomnia.        Objective:    Physical Exam  Constitutional: She is oriented to person, place, and time. She appears well-developed and well-nourished. No distress.  HENT:  Head: Normocephalic and atraumatic.  Eyes: Conjunctivae are normal.  Neck: Neck supple. No thyromegaly present.  Cardiovascular:  Normal rate, regular rhythm and normal heart sounds.  No murmur heard. Pulmonary/Chest: Effort normal and breath sounds normal. No stridor. No respiratory distress.  Abdominal: Soft. Bowel sounds are normal. She exhibits no distension and no mass. There is no tenderness.  Musculoskeletal: She exhibits no edema.  Lymphadenopathy:    She has no cervical adenopathy.  Neurological: She is alert and oriented to person, place, and time.  Skin: Skin is warm and dry.  Psychiatric: She has a normal mood and affect. Her behavior is normal.  Nursing note and vitals reviewed.   BP 98/60 (BP Location: Left Arm, Patient Position: Sitting, Cuff Size: Normal)   Pulse 88   Temp 98.1 F (36.7 C) (Oral)   Resp 18   Wt 238 lb 12.8 oz (108.3 kg)   SpO2 98%   BMI 39.74 kg/m  Wt Readings from Last 3 Encounters:  02/01/18 238 lb 12.8 oz (108.3 kg)  08/06/17 225 lb (102.1 kg)  07/13/17 226 lb 12.8 oz (102.9 kg)     Lab Results  Component Value Date   WBC 8.7 03/28/2016   HGB 14.1 03/28/2016   HCT 41.5 03/28/2016   PLT 307.0 03/28/2016   GLUCOSE 90 03/28/2016   CHOL 178 03/28/2016   TRIG 108.0 03/28/2016   HDL 46.90 03/28/2016   LDLCALC 109 (H) 03/28/2016   ALT 26 03/28/2016   AST 21 03/28/2016   NA 140 03/28/2016   K 4.4 03/28/2016   CL 104 03/28/2016   CREATININE 0.84 03/28/2016   BUN 12 03/28/2016   CO2 28 03/28/2016   TSH 1.47 03/28/2016   HGBA1C 5.4 03/29/2016    Lab Results  Component Value Date   TSH 1.47 03/28/2016   Lab Results  Component Value Date   WBC 8.7 03/28/2016   HGB 14.1 03/28/2016   HCT 41.5 03/28/2016   MCV 89.9 03/28/2016   PLT 307.0 03/28/2016   Lab Results  Component Value Date   NA 140 03/28/2016   K 4.4 03/28/2016   CO2 28 03/28/2016   GLUCOSE 90 03/28/2016   BUN 12 03/28/2016   CREATININE 0.84 03/28/2016   BILITOT 0.4 03/28/2016   ALKPHOS 69  03/28/2016   AST 21 03/28/2016   ALT 26 03/28/2016   PROT 7.1 03/28/2016   ALBUMIN 4.3 03/28/2016     CALCIUM 9.3 03/28/2016   GFR 78.64 03/28/2016   Lab Results  Component Value Date   CHOL 178 03/28/2016   Lab Results  Component Value Date   HDL 46.90 03/28/2016   Lab Results  Component Value Date   LDLCALC 109 (H) 03/28/2016   Lab Results  Component Value Date   TRIG 108.0 03/28/2016   Lab Results  Component Value Date   CHOLHDL 4 03/28/2016   Lab Results  Component Value Date   HGBA1C 5.4 03/29/2016       Assessment & Plan:   Problem List Items Addressed This Visit    Depression with anxiety (Chronic)    Under a good deal of stress but managing well with her current meds. No changes.       Relevant Medications   venlafaxine XR (EFFEXOR-XR) 75 MG 24 hr capsule   Asthma (Chronic)    Has been well controlled this year despite all of the construction in her house no changes to meds      Obesity (BMI 30-39.9)    Encouraged DASH diet, decrease po intake and increase exercise as tolerated. Needs 7-8 hours of sleep nightly. Avoid trans fats, eat small, frequent meals every 4-5 hours with lean proteins, complex carbs and healthy fats. Minimize simple carbs      Relevant Medications   lisdexamfetamine (VYVANSE) 30 MG capsule   lisdexamfetamine (VYVANSE) 30 MG capsule   Preventative health care - Primary   Relevant Orders   CBC   Comprehensive metabolic panel   Lipid panel   TSH   Migraine    Encouraged increased hydration, 64 ounces of clear fluids daily. Minimize alcohol and caffeine. Eat small frequent meals with lean proteins and complex carbs. Avoid high and low blood sugars. Get adequate sleep, 7-8 hours a night. Needs exercise daily preferably in the morning.      Relevant Medications   venlafaxine XR (EFFEXOR-XR) 75 MG 24 hr capsule   Other Relevant Orders   CBC   Comprehensive metabolic panel   TSH   Hyperlipidemia    Encouraged heart healthy diet, increase exercise, avoid trans fats, consider a krill oil cap daily      Relevant Orders    Lipid panel   ADD (attention deficit disorder)    Has been off Vyvanse for a while and with school starting back she realizes she needs it back now. Will restart at 30 mg refill given         I have discontinued Necola A. Vessell "Ann"'s Butalbital-APAP-Caffeine, lisdexamfetamine, lisdexamfetamine, lisdexamfetamine, and Butalbital-APAP-Caffeine. I have also changed her venlafaxine XR. Additionally, I am having her start on lisdexamfetamine and lisdexamfetamine. Lastly, I am having her maintain her multivitamin, Acetaminophen (TYLENOL EXTRA STRENGTH PO), ibuprofen, mupirocin ointment, albuterol, montelukast, albuterol, valACYclovir, budesonide-formoterol, amphetamine-dextroamphetamine, DENAVIR, levocetirizine, Azelastine-Fluticasone, triamcinolone 0.1 % cream : eucerin, and amphetamine-dextroamphetamine.  Meds ordered this encounter  Medications  . DISCONTD: Butalbital-APAP-Caffeine 50-300-40 MG CAPS    Sig: Take 1 capsule by mouth 3 (three) times daily as needed. HA    Dispense:  84 capsule    Refill:  1  . venlafaxine XR (EFFEXOR-XR) 75 MG 24 hr capsule    Sig: Take 1 capsule (75 mg total) by mouth 3 (three) times daily.    Dispense:  270 capsule    Refill:  1  . lisdexamfetamine (VYVANSE)  30 MG capsule    Sig: Take 1 capsule (30 mg total) by mouth daily. September 2019    Dispense:  30 capsule    Refill:  0  . lisdexamfetamine (VYVANSE) 30 MG capsule    Sig: Take 1 capsule (30 mg total) by mouth daily. August 2019    Dispense:  30 capsule    Refill:  0     Penni Homans, MD

## 2018-02-04 NOTE — Assessment & Plan Note (Signed)
Encouraged DASH diet, decrease po intake and increase exercise as tolerated. Needs 7-8 hours of sleep nightly. Avoid trans fats, eat small, frequent meals every 4-5 hours with lean proteins, complex carbs and healthy fats. Minimize simple carbs 

## 2018-03-05 ENCOUNTER — Encounter: Payer: Self-pay | Admitting: Family

## 2018-03-05 ENCOUNTER — Ambulatory Visit (INDEPENDENT_AMBULATORY_CARE_PROVIDER_SITE_OTHER): Payer: Managed Care, Other (non HMO) | Admitting: Family

## 2018-03-05 VITALS — BP 126/84 | HR 101 | Temp 98.8°F | Resp 18 | Ht 65.0 in | Wt 240.0 lb

## 2018-03-05 DIAGNOSIS — J029 Acute pharyngitis, unspecified: Secondary | ICD-10-CM | POA: Diagnosis not present

## 2018-03-05 DIAGNOSIS — H6691 Otitis media, unspecified, right ear: Secondary | ICD-10-CM

## 2018-03-05 LAB — POCT RAPID STREP A (OFFICE): Rapid Strep A Screen: NEGATIVE

## 2018-03-05 MED ORDER — AMOXICILLIN 500 MG PO CAPS: 500.0000 mg | ORAL_CAPSULE | Freq: Three times a day (TID) | ORAL | 0 refills | Status: DC

## 2018-03-05 NOTE — Patient Instructions (Signed)
Please begin amoxicillin for your right sided ear infection. For pain you may use ibuprofen and chloraseptic spray. For cough you may use over the counter Delsym. Please call if new/worsening symptoms or if are not improved in 3 days.

## 2018-03-05 NOTE — Progress Notes (Signed)
Subjective:    Patient ID: Kristina Watts, female    DOB: [birth year], 47 y.o.   MRN: [id number]  HPI  Kristina Watts is a 47 yr old female who presents today with chief complaint of sore throat.  Reports that sore throat started 9/14 at 5PM. Started with a tickle in her throat. She reports that she developed post nasal drip.  Mild nasal congestion. ASthma symptoms have required albuterol inhaler a few times.  Had temp of 99.9 this AM.      Review of Systems    see HPI  Past Medical History:  Diagnosis Date  . Acute bronchitis 11/19/2012  . ADD (attention deficit disorder) 04/27/2012  . Allergic state 04/19/2011  . Allergy    seasonal  . Asthma   . Asthma 04/19/2011  . Back pain 11/07/2013  . Cervical cancer screening 12/23/2014  . Chicken pox as a child  . Depression   . Depression with anxiety 04/18/2011  . Dermatitis 05/18/2011  . Fatigue 05/30/2011  . GDM (gestational diabetes mellitus) 04/19/2011  . History of gestational diabetes mellitus (GDM) 04/19/2011  . History of pancreatitis 04/19/2011  . Hyperlipidemia 04/27/2012  . LUQ pain 12/12/2011  . Migraine 08/23/2011  . Otitis media of right ear 05/30/2011  . Overweight(278.02) 04/19/2011  . Pancreatitis, recurrent 12/12/2011  . Pharyngitis 08/23/2011  . Pneumonia 1998  . Preventative health care 04/19/2011  . Recurrent cold sores   . 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 Reflux 12/12/2011  . Tinea corporis 05/18/2011     Social History   Socioeconomic History  . Marital status: Married    Spouse name: Not on file  . Number of children: Not on file  . Years of education: Not on file  . Highest education level: Not on file  Occupational History  . Not on file  Social Needs  . Financial resource strain: Not on file  . Food insecurity:    Worry: Not on file    Inability: Not on file  . Transportation needs:    Medical: Not on file    Non-medical: Not on file  Tobacco Use  . Smoking status: Never  Smoker  . Smokeless tobacco: Never Used  Substance and Sexual Activity  . Alcohol use: Yes    Comment: rarely  . Drug use: No  . Sexual activity: Yes    Partners: Male  Lifestyle  . Physical activity:    Days per week: Not on file    Minutes per session: Not on file  . Stress: Not on file  Relationships  . Social connections:    Talks on phone: Not on file    Gets together: Not on file    Attends religious service: Not on file    Active member of club or organization: Not on file    Attends meetings of clubs or organizations: Not on file    Relationship status: Not on file  . Intimate partner violence:    Fear of current or ex partner: Not on file    Emotionally abused: Not on file    Physically abused: Not on file    Forced sexual activity: Not on file  Other Topics Concern  . Not on file  Social History Narrative  . Not on file    Past Surgical History:  Procedure Laterality Date  . APPENDECTOMY  2007  . CESAREAN SECTION  1-02 and 4-04  . CHOLECYSTECTOMY  2000    Family History  Problem  Relation Age of Onset  . Thyroid disease Mother   . Hypertension Father   . Hyperlipidemia Father   . Heart disease Father   . Diabetes Father   . COPD Father        previous smoker  . Stroke Father   . Depression Father   . Restless legs syndrome Father   . Diabetes Brother   . Cancer Maternal Grandmother        Head and Neck  . Heart disease Paternal Grandmother   . Hyperlipidemia Paternal Grandmother   . Hypertension Paternal Grandmother   . Diabetes Paternal Grandmother   . Depression Paternal Grandmother   . Stroke Paternal Grandmother   . Restless legs syndrome Paternal Grandmother   . Heart disease Paternal Grandfather   . Hyperlipidemia Paternal Grandfather   . Hypertension Paternal Grandfather   . Stroke Paternal Grandfather   . Diabetes Paternal Grandfather     Allergies  Allergen Reactions  . Actifed [Triprolidine-Pse] Hives    Current Outpatient  Medications on File Prior to Visit  Medication Sig Dispense Refill  . Acetaminophen (TYLENOL EXTRA STRENGTH PO) Take 2 tablets by mouth as needed.    Marland Kitchen albuterol (ACCUNEB) 0.63 MG/3ML nebulizer solution TAKE 3 MLS (0.63 MG TOTAL) BY NEBULIZATION EVERY 6 (SIX) HOURS AS NEEDED FOR WHEEZING. 75 mL 1  . albuterol (PROVENTIL HFA;VENTOLIN HFA) 108 (90 Base) MCG/ACT inhaler Inhale 2 puffs into the lungs every 4 (four) hours as needed for wheezing or shortness of breath. 18 g 11  . amphetamine-dextroamphetamine (ADDERALL) 10 MG tablet Take 1 tablet (10 mg total) by mouth daily. February 2019 30 tablet 0  . amphetamine-dextroamphetamine (ADDERALL) 10 MG tablet Take 1 tablet (10 mg total) by mouth daily with breakfast. June 2019 30 tablet 0  . Azelastine-Fluticasone 137-50 MCG/ACT SUSP Place into the nose.    . budesonide-formoterol (SYMBICORT) 160-4.5 MCG/ACT inhaler Inhale 2 puffs into the lungs 2 (two) times daily. 10.2 Inhaler 11  . DENAVIR 1 % cream APPLY TO AFFECTED AREA TOPICALLY EVERY 2 HOURS 5 g 0  . ibuprofen (ADVIL,MOTRIN) 200 MG tablet Take 200 mg by mouth every 6 (six) hours as needed.    Marland Kitchen levocetirizine (XYZAL) 5 MG tablet Take 5 mg by mouth every evening.    . lisdexamfetamine (VYVANSE) 30 MG capsule Take 1 capsule (30 mg total) by mouth daily. September 2019 30 capsule 0  . lisdexamfetamine (VYVANSE) 30 MG capsule Take 1 capsule (30 mg total) by mouth daily. August 2019 30 capsule 0  . montelukast (SINGULAIR) 10 MG tablet Take 1 tablet (10 mg total) by mouth at bedtime. 90 tablet 0  . Multiple Vitamin (MULTIVITAMIN) tablet Take 1 tablet by mouth daily.    . mupirocin ointment (BACTROBAN) 2 % Place 1 application into the nose 2 (two) times daily. Via clean qtip x 7 days 22 g 1  . Triamcinolone Acetonide (TRIAMCINOLONE 0.1 % CREAM : EUCERIN) CREA Apply 1 application topically 2 (two) times daily as needed. 1 each 2  . valACYclovir (VALTREX) 1000 MG tablet TAKE 1 TABLET BY MOUTH TWICE A DAY 60  tablet 1  . venlafaxine XR (EFFEXOR-XR) 75 MG 24 hr capsule Take 1 capsule (75 mg total) by mouth 3 (three) times daily. 270 capsule 1   No current facility-administered medications on file prior to visit.     BP 126/84 (BP Location: Left Arm, Patient Position: Sitting, Cuff Size: Large)   Pulse (!) 101   Temp 98.8 F (37.1 C) (Oral)  Resp 18   Ht 5\' 5"  (1.651 m)   Wt 240 lb (108.9 kg)   LMP 02/23/2018   SpO2 97%   BMI 39.94 kg/m    Objective:   Physical Exam  Constitutional: She appears well-developed and well-nourished.  HENT:  Head: Normocephalic.  Right Ear: Tympanic membrane is erythematous.  Left Ear: Tympanic membrane and ear canal normal.  Mouth/Throat: Posterior oropharyngeal erythema present. No oropharyngeal exudate or posterior oropharyngeal edema.  Cardiovascular: Normal rate, regular rhythm and normal heart sounds.  No murmur heard. Pulmonary/Chest: Effort normal and breath sounds normal. No respiratory distress. She has no wheezes.  Psychiatric: She has a normal mood and affect. Her behavior is normal. Judgment and thought content normal.          Assessment & Plan:  R otitis media- rapid strep negative. Rx with amoxicillin. Pt advised as follows:  For pain you may use ibuprofen and chloraseptic spray. For cough you may use over the counter Delsym. Please call if new/worsening symptoms or if are not improved in 3 days.

## 2018-03-15 ENCOUNTER — Ambulatory Visit (INDEPENDENT_AMBULATORY_CARE_PROVIDER_SITE_OTHER): Payer: Managed Care, Other (non HMO) | Admitting: Family Medicine

## 2018-03-15 DIAGNOSIS — J45909 Unspecified asthma, uncomplicated: Secondary | ICD-10-CM | POA: Diagnosis not present

## 2018-03-15 DIAGNOSIS — G43009 Migraine without aura, not intractable, without status migrainosus: Secondary | ICD-10-CM

## 2018-03-15 DIAGNOSIS — F988 Other specified behavioral and emotional disorders with onset usually occurring in childhood and adolescence: Secondary | ICD-10-CM

## 2018-03-15 DIAGNOSIS — Z23 Encounter for immunization: Secondary | ICD-10-CM | POA: Diagnosis not present

## 2018-03-15 DIAGNOSIS — L309 Dermatitis, unspecified: Secondary | ICD-10-CM | POA: Diagnosis not present

## 2018-03-15 DIAGNOSIS — H6692 Otitis media, unspecified, left ear: Secondary | ICD-10-CM | POA: Diagnosis not present

## 2018-03-15 NOTE — Progress Notes (Signed)
imm

## 2018-03-15 NOTE — Patient Instructions (Signed)
Ranitidine 300mg  = Famotidine (Pepcid) 40 mg   For the itching Famotidine/Pepcid 20 mg twice daily   Pruritus Pruritus is an itching feeling. There are many different conditions and factors that can make your skin itchy. Dry skin is one of the most common causes of itching. Most cases of itching do not require medical attention. Itchy skin can turn into a rash. Follow these instructions at home: Watch your pruritus for any changes. Take these steps to help with your condition: Skin Care  Moisturize your skin as needed. A moisturizer that contains petroleum jelly is best for keeping moisture in your skin.  Take or apply medicines only as directed by your health care provider. This may include: ? Corticosteroid cream. ? Anti-itch lotions. ? Oral anti-histamines.  Apply cool compresses to the affected areas.  Try taking a bath with: ? Epsom salts. Follow the instructions on the packaging. You can get these at your local pharmacy or grocery store. ? Baking soda. Pour a small amount into the bath as directed by your health care provider. ? Colloidal oatmeal. Follow the instructions on the packaging. You can get this at your local pharmacy or grocery store.  Try applying baking soda paste to your skin. Stir water into baking soda until it reaches a paste-like consistency.  Do not scratch your skin.  Avoid hot showers or baths, which can make itching worse. A cold shower may help with itching as long as you use a moisturizer after.  Avoid scented soaps, detergents, and perfumes. Use gentle soaps, detergents, perfumes, and other cosmetic products. General instructions  Avoid wearing tight clothes.  Keep a journal to help track what causes your itch. Write down: ? What you eat. ? What cosmetic products you use. ? What you drink. ? What you wear. This includes jewelry.  Use a humidifier. This keeps the air moist, which helps to prevent dry skin. Contact a health care provider  if:  The itching does not go away after several days.  You sweat at night.  You have weight loss.  You are unusually thirsty.  You urinate more than normal.  You are more tired than normal.  You have abdominal pain.  Your skin tingles.  You feel weak.  Your skin or the whites of your eyes look yellow (jaundice).  Your skin feels numb. This information is not intended to replace advice given to you by your health care provider. Make sure you discuss any questions you have with your health care provider. Document Released: 02/16/2011 Document Revised: 11/12/2015 Document Reviewed: 06/02/2014 Elsevier Interactive Patient Education  [employer].

## 2018-03-18 NOTE — Assessment & Plan Note (Signed)
Doing well on lower dose of Vyvanse. No concerning side effects. No changes

## 2018-03-18 NOTE — Assessment & Plan Note (Signed)
Given rx for Triamcinolone/Eucerin compound cream to use sparingly

## 2018-03-18 NOTE — Assessment & Plan Note (Signed)
Encouraged increased hydration, 64 ounces of clear fluids daily. Minimize alcohol and caffeine. Eat small frequent meals with lean proteins and complex carbs. Avoid high and low blood sugars. Get adequate sleep, 7-8 hours a night. Needs exercise daily preferably in the morning. Despite all of her stress her headaches have been manageable.

## 2018-03-18 NOTE — Progress Notes (Signed)
Subjective:    Patient ID: Kristina Watts, female    DOB: Jan 17, 1971, 47 y.o.   MRN: [id number]  No chief complaint on file.   HPI Patient is in today for follow up. She notes her ear is feeling better since she stated her antibiotic but she does still have a sense of it being clogged and somewhat itchy. No fevers or chills. 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 Is doing well on the lower dose of Vyvansae. Denies CP/palp/SOB/HA/congestion/fevers/GI or GU c/o. Taking meds as prescribed  Past Medical History:  Diagnosis Date  . Acute bronchitis 11/19/2012  . ADD (attention deficit disorder) 04/27/2012  . Allergic state 04/19/2011  . Allergy    seasonal  . Asthma   . Asthma 04/19/2011  . Back pain 11/07/2013  . Cervical cancer screening 12/23/2014  . Chicken pox as a child  . Depression   . Depression with anxiety 04/18/2011  . Dermatitis 05/18/2011  . Fatigue 05/30/2011  . GDM (gestational diabetes mellitus) 04/19/2011  . History of gestational diabetes mellitus (GDM) 04/19/2011  . History of pancreatitis 04/19/2011  . Hyperlipidemia 04/27/2012  . LUQ pain 12/12/2011  . Migraine 08/23/2011  . Otitis media of right ear 05/30/2011  . Overweight(278.02) 04/19/2011  . Pancreatitis, recurrent 12/12/2011  . Pharyngitis 08/23/2011  . Pneumonia 1998  . Preventative health care 04/19/2011  . Recurrent cold sores   . 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 Reflux 12/12/2011  . Tinea corporis 05/18/2011    Past Surgical History:  Procedure Laterality Date  . APPENDECTOMY  2007  . CESAREAN SECTION  1-02 and 4-04  . CHOLECYSTECTOMY  2000    Family History  Problem Relation Age of Onset  . Thyroid disease Mother   . Hypertension Father   . Hyperlipidemia Father   . Heart disease Father   . Diabetes Father   . COPD Father        previous smoker    . Stroke Father   . Depression Father   . Restless legs syndrome Father   . Diabetes Brother   . Cancer Maternal Grandmother        Head and Neck  . Heart disease Paternal Grandmother   . Hyperlipidemia Paternal Grandmother   . Hypertension Paternal Grandmother   . Diabetes Paternal Grandmother   . Depression Paternal Grandmother   . Stroke Paternal Grandmother   . Restless legs syndrome Paternal Grandmother   . Heart disease Paternal Grandfather   . Hyperlipidemia Paternal Grandfather   . Hypertension Paternal Grandfather   . Stroke Paternal Grandfather   . Diabetes Paternal Grandfather     Social History   Socioeconomic History  . Marital status: Married    Spouse name: Not on file  . Number of children: Not on file  . Years of education: Not on file  . Highest education level: Not on file  Occupational History  . Not on file  Social Needs  . Financial resource strain: Not on file  . Food insecurity:    Worry: Not on file    Inability: Not on file  . Transportation needs:    Medical: Not on file    Non-medical: Not on file  Tobacco Use  . Smoking status: Never Smoker  . Smokeless tobacco: Never Used  Substance and Sexual Activity  . Alcohol use:  Yes    Comment: rarely  . Drug use: No  . Sexual activity: Yes    Partners: Male  Lifestyle  . Physical activity:    Days per week: Not on file    Minutes per session: Not on file  . Stress: Not on file  Relationships  . Social connections:    Talks on phone: Not on file    Gets together: Not on file    Attends religious service: Not on file    Active member of club or organization: Not on file    Attends meetings of clubs or organizations: Not on file    Relationship status: Not on file  . Intimate partner violence:    Fear of current or ex partner: Not on file    Emotionally abused: Not on file    Physically abused: Not on file    Forced sexual activity: Not on file  Other Topics Concern  . Not on file   Social History Narrative  . Not on file    Outpatient Medications Prior to Visit  Medication Sig Dispense Refill  . Acetaminophen (TYLENOL EXTRA STRENGTH PO) Take 2 tablets by mouth as needed.    Marland Kitchen albuterol (ACCUNEB) 0.63 MG/3ML nebulizer solution TAKE 3 MLS (0.63 MG TOTAL) BY NEBULIZATION EVERY 6 (SIX) HOURS AS NEEDED FOR WHEEZING. 75 mL 1  . albuterol (PROVENTIL HFA;VENTOLIN HFA) 108 (90 Base) MCG/ACT inhaler Inhale 2 puffs into the lungs every 4 (four) hours as needed for wheezing or shortness of breath. 18 g 11  . amoxicillin (AMOXIL) 500 MG capsule Take 1 capsule (500 mg total) by mouth 3 (three) times daily. 30 capsule 0  . amphetamine-dextroamphetamine (ADDERALL) 10 MG tablet Take 1 tablet (10 mg total) by mouth daily. February 2019 30 tablet 0  . amphetamine-dextroamphetamine (ADDERALL) 10 MG tablet Take 1 tablet (10 mg total) by mouth daily with breakfast. June 2019 30 tablet 0  . Azelastine-Fluticasone 137-50 MCG/ACT SUSP Place into the nose.    . budesonide-formoterol (SYMBICORT) 160-4.5 MCG/ACT inhaler Inhale 2 puffs into the lungs 2 (two) times daily. 10.2 Inhaler 11  . DENAVIR 1 % cream APPLY TO AFFECTED AREA TOPICALLY EVERY 2 HOURS 5 g 0  . ibuprofen (ADVIL,MOTRIN) 200 MG tablet Take 200 mg by mouth every 6 (six) hours as needed.    Marland Kitchen levocetirizine (XYZAL) 5 MG tablet Take 5 mg by mouth every evening.    . lisdexamfetamine (VYVANSE) 30 MG capsule Take 1 capsule (30 mg total) by mouth daily. September 2019 30 capsule 0  . lisdexamfetamine (VYVANSE) 30 MG capsule Take 1 capsule (30 mg total) by mouth daily. August 2019 30 capsule 0  . montelukast (SINGULAIR) 10 MG tablet Take 1 tablet (10 mg total) by mouth at bedtime. 90 tablet 0  . Multiple Vitamin (MULTIVITAMIN) tablet Take 1 tablet by mouth daily.    . mupirocin ointment (BACTROBAN) 2 % Place 1 application into the nose 2 (two) times daily. Via clean qtip x 7 days 22 g 1  . Triamcinolone Acetonide (TRIAMCINOLONE 0.1 % CREAM  : EUCERIN) CREA Apply 1 application topically 2 (two) times daily as needed. 1 each 2  . valACYclovir (VALTREX) 1000 MG tablet TAKE 1 TABLET BY MOUTH TWICE A DAY 60 tablet 1  . venlafaxine XR (EFFEXOR-XR) 75 MG 24 hr capsule Take 1 capsule (75 mg total) by mouth 3 (three) times daily. 270 capsule 1   No facility-administered medications prior to visit.     Allergies  Allergen  Reactions  . Actifed [Triprolidine-Pse] Hives    Review of Systems  Constitutional: Positive for malaise/fatigue. Negative for fever.  HENT: Negative for congestion, ear discharge, ear pain and tinnitus.   Eyes: Negative for blurred vision.  Respiratory: Negative for shortness of breath.   Cardiovascular: Negative for chest pain, palpitations and leg swelling.  Gastrointestinal: Negative for abdominal pain, blood in stool and nausea.  Genitourinary: Negative for dysuria and frequency.  Musculoskeletal: Negative for falls.  Skin: Negative for rash.  Neurological: Negative for dizziness, loss of consciousness and headaches.  Endo/Heme/Allergies: Negative for environmental allergies.  Psychiatric/Behavioral: Negative for depression. The patient is nervous/anxious.        Objective:    Physical Exam  Constitutional: She is oriented to person, place, and time. She appears well-developed and well-nourished. No distress.  HENT:  Head: Normocephalic and atraumatic.  Nose: Nose normal.  Eyes: Right eye exhibits no discharge. Left eye exhibits no discharge.  Neck: Normal range of motion. Neck supple.  Cardiovascular: Normal rate and regular rhythm.  No murmur heard. Pulmonary/Chest: Effort normal and breath sounds normal.  Abdominal: Soft. Bowel sounds are normal. There is no tenderness.  Musculoskeletal: She exhibits no edema.  Neurological: She is alert and oriented to person, place, and time.  Skin: Skin is warm and dry.  Psychiatric: She has a normal mood and affect.  Nursing note and vitals  reviewed.   BP 98/70 (BP Location: Left Arm, Patient Position: Sitting, Cuff Size: Normal)   Pulse 92   Temp 98.1 F (36.7 C) (Oral)   Resp 18   Ht 5\' 5"  (1.651 m)   Wt 239 lb 12.8 oz (108.8 kg)   LMP 02/23/2018   SpO2 98%   BMI 39.90 kg/m  Wt Readings from Last 3 Encounters:  03/15/18 239 lb 12.8 oz (108.8 kg)  03/05/18 240 lb (108.9 kg)  02/01/18 238 lb 12.8 oz (108.3 kg)     Lab Results  Component Value Date   WBC 8.7 03/28/2016   HGB 14.1 03/28/2016   HCT 41.5 03/28/2016   PLT 307.0 03/28/2016   GLUCOSE 90 03/28/2016   CHOL 178 03/28/2016   TRIG 108.0 03/28/2016   HDL 46.90 03/28/2016   LDLCALC 109 (H) 03/28/2016   ALT 26 03/28/2016   AST 21 03/28/2016   NA 140 03/28/2016   K 4.4 03/28/2016   CL 104 03/28/2016   CREATININE 0.84 03/28/2016   BUN 12 03/28/2016   CO2 28 03/28/2016   TSH 1.47 03/28/2016   HGBA1C 5.4 03/29/2016    Lab Results  Component Value Date   TSH 1.47 03/28/2016   Lab Results  Component Value Date   WBC 8.7 03/28/2016   HGB 14.1 03/28/2016   HCT 41.5 03/28/2016   MCV 89.9 03/28/2016   PLT 307.0 03/28/2016   Lab Results  Component Value Date   NA 140 03/28/2016   K 4.4 03/28/2016   CO2 28 03/28/2016   GLUCOSE 90 03/28/2016   BUN 12 03/28/2016   CREATININE 0.84 03/28/2016   BILITOT 0.4 03/28/2016   ALKPHOS 69 03/28/2016   AST 21 03/28/2016   ALT 26 03/28/2016   PROT 7.1 03/28/2016   ALBUMIN 4.3 03/28/2016   CALCIUM 9.3 03/28/2016   GFR 78.64 03/28/2016   Lab Results  Component Value Date   CHOL 178 03/28/2016   Lab Results  Component Value Date   HDL 46.90 03/28/2016   Lab Results  Component Value Date   LDLCALC 109 (H) 03/28/2016  Lab Results  Component Value Date   TRIG 108.0 03/28/2016   Lab Results  Component Value Date   CHOLHDL 4 03/28/2016   Lab Results  Component Value Date   HGBA1C 5.4 03/29/2016       Assessment & Plan:   Problem List Items Addressed This Visit    Asthma (Chronic)     No recent exacerbation and doing well on Symbicort.       Dermatitis    Given rx for Triamcinolone/Eucerin compound cream to use sparingly      Otitis media of left ear    Improving with treatment. Notes some mild itching. Can try a couple of drops of H2O2 after shower. Notify us if wosens.       Migraine    Encouraged increased hydration, 64 ounces of clear fluids daily. Minimize alcohol and caffeine. Eat small frequent meals with lean proteins and complex carbs. Avoid high and low blood sugars. Get adequate sleep, 7-8 hours a night. Needs exercise daily preferably in the morning. Despite all of her stress her headaches have been manageable.      ADD (attention deficit disorder)    Doing well on lower dose of Vyvanse. No concerning side effects. No changes         I am having Karinna A. Birge "Ann" maintain her multivitamin, Acetaminophen (TYLENOL EXTRA STRENGTH PO), ibuprofen, mupirocin ointment, albuterol, montelukast, albuterol, valACYclovir, budesonide-formoterol, amphetamine-dextroamphetamine, DENAVIR, levocetirizine, Azelastine-Fluticasone, triamcinolone 0.1 % cream : eucerin, amphetamine-dextroamphetamine, venlafaxine XR, lisdexamfetamine, lisdexamfetamine, and amoxicillin.  No orders of the defined types were placed in this encounter.    Penni Homans, MD

## 2018-03-18 NOTE — Assessment & Plan Note (Signed)
No recent exacerbation and doing well on Symbicort.

## 2018-03-18 NOTE — Assessment & Plan Note (Signed)
Improving with treatment. Notes some mild itching. Can try a couple of drops of H2O2 after shower. Notify us if wosens.

## 2018-03-22 ENCOUNTER — Other Ambulatory Visit: Payer: Self-pay | Admitting: Family Medicine

## 2018-04-27 ENCOUNTER — Encounter: Payer: Self-pay | Admitting: Family Medicine

## 2018-04-30 MED ORDER — LISDEXAMFETAMINE DIMESYLATE 30 MG PO CAPS: 30.0000 mg | ORAL_CAPSULE | Freq: Every day | ORAL | 0 refills | Status: DC

## 2018-04-30 MED ORDER — ALBUTEROL SULFATE HFA 108 (90 BASE) MCG/ACT IN AERS: 2.0000 | INHALATION_SPRAY | RESPIRATORY_TRACT | 1 refills | Status: DC | PRN

## 2018-04-30 NOTE — Telephone Encounter (Signed)
Last Vyvanse 30mg  x 2 RX: 02/01/18. Last OV: 03/15/18 Next OV: 08/06/18 UDS: 12/25/16, low risk. PAST DUE CSC: 12/25/16. Needs updating CSR: I do not have access at this time.

## 2018-07-04 ENCOUNTER — Encounter (HOSPITAL_BASED_OUTPATIENT_CLINIC_OR_DEPARTMENT_OTHER): Payer: Self-pay

## 2018-07-04 ENCOUNTER — Emergency Department (HOSPITAL_BASED_OUTPATIENT_CLINIC_OR_DEPARTMENT_OTHER)
Admission: EM | Admit: 2018-07-04 | Discharge: 2018-07-05 | Disposition: A | Discharge status: Home / Self Care | Payer: Managed Care, Other (non HMO) | Attending: Emergency Medicine | Admitting: Emergency Medicine

## 2018-07-04 ENCOUNTER — Other Ambulatory Visit: Payer: Self-pay

## 2018-07-04 DIAGNOSIS — Z79899 Other long term (current) drug therapy: Secondary | ICD-10-CM | POA: Diagnosis not present

## 2018-07-04 DIAGNOSIS — R51 Headache: Secondary | ICD-10-CM | POA: Diagnosis not present

## 2018-07-04 DIAGNOSIS — J45909 Unspecified asthma, uncomplicated: Secondary | ICD-10-CM | POA: Diagnosis not present

## 2018-07-04 DIAGNOSIS — F329 Major depressive disorder, single episode, unspecified: Secondary | ICD-10-CM | POA: Diagnosis not present

## 2018-07-04 DIAGNOSIS — Z9049 Acquired absence of other specified parts of digestive tract: Secondary | ICD-10-CM | POA: Insufficient documentation

## 2018-07-04 DIAGNOSIS — F419 Anxiety disorder, unspecified: Secondary | ICD-10-CM | POA: Diagnosis not present

## 2018-07-04 DIAGNOSIS — R112 Nausea with vomiting, unspecified: Secondary | ICD-10-CM | POA: Diagnosis present

## 2018-07-04 DIAGNOSIS — R519 Headache, unspecified: Secondary | ICD-10-CM

## 2018-07-04 MED ORDER — DIPHENHYDRAMINE HCL 50 MG/ML IJ SOLN
25.0000 mg | Freq: Once | INTRAMUSCULAR | Status: AC
  Administered 2018-07-05: 25 mg via INTRAVENOUS
  Filled 2018-07-04: qty 1

## 2018-07-04 MED ORDER — METOCLOPRAMIDE HCL 5 MG/ML IJ SOLN
10.0000 mg | Freq: Once | INTRAMUSCULAR | Status: AC
  Administered 2018-07-05: 10 mg via INTRAVENOUS
  Filled 2018-07-04: qty 2

## 2018-07-04 MED ORDER — SODIUM CHLORIDE 0.9 % IV BOLUS
1000.0000 mL | Freq: Once | INTRAVENOUS | Status: AC
  Administered 2018-07-05: 1000 mL via INTRAVENOUS

## 2018-07-04 MED ORDER — KETOROLAC TROMETHAMINE 15 MG/ML IJ SOLN
15.0000 mg | Freq: Once | INTRAMUSCULAR | Status: AC
  Administered 2018-07-05: 15 mg via INTRAVENOUS
  Filled 2018-07-04: qty 1

## 2018-07-04 NOTE — ED Provider Notes (Signed)
[facility] Provider Note: Georgena Spurling, MD, FACEP  CSN: [id number] MRN: [id number] ARRIVAL: 07/04/18 at 2319 ROOM: [place]  Headache   HISTORY OF PRESENT ILLNESS  07/04/18 11:35 PM Kristina Watts is a 48 y.o. female with a history of migraines.  She is here with nausea and vomiting x1 that occurred about 5 PM.  After an emotional upset she developed a severe headache about 9 PM.  The headache initially was in the frontal and occipital regions but is now located primarily around the eyes.  It was a 10 out of 10 at its worst, 7 out of 10 now.  She states it is different than previous migraines and that it is bilateral.  There is associated photophobia or nausea and 2 more episodes of vomiting.  She denies abdominal pain or dysuria.   Past Medical History:  Diagnosis Date  . Acute bronchitis 11/19/2012  . ADD (attention deficit disorder) 04/27/2012  . Allergy    seasonal  . Asthma   . Back pain 11/07/2013  . Cervical cancer screening 12/23/2014  . Chicken pox as a child  . Depression   . Depression with anxiety 04/18/2011  . Dermatitis 05/18/2011  . Fatigue 05/30/2011  . GDM (gestational diabetes mellitus) 04/19/2011  . History of gestational diabetes mellitus (GDM) 04/19/2011  . History of pancreatitis 04/19/2011  . Hyperlipidemia 04/27/2012  . LUQ pain 12/12/2011  . Migraine 08/23/2011  . Otitis media of right ear 05/30/2011  . Overweight(278.02) 04/19/2011  . Pancreatitis, recurrent 12/12/2011  . Pharyngitis 08/23/2011  . Pneumonia 1998  . Preventative health care 04/19/2011  . Recurrent cold sores   . 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 Reflux 12/12/2011  . Tinea corporis 05/18/2011    Past Surgical History:  Procedure Laterality Date  . APPENDECTOMY  2007  . CESAREAN SECTION  1-02 and 4-04  . CHOLECYSTECTOMY  2000    Family History  Problem Relation Age of Onset  . Thyroid disease Mother   . Hypertension Father   .  Hyperlipidemia Father   . Heart disease Father   . Diabetes Father   . COPD Father        previous smoker  . Stroke Father   . Depression Father   . Restless legs syndrome Father   . Diabetes Brother   . Cancer Maternal Grandmother        Head and Neck  . Heart disease Paternal Grandmother   . Hyperlipidemia Paternal Grandmother   . Hypertension Paternal Grandmother   . Diabetes Paternal Grandmother   . Depression Paternal Grandmother   . Stroke Paternal Grandmother   . Restless legs syndrome Paternal Grandmother   . Heart disease Paternal Grandfather   . Hyperlipidemia Paternal Grandfather   . Hypertension Paternal Grandfather   . Stroke Paternal Grandfather   . Diabetes Paternal Grandfather     Social History   Tobacco Use  . Smoking status: Never Smoker  . Smokeless tobacco: Never Used  Substance Use Topics  . Alcohol use: Yes    Comment: rarely  . Drug use: No    Prior to Admission medications   Medication Sig Start Date End Date Taking? Authorizing Provider  Acetaminophen (TYLENOL EXTRA STRENGTH PO) Take 2 tablets by mouth as needed.    [provider]  albuterol (ACCUNEB) 0.63 MG/3ML nebulizer solution TAKE 3 MLS (0.63 MG TOTAL) BY NEBULIZATION EVERY 6 (SIX) HOURS AS NEEDED FOR WHEEZING. 04/14/16   Charlett Blake,  Bonnita Levan, MD  albuterol (PROVENTIL HFA;VENTOLIN HFA) 108 (90 Base) MCG/ACT inhaler Inhale 2 puffs into the lungs every 4 (four) hours as needed for wheezing or shortness of breath. 04/30/18   [name], MD  amoxicillin (AMOXIL) 500 MG capsule Take 1 capsule (500 mg total) by mouth 3 (three) times daily. 03/05/18   Debbrah Alar, NP  amphetamine-dextroamphetamine (ADDERALL) 10 MG tablet Take 1 tablet (10 mg total) by mouth daily. February 2019 05/22/17   [name], MD  amphetamine-dextroamphetamine (ADDERALL) 10 MG tablet Take 1 tablet (10 mg total) by mouth daily with breakfast. June 2019 11/19/17   [name], MD  Azelastine-Fluticasone  137-50 MCG/ACT SUSP Place into the nose.    [provider]  DENAVIR 1 % cream APPLY TO AFFECTED AREA TOPICALLY EVERY 2 HOURS 06/05/17   [name], MD  ibuprofen (ADVIL,MOTRIN) 200 MG tablet Take 200 mg by mouth every 6 (six) hours as needed.    [provider]  levocetirizine (XYZAL) 5 MG tablet Take 5 mg by mouth every evening.    [provider]  lisdexamfetamine (VYVANSE) 30 MG capsule Take 1 capsule (30 mg total) by mouth daily. September 2019 04/30/18   [name], MD  lisdexamfetamine (VYVANSE) 30 MG capsule Take 1 capsule (30 mg total) by mouth daily. August 2019 04/30/18   [name], MD  montelukast (SINGULAIR) 10 MG tablet Take 1 tablet (10 mg total) by mouth at bedtime. 02/11/16   [name], MD  Multiple Vitamin (MULTIVITAMIN) tablet Take 1 tablet by mouth daily.    [provider]  mupirocin ointment (BACTROBAN) 2 % Place 1 application into the nose 2 (two) times daily. Via clean qtip x 7 days 05/14/13   [name], MD  SYMBICORT 160-4.5 MCG/ACT inhaler TAKE 2 PUFFS BY MOUTH TWICE A DAY 03/22/18   [name], MD  Triamcinolone Acetonide (TRIAMCINOLONE 0.1 % CREAM : EUCERIN) CREA Apply 1 application topically 2 (two) times daily as needed. 07/13/17   [name], MD  valACYclovir (VALTREX) 1000 MG tablet TAKE 1 TABLET BY MOUTH TWICE A DAY 11/01/16   [name], MD  venlafaxine XR (EFFEXOR-XR) 75 MG 24 hr capsule Take 1 capsule (75 mg total) by mouth 3 (three) times daily. 02/01/18   [name], MD    Allergies Actifed [triprolidine-pse]   REVIEW OF SYSTEMS  Negative except as noted here or in the History of Present Illness.   PHYSICAL EXAMINATION  Initial Vital Signs Blood pressure 125/67, pulse 73, temperature 97.8 F (36.6 C), temperature source Oral, resp. rate 18, height 5\' 5"  (1.651 m), weight 104.3 kg, last menstrual period 06/23/2018, SpO2 100 %.  Examination General: Well-developed,  well-nourished female in no acute distress; appearance consistent with age of record HENT: normocephalic; atraumatic Eyes: pupils equal, round and reactive to light; extraocular muscles intact; photophobia Neck: supple Heart: regular rate and rhythm Lungs: clear to auscultation bilaterally Abdomen: soft; nondistended; nontender; bowel sounds present Extremities: No deformity; full range of motion; pulses normal Neurologic: Awake, alert and oriented; motor function intact in all extremities and symmetric; no facial droop Skin: Warm and dry Psychiatric: Normal mood and affect   RESULTS  Summary of this visit's results, reviewed by myself:   EKG Interpretation  Date/Time:    Ventricular Rate:    PR Interval:    QRS Duration:   QT Interval:    QTC Calculation:   R Axis:  Text Interpretation:        Laboratory Studies: Results for orders placed or performed during the hospital encounter of 07/04/18 (from the past 24 hour(s))  Pregnancy, urine     Status: None   Collection Time: 07/04/18 11:42 PM  Result Value Ref Range   Preg Test, Ur NEGATIVE NEGATIVE   Imaging Studies: No results found.  ED COURSE and MDM  Nursing notes and initial vitals signs, including pulse oximetry, reviewed.  Vitals:   07/04/18 2329  BP: 125/67  Pulse: 73  Resp: 18  Temp: 97.8 F (36.6 C)  TempSrc: Oral  SpO2: 100%  Weight: 104.3 kg  Height: 5\' 5"  (1.651 m)   1:18 AM Patient feeling much better after IV fluids and medications.  States she is ready to go home.  PROCEDURES    ED DIAGNOSES     ICD-10-CM   1. Acute headache with normal neurologic examination R51        Psalm Schappell, MD 07/05/18 0120

## 2018-07-04 NOTE — ED Triage Notes (Signed)
Pt c/o vomiting and headache with light sensitivity that started tonight at 1800, attempted to take tylenol but vomited it up

## 2018-07-05 ENCOUNTER — Other Ambulatory Visit: Payer: Self-pay | Admitting: Family Medicine

## 2018-07-05 ENCOUNTER — Other Ambulatory Visit: Payer: Self-pay

## 2018-07-05 LAB — PREGNANCY, URINE: Preg Test, Ur: NEGATIVE

## 2018-07-05 MED ORDER — BUTALBITAL-ACETAMINOPHEN 50-300 MG PO TABS: 1.0000 | ORAL_TABLET | Freq: Three times a day (TID) | ORAL | 1 refills | Status: DC | PRN

## 2018-07-05 MED ORDER — PROMETHAZINE HCL 25 MG PO TABS: 25.0000 mg | ORAL_TABLET | Freq: Three times a day (TID) | ORAL | 1 refills | Status: AC | PRN

## 2018-07-05 NOTE — Telephone Encounter (Signed)
Please advise 

## 2018-07-05 NOTE — Telephone Encounter (Signed)
I sent in Kristina Watts for pain to use up to 3 x a day and some phenergan for nausea they may make her sleepy. She should seek care again if this worsens. Encouraged increased hydration, 64 ounces of clear fluids daily. Minimize alcohol and caffeine. Eat small frequent meals with lean proteins and complex carbs. Avoid high and low blood sugars. Get adequate sleep, 7-8 hours a night. Needs exercise daily preferably in the morning.

## 2018-07-05 NOTE — Telephone Encounter (Signed)
Copied from [facility] [id number]. Topic: General - Other >> Jul 05, 2018 12:04 PM Oneta Rack wrote: Relation to pt: spouse, Arteaga,David Call back number: [phone]    Reason for call:  Patient was seen 07/04/2018 at [facility] for severe migraines,  nausea and vomiting. Patient requesting pain and nase[place] medication do to symptoms not improving. Patient scheduled with Percell Miller for  07/11/2018 at 3:20pm. Please advise

## 2018-07-06 ENCOUNTER — Other Ambulatory Visit: Payer: Self-pay | Admitting: Family Medicine

## 2018-07-06 MED ORDER — BUTALBITAL-ACETAMINOPHEN 50-325 MG PO TABS: 1.0000 | ORAL_TABLET | Freq: Two times a day (BID) | ORAL | 1 refills | Status: DC

## 2018-07-06 MED ORDER — BUTALBITAL-APAP 50-325 MG PO TABS: 1.0000 | ORAL_TABLET | Freq: Three times a day (TID) | ORAL | 0 refills | Status: AC | PRN

## 2018-07-06 NOTE — Telephone Encounter (Signed)
Patient notified she voiced her understanding 

## 2018-07-06 NOTE — Telephone Encounter (Signed)
Dr. Charlett Blake can you resend this medication its cheaper in this dose

## 2018-07-06 NOTE — Telephone Encounter (Addendum)
Patient states Butalbital-Acetaminophen 50-300 MG TABS cost $650, patient requesting new rX reflecting butalbital-acetaminophen 50 mg-325 mg tablet costing $50, please advise

## 2018-07-06 NOTE — Addendum Note (Signed)
Addended by: Magdalene Molly A on: 07/06/2018 01:47 PM   Modules accepted: Orders

## 2018-07-11 ENCOUNTER — Inpatient Hospital Stay: Payer: Self-pay | Admitting: Medical

## 2018-08-02 ENCOUNTER — Other Ambulatory Visit: Payer: Self-pay

## 2018-08-06 ENCOUNTER — Encounter: Payer: Self-pay | Admitting: Family Medicine

## 2018-08-17 ENCOUNTER — Other Ambulatory Visit: Payer: Self-pay | Admitting: Family Medicine

## 2018-09-03 ENCOUNTER — Encounter: Payer: Self-pay | Admitting: Family Medicine

## 2018-09-11 ENCOUNTER — Other Ambulatory Visit: Payer: Self-pay

## 2018-09-11 MED ORDER — ALBUTEROL SULFATE 0.63 MG/3ML IN NEBU: 1.0000 | INHALATION_SOLUTION | Freq: Four times a day (QID) | RESPIRATORY_TRACT | 3 refills | Status: DC | PRN

## 2018-10-08 ENCOUNTER — Encounter: Payer: Self-pay | Admitting: Family Medicine

## 2018-12-23 ENCOUNTER — Encounter: Payer: Self-pay | Admitting: Family Medicine

## 2018-12-24 ENCOUNTER — Other Ambulatory Visit: Payer: Self-pay

## 2018-12-24 ENCOUNTER — Ambulatory Visit (INDEPENDENT_AMBULATORY_CARE_PROVIDER_SITE_OTHER): Payer: Managed Care, Other (non HMO) | Admitting: Internal Medicine

## 2018-12-24 DIAGNOSIS — R21 Rash and other nonspecific skin eruption: Secondary | ICD-10-CM | POA: Diagnosis not present

## 2018-12-24 MED ORDER — KETOCONAZOLE 2 % EX CREA: 1.0000 "application " | TOPICAL_CREAM | Freq: Every day | CUTANEOUS | 0 refills | Status: DC

## 2018-12-24 MED ORDER — DOXYCYCLINE HYCLATE 100 MG PO TABS: 100.0000 mg | ORAL_TABLET | Freq: Two times a day (BID) | ORAL | 0 refills | Status: DC

## 2018-12-24 NOTE — Progress Notes (Signed)
Subjective:    Patient ID: Kristina Watts, female    DOB: 05/20/1971, 48 y.o.   MRN: [id number]  DOS:  12/24/2018 Type of visit - description: Virtual Visit via Video Note  I connected with@ on 12/25/18 at  3:00 PM EDT by a video enabled telemedicine application and verified that I am speaking with the correct person using two identifiers.   THIS ENCOUNTER IS A VIRTUAL VISIT DUE TO COVID-19 - PATIENT WAS NOT SEEN IN THE OFFICE. PATIENT HAS CONSENTED TO VIRTUAL VISIT / TELEMEDICINE VISIT   Location of patient: home  Location of provider: office  I discussed the limitations of evaluation and management by telemedicine and the availability of in person appointments. The patient expressed understanding and agreed to proceed.  History of Present Illness: Acute Approximately 8 days ago, after she went outdoors noted a mark of a bite, she felt that was probably a tick bite but she never saw a tick  In the following days, she noticed that a ring of white skin at that area surrounded by a ring of red skin and now she is concerned she may have tick borne disease. + itching. She  sent a picture, the quality is poor, it really does not look much like a bull's-eye on the picture.   Review of Systems No fever chills. No fatigue No other rash Had mild right hip pain today  Past Medical History:  Diagnosis Date  . Acute bronchitis 11/19/2012  . ADD (attention deficit disorder) 04/27/2012  . Allergy    seasonal  . Asthma   . Back pain 11/07/2013  . Cervical cancer screening 12/23/2014  . Chicken pox as a child  . Depression   . Depression with anxiety 04/18/2011  . Dermatitis 05/18/2011  . Fatigue 05/30/2011  . GDM (gestational diabetes mellitus) 04/19/2011  . History of gestational diabetes mellitus (GDM) 04/19/2011  . History of pancreatitis 04/19/2011  . Hyperlipidemia 04/27/2012  . LUQ pain 12/12/2011  . Migraine 08/23/2011  . Otitis media of right ear 05/30/2011  . Overweight(278.02)  04/19/2011  . Pancreatitis, recurrent 12/12/2011  . Pharyngitis 08/23/2011  . Pneumonia 1998  . Preventative health care 04/19/2011  . Recurrent cold sores   . 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 Reflux 12/12/2011  . Tinea corporis 05/18/2011    Past Surgical History:  Procedure Laterality Date  . APPENDECTOMY  2007  . CESAREAN SECTION  1-02 and 4-04  . CHOLECYSTECTOMY  2000    Social History   Socioeconomic History  . Marital status: Married    Spouse name: Not on file  . Number of children: Not on file  . Years of education: Not on file  . Highest education level: Not on file  Occupational History  . Not on file  Social Needs  . Financial resource strain: Not on file  . Food insecurity    Worry: Not on file    Inability: Not on file  . Transportation needs    Medical: Not on file    Non-medical: Not on file  Tobacco Use  . Smoking status: Never Smoker  . Smokeless tobacco: Never Used  Substance and Sexual Activity  . Alcohol use: Yes    Comment: rarely  . Drug use: No  . Sexual activity: Yes    Partners: Male  Lifestyle  . Physical activity    Days per week: Not on file    Minutes per session: Not on file  . Stress: Not on  file  Relationships  . Social Herbalist on phone: Not on file    Gets together: Not on file    Attends religious service: Not on file    Active member of club or organization: Not on file    Attends meetings of clubs or organizations: Not on file    Relationship status: Not on file  . Intimate partner violence    Fear of current or ex partner: Not on file    Emotionally abused: Not on file    Physically abused: Not on file    Forced sexual activity: Not on file  Other Topics Concern  . Not on file  Social History Narrative  . Not on file      Allergies as of 12/24/2018      Reactions   Actifed [triprolidine-pse] Hives      Medication List       Accurate as of December 24, 2018 11:59 PM. If you have any  questions, ask your nurse or doctor.        STOP taking these medications   amoxicillin 500 MG capsule Commonly known as: AMOXIL Stopped by: Kathlene November, MD   mupirocin ointment 2 % Commonly known as: Bactroban Stopped by: Kathlene November, MD   triamcinolone 0.1 % cream : eucerin Crea Stopped by: Kathlene November, MD     TAKE these medications   albuterol 108 (90 Base) MCG/ACT inhaler Commonly known as: VENTOLIN HFA Inhale 2 puffs into the lungs every 4 (four) hours as needed for wheezing or shortness of breath.   albuterol 0.63 MG/3ML nebulizer solution Commonly known as: ACCUNEB Take 3 mLs (0.63 mg total) by nebulization every 6 (six) hours as needed for wheezing.   amphetamine-dextroamphetamine 10 MG tablet Commonly known as: Adderall Take 1 tablet (10 mg total) by mouth daily. February 2019   amphetamine-dextroamphetamine 10 MG tablet Commonly known as: Adderall Take 1 tablet (10 mg total) by mouth daily with breakfast. June 2019   Azelastine-Fluticasone 137-50 MCG/ACT Susp Place into the nose.   Butalbital-APAP 50-325 MG Tabs Take 1 tablet by mouth 3 (three) times daily as needed.   Denavir 1 % cream Generic drug: penciclovir APPLY TO AFFECTED AREA TOPICALLY EVERY 2 HOURS   doxycycline 100 MG tablet Commonly known as: VIBRA-TABS Take 1 tablet (100 mg total) by mouth 2 (two) times daily. Started by: Kathlene November, MD   ibuprofen 200 MG tablet Commonly known as: ADVIL Take 200 mg by mouth every 6 (six) hours as needed.   ketoconazole 2 % cream Commonly known as: NIZORAL Apply 1 application topically daily. Started by: Kathlene November, MD   levocetirizine 5 MG tablet Commonly known as: XYZAL Take 5 mg by mouth every evening.   lisdexamfetamine 30 MG capsule Commonly known as: Vyvanse Take 1 capsule (30 mg total) by mouth daily. September 2019   lisdexamfetamine 30 MG capsule Commonly known as: Vyvanse Take 1 capsule (30 mg total) by mouth daily. August 2019   montelukast 10  MG tablet Commonly known as: SINGULAIR Take 1 tablet (10 mg total) by mouth at bedtime.   multivitamin tablet Take 1 tablet by mouth daily.   promethazine 25 MG tablet Commonly known as: PHENERGAN Take 1 tablet (25 mg total) by mouth every 8 (eight) hours as needed for nausea or vomiting.   Symbicort 160-4.5 MCG/ACT inhaler Generic drug: budesonide-formoterol TAKE 2 PUFFS BY MOUTH TWICE A DAY   TYLENOL EXTRA STRENGTH PO Take 2 tablets by mouth as  needed.   valACYclovir 1000 MG tablet Commonly known as: VALTREX TAKE 1 TABLET BY MOUTH TWICE A DAY   venlafaxine XR 75 MG 24 hr capsule Commonly known as: EFFEXOR-XR TAKE 1 CAPSULE (75 MG TOTAL) BY MOUTH 3 (THREE) TIMES DAILY.           Objective:   Physical Exam There were no vitals taken for this visit. Patient is alert oriented x3, no apparent distress.    See picture in "media"    Assessment      48 year old lady PMH includes  ADD, asthma, GERD, anxiety depression, birth control: husband vasectomy presents with  Rash: The patient suspect the rash is related to a tick bite, that is not clear to me, see picture. DDX includes a ringworm, cellulitis, and less likely early Lyme disease. The patient knows the limitations of a virtual visit and since I am not sure I recommend the following: Doxycycline x1 week Nizoral cream twice a day Call if not better. She verbalized understanding and agreed with the plan   I discussed the assessment and treatment plan with the patient. The patient was provided an opportunity to ask questions and all were answered. The patient agreed with the plan and demonstrated an understanding of the instructions.   The patient was advised to call back or seek an in-person evaluation if the symptoms worsen or if the condition fails to improve as anticipated.

## 2019-01-17 ENCOUNTER — Ambulatory Visit (INDEPENDENT_AMBULATORY_CARE_PROVIDER_SITE_OTHER): Payer: Managed Care, Other (non HMO) | Admitting: Family Medicine

## 2019-01-17 ENCOUNTER — Other Ambulatory Visit: Payer: Self-pay

## 2019-01-17 ENCOUNTER — Encounter: Payer: Self-pay | Admitting: Family Medicine

## 2019-01-17 VITALS — BP 100/72 | HR 92 | Temp 98.2°F | Resp 18 | Wt 247.4 lb

## 2019-01-17 DIAGNOSIS — W57XXXS Bitten or stung by nonvenomous insect and other nonvenomous arthropods, sequela: Secondary | ICD-10-CM

## 2019-01-17 DIAGNOSIS — K219 Gastro-esophageal reflux disease without esophagitis: Secondary | ICD-10-CM | POA: Diagnosis not present

## 2019-01-17 DIAGNOSIS — Z0000 Encounter for general adult medical examination without abnormal findings: Secondary | ICD-10-CM | POA: Diagnosis not present

## 2019-01-17 DIAGNOSIS — R197 Diarrhea, unspecified: Secondary | ICD-10-CM

## 2019-01-17 DIAGNOSIS — R739 Hyperglycemia, unspecified: Secondary | ICD-10-CM | POA: Diagnosis not present

## 2019-01-17 DIAGNOSIS — G43009 Migraine without aura, not intractable, without status migrainosus: Secondary | ICD-10-CM

## 2019-01-17 DIAGNOSIS — Z1239 Encounter for other screening for malignant neoplasm of breast: Secondary | ICD-10-CM | POA: Diagnosis not present

## 2019-01-17 DIAGNOSIS — Z8719 Personal history of other diseases of the digestive system: Secondary | ICD-10-CM | POA: Diagnosis not present

## 2019-01-17 DIAGNOSIS — F988 Other specified behavioral and emotional disorders with onset usually occurring in childhood and adolescence: Secondary | ICD-10-CM

## 2019-01-17 DIAGNOSIS — R21 Rash and other nonspecific skin eruption: Secondary | ICD-10-CM

## 2019-01-17 DIAGNOSIS — E669 Obesity, unspecified: Secondary | ICD-10-CM

## 2019-01-17 DIAGNOSIS — J45909 Unspecified asthma, uncomplicated: Secondary | ICD-10-CM

## 2019-01-17 DIAGNOSIS — R1013 Epigastric pain: Secondary | ICD-10-CM

## 2019-01-17 DIAGNOSIS — F418 Other specified anxiety disorders: Secondary | ICD-10-CM

## 2019-01-17 DIAGNOSIS — R109 Unspecified abdominal pain: Secondary | ICD-10-CM | POA: Diagnosis not present

## 2019-01-17 DIAGNOSIS — E785 Hyperlipidemia, unspecified: Secondary | ICD-10-CM | POA: Diagnosis not present

## 2019-01-17 DIAGNOSIS — E119 Type 2 diabetes mellitus without complications: Secondary | ICD-10-CM

## 2019-01-17 LAB — COMPREHENSIVE METABOLIC PANEL
ALT: 52 U/L — ABNORMAL HIGH (ref 0–35)
AST: 38 U/L — ABNORMAL HIGH (ref 0–37)
Albumin: 4.6 g/dL (ref 3.5–5.2)
Alkaline Phosphatase: 81 U/L (ref 39–117)
BUN: 11 mg/dL (ref 6–23)
CO2: 27 mEq/L (ref 19–32)
Calcium: 9.5 mg/dL (ref 8.4–10.5)
Chloride: 101 mEq/L (ref 96–112)
Creatinine, Ser: 0.69 mg/dL (ref 0.40–1.20)
GFR: 91.66 mL/min (ref >60.00)
Glucose, Bld: 201 mg/dL — ABNORMAL HIGH (ref 70–99)
Potassium: 4.3 mEq/L (ref 3.5–5.1)
Sodium: 137 mEq/L (ref 135–145)
Total Bilirubin: 0.6 mg/dL (ref 0.2–1.2)
Total Protein: 7.1 g/dL (ref 6.0–8.3)

## 2019-01-17 LAB — LIPID PANEL
Cholesterol: 186 mg/dL (ref 0–200)
HDL: 42.1 mg/dL (ref >39.00)
NonHDL: 144.16
Total CHOL/HDL Ratio: 4
Triglycerides: 203 mg/dL — ABNORMAL HIGH (ref 0.0–149.0)
VLDL: 40.6 mg/dL — ABNORMAL HIGH (ref 0.0–40.0)

## 2019-01-17 LAB — H. PYLORI ANTIBODY, IGG: H Pylori IgG: NEGATIVE

## 2019-01-17 LAB — CBC
HCT: 40 % (ref 36.0–46.0)
Hemoglobin: 13.7 g/dL (ref 12.0–15.0)
MCHC: 34.3 g/dL (ref 30.0–36.0)
MCV: 89.2 fl (ref 78.0–100.0)
Platelets: 283 10*3/uL (ref 150.0–400.0)
RBC: 4.49 Mil/uL (ref 3.87–5.11)
RDW: 13.9 % (ref 11.5–15.5)
WBC: 7.4 10*3/uL (ref 4.0–10.5)

## 2019-01-17 LAB — LDL CHOLESTEROL, DIRECT: Direct LDL: 130 mg/dL

## 2019-01-17 LAB — LIPASE: Lipase: 23 U/L (ref 11.0–59.0)

## 2019-01-17 LAB — AMYLASE: Amylase: 39 U/L (ref 27–131)

## 2019-01-17 LAB — HEMOGLOBIN A1C: Hgb A1c MFr Bld: 7.9 % — ABNORMAL HIGH (ref 4.6–6.5)

## 2019-01-17 LAB — TSH: TSH: 1.34 u[IU]/mL (ref 0.35–4.50)

## 2019-01-17 MED ORDER — VENLAFAXINE HCL ER 75 MG PO CP24: 75.0000 mg | ORAL_CAPSULE | Freq: Three times a day (TID) | ORAL | 1 refills | Status: DC

## 2019-01-17 MED ORDER — AMPHETAMINE-DEXTROAMPHET ER 20 MG PO CP24: 20.0000 mg | ORAL_CAPSULE | ORAL | 0 refills | Status: DC

## 2019-01-17 MED ORDER — AMPHETAMINE-DEXTROAMPHETAMINE 10 MG PO TABS: 10.0000 mg | ORAL_TABLET | Freq: Every day | ORAL | 0 refills | Status: DC

## 2019-01-17 MED ORDER — FAMOTIDINE 40 MG PO TABS: 40.0000 mg | ORAL_TABLET | Freq: Every day | ORAL | 3 refills | Status: DC

## 2019-01-17 NOTE — Patient Instructions (Signed)
Pulse Oximeter at home Preventive Care 30-46 Years Old, Female Preventive care refers to visits with your health care provider and lifestyle choices that can promote health and wellness. This includes:  A yearly physical exam. This may also be called an annual well check.  Regular dental visits and eye exams.  Immunizations.  Screening for certain conditions.  Healthy lifestyle choices, such as eating a healthy diet, getting regular exercise, not using drugs or products that contain nicotine and tobacco, and limiting alcohol use. What can I expect for my preventive care visit? Physical exam Your health care provider will check your:  Height and weight. This may be used to calculate body mass index (BMI), which tells if you are at a healthy weight.  Heart rate and blood pressure.  Skin for abnormal spots. Counseling Your health care provider may ask you questions about your:  Alcohol, tobacco, and drug use.  Emotional well-being.  Home and relationship well-being.  Sexual activity.  Eating habits.  Work and work Statistician.  Method of birth control.  Menstrual cycle.  Pregnancy history. What immunizations do I need?  Influenza (flu) vaccine  This is recommended every year. Tetanus, diphtheria, and pertussis (Tdap) vaccine  You may need a Td booster every 10 years. Varicella (chickenpox) vaccine  You may need this if you have not been vaccinated. Human papillomavirus (HPV) vaccine  If recommended by your health care provider, you may need three doses over 6 months. Measles, mumps, and rubella (MMR) vaccine  You may need at least one dose of MMR. You may also need a second dose. Meningococcal conjugate (MenACWY) vaccine  One dose is recommended if you are age 22-21 years and a first-year college student living in a residence hall, or if you have one of several medical conditions. You may also need additional booster doses. Pneumococcal conjugate (PCV13)  vaccine  You may need this if you have certain conditions and were not previously vaccinated. Pneumococcal polysaccharide (PPSV23) vaccine  You may need one or two doses if you smoke cigarettes or if you have certain conditions. Hepatitis A vaccine  You may need this if you have certain conditions or if you travel or work in places where you may be exposed to hepatitis A. Hepatitis B vaccine  You may need this if you have certain conditions or if you travel or work in places where you may be exposed to hepatitis B. Haemophilus influenzae type b (Hib) vaccine  You may need this if you have certain conditions. You may receive vaccines as individual doses or as more than one vaccine together in one shot (combination vaccines). Talk with your health care provider about the risks and benefits of combination vaccines. What tests do I need?  Blood tests  Lipid and cholesterol levels. These may be checked every 5 years starting at age 81.  Hepatitis C test.  Hepatitis B test. Screening  Diabetes screening. This is done by checking your blood sugar (glucose) after you have not eaten for a while (fasting).  Sexually transmitted disease (STD) testing.  BRCA-related cancer screening. This may be done if you have a family history of breast, ovarian, tubal, or peritoneal cancers.  Pelvic exam and Pap test. This may be done every 3 years starting at age 50. Starting at age 73, this may be done every 5 years if you have a Pap test in combination with an HPV test. Talk with your health care provider about your test results, treatment options, and if necessary, the  need for more tests. Follow these instructions at home: Eating and drinking   Eat a diet that includes fresh fruits and vegetables, whole grains, lean protein, and low-fat dairy.  Take vitamin and mineral supplements as recommended by your health care provider.  Do not drink alcohol if: ? Your health care provider tells you not  to drink. ? You are pregnant, may be pregnant, or are planning to become pregnant.  If you drink alcohol: ? Limit how much you have to 0-1 drink a day. ? Be aware of how much alcohol is in your drink. In the [place] one drink equals one 12 oz bottle of beer (355 mL), one 5 oz glass of wine (148 mL), or one 1 oz glass of hard liquor (44 mL). Lifestyle  Take daily care of your teeth and gums.  Stay active. Exercise for at least 30 minutes on 5 or more days each week.  Do not use any products that contain nicotine or tobacco, such as cigarettes, e-cigarettes, and chewing tobacco. If you need help quitting, ask your health care provider.  If you are sexually active, practice safe sex. Use a condom or other form of birth control (contraception) in order to prevent pregnancy and STIs (sexually transmitted infections). If you plan to become pregnant, see your health care provider for a preconception visit. What's next?  Visit your health care provider once a year for a well check visit.  Ask your health care provider how often you should have your eyes and teeth checked.  Stay up to date on all vaccines. This information is not intended to replace advice given to you by your health care provider. Make sure you discuss any questions you have with your health care provider. Document Released: 08/02/2001 Document Revised: 02/15/2018 Document Reviewed: 02/15/2018 Elsevier Patient Education  2020 [employer].

## 2019-01-19 ENCOUNTER — Encounter: Payer: Self-pay | Admitting: Family Medicine

## 2019-01-20 DIAGNOSIS — E119 Type 2 diabetes mellitus without complications: Secondary | ICD-10-CM | POA: Insufficient documentation

## 2019-01-20 DIAGNOSIS — W57XXXA Bitten or stung by nonvenomous insect and other nonvenomous arthropods, initial encounter: Secondary | ICD-10-CM | POA: Insufficient documentation

## 2019-01-20 NOTE — Assessment & Plan Note (Signed)
Has not used meds over the summer and given some irritability with current meds will try long acting Adderall 20 mg in am and short acting 10 mg in afternoon as needed. Recheck in 1 month.

## 2019-01-20 NOTE — Assessment & Plan Note (Signed)
Avoid offending foods, start probiotics. Do not eat large meals in late evening and consider raising head of bed. Famotidine 40 mg qhs

## 2019-01-20 NOTE — Assessment & Plan Note (Signed)
Patient encouraged to maintain heart healthy diet, regular exercise, adequate sleep. Consider daily probiotics. Take medications as prescribed. Labs ordered and reviewed 

## 2019-01-20 NOTE — Progress Notes (Signed)
Subjective:    Patient ID: Kristina Watts, female    DOB: 07-May-1971, 48 y.o.   MRN: [id number]  No chief complaint on file.   HPI Patient is in today for annual preventative exam and follow-up on chronic medical concerns including asthma, hyperlipidemia, depression and more. No polyuria or polydipsia. No recent febrile illness or hospitalizations. She notes the Vyvanse is making her irritable and she did not take much of this over the summer but she is headed back to school soon. Notes lots of stress but is managing well. She is not exercising routinely or maintaining a heart healthy diet. Is maintaining quarantine well. Denies CP/palp/SOB/HA/congestion/fevers/GI or GU c/o. Taking meds as prescribed  Past Medical History:  Diagnosis Date  . Acute bronchitis 11/19/2012  . ADD (attention deficit disorder) 04/27/2012  . Allergy    seasonal  . Asthma   . Back pain 11/07/2013  . Cervical cancer screening 12/23/2014  . Chicken pox as a child  . Depression   . Depression with anxiety 04/18/2011  . Dermatitis 05/18/2011  . Fatigue 05/30/2011  . GDM (gestational diabetes mellitus) 04/19/2011  . History of gestational diabetes mellitus (GDM) 04/19/2011  . History of pancreatitis 04/19/2011  . Hyperlipidemia 04/27/2012  . LUQ pain 12/12/2011  . Migraine 08/23/2011  . Otitis media of right ear 05/30/2011  . Overweight(278.02) 04/19/2011  . Pancreatitis, recurrent 12/12/2011  . Pharyngitis 08/23/2011  . Pneumonia 1998  . Preventative health care 04/19/2011  . Recurrent cold sores   . 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 Reflux 12/12/2011  . Tinea corporis 05/18/2011    Past Surgical History:  Procedure Laterality Date  . APPENDECTOMY  2007  . CESAREAN SECTION  1-02 and 4-04  . CHOLECYSTECTOMY  2000    Family History  Problem Relation Age of Onset  . Thyroid disease Mother   . Hypertension Father   . Hyperlipidemia Father   . Heart disease Father   . Diabetes Father   .  COPD Father        previous smoker  . Stroke Father   . Depression Father   . Restless legs syndrome Father   . Diabetes Brother   . Cancer Maternal Grandmother        Head and Neck  . Heart disease Paternal Grandmother   . Hyperlipidemia Paternal Grandmother   . Hypertension Paternal Grandmother   . Diabetes Paternal Grandmother   . Depression Paternal Grandmother   . Stroke Paternal Grandmother   . Restless legs syndrome Paternal Grandmother   . Heart disease Paternal Grandfather   . Hyperlipidemia Paternal Grandfather   . Hypertension Paternal Grandfather   . Stroke Paternal Grandfather   . Diabetes Paternal Grandfather     Social History   Socioeconomic History  . Marital status: Married    Spouse name: Not on file  . Number of children: Not on file  . Years of education: Not on file  . Highest education level: Not on file  Occupational History  . Not on file  Social Needs  . Financial resource strain: Not on file  . Food insecurity    Worry: Not on file    Inability: Not on file  . Transportation needs    Medical: Not on file    Non-medical: Not on file  Tobacco Use  . Smoking status: Never Smoker  . Smokeless tobacco: Never Used  Substance and Sexual Activity  . Alcohol use: Yes    Comment: rarely  .  Drug use: No  . Sexual activity: Yes    Partners: Male  Lifestyle  . Physical activity    Days per week: Not on file    Minutes per session: Not on file  . Stress: Not on file  Relationships  . Social Herbalist on phone: Not on file    Gets together: Not on file    Attends religious service: Not on file    Active member of club or organization: Not on file    Attends meetings of clubs or organizations: Not on file    Relationship status: Not on file  . Intimate partner violence    Fear of current or ex partner: Not on file    Emotionally abused: Not on file    Physically abused: Not on file    Forced sexual activity: Not on file  Other  Topics Concern  . Not on file  Social History Narrative  . Not on file    Outpatient Medications Prior to Visit  Medication Sig Dispense Refill  . Acetaminophen (TYLENOL EXTRA STRENGTH PO) Take 2 tablets by mouth as needed.    Marland Kitchen albuterol (ACCUNEB) 0.63 MG/3ML nebulizer solution Take 3 mLs (0.63 mg total) by nebulization every 6 (six) hours as needed for wheezing. (Patient not taking: Reported on 12/24/2018) 75 mL 3  . albuterol (PROVENTIL HFA;VENTOLIN HFA) 108 (90 Base) MCG/ACT inhaler Inhale 2 puffs into the lungs every 4 (four) hours as needed for wheezing or shortness of breath. (Patient not taking: Reported on 12/24/2018) 3 Inhaler 1  . Azelastine-Fluticasone 137-50 MCG/ACT SUSP Place into the nose.    . Butalbital-Acetaminophen (BUTALBITAL-APAP) 50-325 MG TABS Take 1 tablet by mouth 3 (three) times daily as needed. 60 each 0  . DENAVIR 1 % cream APPLY TO AFFECTED AREA TOPICALLY EVERY 2 HOURS (Patient not taking: Reported on 12/24/2018) 5 g 0  . ibuprofen (ADVIL,MOTRIN) 200 MG tablet Take 200 mg by mouth every 6 (six) hours as needed.    Marland Kitchen ketoconazole (NIZORAL) 2 % cream Apply 1 application topically daily. 30 g 0  . levocetirizine (XYZAL) 5 MG tablet Take 5 mg by mouth every evening.    . montelukast (SINGULAIR) 10 MG tablet Take 1 tablet (10 mg total) by mouth at bedtime. 90 tablet 0  . Multiple Vitamin (MULTIVITAMIN) tablet Take 1 tablet by mouth daily.    . promethazine (PHENERGAN) 25 MG tablet Take 1 tablet (25 mg total) by mouth every 8 (eight) hours as needed for nausea or vomiting. 30 tablet 1  . SYMBICORT 160-4.5 MCG/ACT inhaler TAKE 2 PUFFS BY MOUTH TWICE A DAY 10.2 g 11  . valACYclovir (VALTREX) 1000 MG tablet TAKE 1 TABLET BY MOUTH TWICE A DAY 60 tablet 1  . amphetamine-dextroamphetamine (ADDERALL) 10 MG tablet Take 1 tablet (10 mg total) by mouth daily. February 2019 (Patient not taking: Reported on 12/24/2018) 30 tablet 0  . amphetamine-dextroamphetamine (ADDERALL) 10 MG tablet  Take 1 tablet (10 mg total) by mouth daily with breakfast. June 2019 (Patient not taking: Reported on 12/24/2018) 30 tablet 0  . doxycycline (VIBRA-TABS) 100 MG tablet Take 1 tablet (100 mg total) by mouth 2 (two) times daily. 15 tablet 0  . lisdexamfetamine (VYVANSE) 30 MG capsule Take 1 capsule (30 mg total) by mouth daily. September 2019 30 capsule 0  . lisdexamfetamine (VYVANSE) 30 MG capsule Take 1 capsule (30 mg total) by mouth daily. August 2019 30 capsule 0  . venlafaxine XR (EFFEXOR-XR) 75 MG  24 hr capsule TAKE 1 CAPSULE (75 MG TOTAL) BY MOUTH 3 (THREE) TIMES DAILY. 270 capsule 1   No facility-administered medications prior to visit.     Allergies  Allergen Reactions  . Actifed [Triprolidine-Pse] Hives    Review of Systems  Constitutional: Positive for malaise/fatigue. Negative for chills and fever.  HENT: Negative for congestion and hearing loss.   Eyes: Negative for discharge.  Respiratory: Negative for cough, sputum production and shortness of breath.   Cardiovascular: Negative for chest pain, palpitations and leg swelling.  Gastrointestinal: Positive for heartburn. Negative for abdominal pain, blood in stool, constipation, diarrhea, nausea and vomiting.  Genitourinary: Negative for dysuria, frequency, hematuria and urgency.  Musculoskeletal: Negative for back pain, falls and myalgias.  Skin: Positive for rash.  Neurological: Negative for dizziness, sensory change, loss of consciousness, weakness and headaches.  Endo/Heme/Allergies: Negative for environmental allergies. Does not bruise/bleed easily.  Psychiatric/Behavioral: Positive for depression. Negative for suicidal ideas. The patient is nervous/anxious. The patient does not have insomnia.        Objective:    Physical Exam Constitutional:      General: She is not in acute distress.    Appearance: She is not diaphoretic.  HENT:     Head: Normocephalic and atraumatic.     Right Ear: External ear normal.     Left  Ear: External ear normal.     Nose: Nose normal.     Mouth/Throat:     Pharynx: No oropharyngeal exudate.  Eyes:     General: No scleral icterus.       Right eye: No discharge.        Left eye: No discharge.     Conjunctiva/sclera: Conjunctivae normal.     Pupils: Pupils are equal, round, and reactive to light.  Neck:     Musculoskeletal: Normal range of motion and neck supple.     Thyroid: No thyromegaly.  Cardiovascular:     Rate and Rhythm: Normal rate and regular rhythm.     Heart sounds: Normal heart sounds. No murmur.  Pulmonary:     Effort: Pulmonary effort is normal. No respiratory distress.     Breath sounds: Normal breath sounds. No wheezing or rales.  Abdominal:     General: Bowel sounds are normal. There is no distension.     Palpations: Abdomen is soft. There is no mass.     Tenderness: There is no abdominal tenderness.  Musculoskeletal: Normal range of motion.        General: No tenderness.  Lymphadenopathy:     Cervical: No cervical adenopathy.  Skin:    General: Skin is warm and dry.     Findings: No rash.  Neurological:     Mental Status: She is alert and oriented to person, place, and time.     Cranial Nerves: No cranial nerve deficit.     Coordination: Coordination normal.     Deep Tendon Reflexes: Reflexes are normal and symmetric. Reflexes normal.     BP 100/72 (BP Location: Left Arm, Patient Position: Sitting, Cuff Size: Normal)   Pulse 92   Temp 98.2 F (36.8 C) (Oral)   Resp 18   Wt 247 lb 6.4 oz (112.2 kg)   SpO2 97%   BMI 41.17 kg/m  Wt Readings from Last 3 Encounters:  01/17/19 247 lb 6.4 oz (112.2 kg)  07/04/18 230 lb (104.3 kg)  03/15/18 239 lb 12.8 oz (108.8 kg)    Diabetic Foot Exam - Simple   No  data filed     Lab Results  Component Value Date   WBC 7.4 01/17/2019   HGB 13.7 01/17/2019   HCT 40.0 01/17/2019   PLT 283.0 01/17/2019   GLUCOSE 201 (H) 01/17/2019   CHOL 186 01/17/2019   TRIG 203.0 (H) 01/17/2019   HDL 42.10  01/17/2019   LDLDIRECT 130.0 01/17/2019   LDLCALC 109 (H) 03/28/2016   ALT 52 (H) 01/17/2019   AST 38 (H) 01/17/2019   NA 137 01/17/2019   K 4.3 01/17/2019   CL 101 01/17/2019   CREATININE 0.69 01/17/2019   BUN 11 01/17/2019   CO2 27 01/17/2019   TSH 1.34 01/17/2019   HGBA1C 7.9 (H) 01/17/2019    Lab Results  Component Value Date   TSH 1.34 01/17/2019   Lab Results  Component Value Date   WBC 7.4 01/17/2019   HGB 13.7 01/17/2019   HCT 40.0 01/17/2019   MCV 89.2 01/17/2019   PLT 283.0 01/17/2019   Lab Results  Component Value Date   NA 137 01/17/2019   K 4.3 01/17/2019   CO2 27 01/17/2019   GLUCOSE 201 (H) 01/17/2019   BUN 11 01/17/2019   CREATININE 0.69 01/17/2019   BILITOT 0.6 01/17/2019   ALKPHOS 81 01/17/2019   AST 38 (H) 01/17/2019   ALT 52 (H) 01/17/2019   PROT 7.1 01/17/2019   ALBUMIN 4.6 01/17/2019   CALCIUM 9.5 01/17/2019   GFR 91.66 01/17/2019   Lab Results  Component Value Date   CHOL 186 01/17/2019   Lab Results  Component Value Date   HDL 42.10 01/17/2019   Lab Results  Component Value Date   LDLCALC 109 (H) 03/28/2016   Lab Results  Component Value Date   TRIG 203.0 (H) 01/17/2019   Lab Results  Component Value Date   CHOLHDL 4 01/17/2019   Lab Results  Component Value Date   HGBA1C 7.9 (H) 01/17/2019       Assessment & Plan:   Problem List Items Addressed This Visit    Depression with anxiety (Chronic)    Is under a great deal of stress with work, the pandemic, her parents, kids and her husband's poor health but she is managing well most days. No change in medications.       Relevant Medications   venlafaxine XR (EFFEXOR-XR) 75 MG 24 hr capsule   Asthma (Chronic)    Doing well recently. Continue current meds.       Obesity (BMI 30-39.9)    Encouraged DASH diet, decrease po intake and increase exercise as tolerated. Needs 7-8 hours of sleep nightly. Avoid trans fats, eat small, frequent meals every 4-5 hours with lean  proteins, complex carbs and healthy fats. Minimize simple carbs, consider [employer] app or healthy weight and wellness.       Relevant Medications   amphetamine-dextroamphetamine (ADDERALL XR) 20 MG 24 hr capsule   amphetamine-dextroamphetamine (ADDERALL) 10 MG tablet   amphetamine-dextroamphetamine (ADDERALL) 10 MG tablet   History of pancreatitis   Preventative health care    Patient encouraged to maintain heart healthy diet, regular exercise, adequate sleep. Consider daily probiotics. Take medications as prescribed. Labs ordered and reviewed.       Migraine   Relevant Medications   venlafaxine XR (EFFEXOR-XR) 75 MG 24 hr capsule   Other Relevant Orders   TSH (Completed)   GERD (gastroesophageal reflux disease)    Avoid offending foods, start probiotics. Do not eat large meals in late evening and consider raising head of bed.  Famotidine 40 mg qhs      Relevant Medications   famotidine (PEPCID) 40 MG tablet   Other Relevant Orders   CBC (Completed)   Comprehensive metabolic panel (Completed)   TSH (Completed)   Hyperlipidemia - Primary    Encouraged heart healthy diet, increase exercise, avoid trans fats, consider a krill oil cap daily      Relevant Orders   Lipid panel (Completed)   Hemoglobin A1c (Completed)   ADD (attention deficit disorder)    Has not used meds over the summer and given some irritability with current meds will try long acting Adderall 20 mg in am and short acting 10 mg in afternoon as needed. Recheck in 1 month.       Tick bite    Recent tick bite unknown length of attachment showed a target lesion per patient so we ran lyme and ehrlichia testing which was negative.       Diabetes mellitus without complication ([place])    [id number] unacceptable, start Metformin 500 mg daily. Increase exercise as tolerated. Continue current meds       Other Visit Diagnoses    Hyperglycemia       Relevant Orders   Comprehensive metabolic panel (Completed)   TSH (Completed)    Hemoglobin A1c (Completed)   Abdominal pain, unspecified abdominal location       Relevant Orders   Amylase (Completed)   Lipase (Completed)   DG Abd 2 Views   H. pylori antibody, IgG (Completed)   Dyspepsia       Relevant Orders   H. pylori antibody, IgG (Completed)   Target rash       Relevant Orders   B. burgdorfi antibodies (Completed)   Ehrlichia antibody panel   Diarrhea, unspecified type       Relevant Orders   B. burgdorfi antibodies (Completed)   Ehrlichia antibody panel   Breast cancer screening       Relevant Orders   MM 3D SCREEN BREAST BILATERAL      I have discontinued Nasira A. Record "Ann"'s lisdexamfetamine, lisdexamfetamine, and doxycycline. I have also changed her amphetamine-dextroamphetamine and amphetamine-dextroamphetamine. Additionally, I am having her start on amphetamine-dextroamphetamine and famotidine. Lastly, I am having her maintain her multivitamin, Acetaminophen (TYLENOL EXTRA STRENGTH PO), ibuprofen, montelukast, valACYclovir, Denavir, levocetirizine, Azelastine-Fluticasone, Symbicort, albuterol, promethazine, Butalbital-APAP, albuterol, ketoconazole, and venlafaxine XR.  Meds ordered this encounter  Medications  . amphetamine-dextroamphetamine (ADDERALL XR) 20 MG 24 hr capsule    Sig: Take 1 capsule (20 mg total) by mouth every morning. August 2020    Dispense:  30 capsule    Refill:  0  . venlafaxine XR (EFFEXOR-XR) 75 MG 24 hr capsule    Sig: Take 1 capsule (75 mg total) by mouth 3 (three) times daily.    Dispense:  270 capsule    Refill:  1  . amphetamine-dextroamphetamine (ADDERALL) 10 MG tablet    Sig: Take 1 tablet (10 mg total) by mouth daily with breakfast. September 2020    Dispense:  30 tablet    Refill:  0  . amphetamine-dextroamphetamine (ADDERALL) 10 MG tablet    Sig: Take 1 tablet (10 mg total) by mouth daily. August 2020    Dispense:  30 tablet    Refill:  0  . famotidine (PEPCID) 40 MG tablet    Sig: Take 1 tablet (40 mg  total) by mouth daily.    Dispense:  90 tablet    Refill:  3     Erline Levine  Charlett Blake, MD

## 2019-01-20 NOTE — Assessment & Plan Note (Signed)
Recent tick bite unknown length of attachment showed a target lesion per patient so we ran lyme and ehrlichia testing which was negative.

## 2019-01-20 NOTE — Assessment & Plan Note (Signed)
Is under a great deal of stress with work, the pandemic, her parents, kids and her husband's poor health but she is managing well most days. No change in medications.

## 2019-01-20 NOTE — Assessment & Plan Note (Signed)
hgba1c unacceptable, start Metformin 500 mg daily. Increase exercise as tolerated. Continue current meds

## 2019-01-20 NOTE — Assessment & Plan Note (Signed)
Doing well recently. Continue current meds.

## 2019-01-20 NOTE — Assessment & Plan Note (Signed)
Encouraged heart healthy diet, increase exercise, avoid trans fats, consider a krill oil cap daily 

## 2019-01-20 NOTE — Assessment & Plan Note (Signed)
Encouraged DASH diet, decrease po intake and increase exercise as tolerated. Needs 7-8 hours of sleep nightly. Avoid trans fats, eat small, frequent meals every 4-5 hours with lean proteins, complex carbs and healthy fats. Minimize simple carbs, consider [employer] app or healthy weight and wellness.

## 2019-01-21 MED ORDER — METFORMIN HCL 500 MG PO TABS: 500.0000 mg | ORAL_TABLET | Freq: Every day | ORAL | 2 refills | Status: DC

## 2019-01-24 LAB — EHRLICHIA ANTIBODY PANEL
E. CHAFFEENSIS AB IGG: <1:64 {titer}
E. CHAFFEENSIS AB IGM: <1:20 {titer}

## 2019-01-24 LAB — B. BURGDORFI ANTIBODIES: B burgdorferi Ab IgG+IgM: <0.9 index

## 2019-02-14 ENCOUNTER — Encounter: Payer: Self-pay | Admitting: Family Medicine

## 2019-02-14 ENCOUNTER — Ambulatory Visit (INDEPENDENT_AMBULATORY_CARE_PROVIDER_SITE_OTHER): Payer: Managed Care, Other (non HMO) | Admitting: Family Medicine

## 2019-02-14 ENCOUNTER — Other Ambulatory Visit: Payer: Self-pay

## 2019-02-14 DIAGNOSIS — E785 Hyperlipidemia, unspecified: Secondary | ICD-10-CM

## 2019-02-14 DIAGNOSIS — F988 Other specified behavioral and emotional disorders with onset usually occurring in childhood and adolescence: Secondary | ICD-10-CM

## 2019-02-14 DIAGNOSIS — E119 Type 2 diabetes mellitus without complications: Secondary | ICD-10-CM | POA: Diagnosis not present

## 2019-02-14 DIAGNOSIS — G43009 Migraine without aura, not intractable, without status migrainosus: Secondary | ICD-10-CM

## 2019-02-14 MED ORDER — AMPHETAMINE-DEXTROAMPHET ER 20 MG PO CP24: 20.0000 mg | ORAL_CAPSULE | ORAL | 0 refills | Status: DC

## 2019-02-17 NOTE — Assessment & Plan Note (Signed)
She feels her current Adderall XR 20 mg daily and she has not needed the Adderall 10 mg prn so far.

## 2019-02-17 NOTE — Assessment & Plan Note (Signed)
Encouraged heart healthy diet, increase exercise, avoid trans fats, consider a krill oil cap daily 

## 2019-02-17 NOTE — Assessment & Plan Note (Signed)
She has been using the MIND diet and has lost some weight and is feeling some better.

## 2019-02-17 NOTE — Progress Notes (Signed)
Virtual Visit via Video Note  I connected with Kristina Watts on 02/14/19 at  2:40 PM EDT by a video enabled telemedicine application and verified that I am speaking with the correct person using two identifiers.  Location: Patient: work Provider: office   I discussed the limitations of evaluation and management by telemedicine and the availability of in person appointments. The patient expressed understanding and agreed to proceed. Kristina Watts, CMA was able to get patient set up on visit, video   Subjective:    Patient ID: Kristina Watts, female    DOB: 03/17/1971, 48 y.o.   MRN: [id number]  Chief Complaint  Patient presents with  . Medication Refill    HPI Patient is in today for follow up on chronic medical concerns including ADD, diabetes, hyperlipidemia, and more. She has started the MIND diet and lost some weight. No recent febrile illness or hospitalizations. She is feeling some better. She feels the Adderall XR 20 mg is helping. She has not needed the 10 mg tabs. No concerning side effects. Denies CP/palp/SOB/congestion/fevers/GI or GU c/o. Taking meds as prescribed  Past Medical History:  Diagnosis Date  . Acute bronchitis 11/19/2012  . ADD (attention deficit disorder) 04/27/2012  . Allergy    seasonal  . Asthma   . Back pain 11/07/2013  . Cervical cancer screening 12/23/2014  . Chicken pox as a child  . Depression   . Depression with anxiety 04/18/2011  . Dermatitis 05/18/2011  . Fatigue 05/30/2011  . GDM (gestational diabetes mellitus) 04/19/2011  . History of gestational diabetes mellitus (GDM) 04/19/2011  . History of pancreatitis 04/19/2011  . Hyperlipidemia 04/27/2012  . LUQ pain 12/12/2011  . Migraine 08/23/2011  . Otitis media of right ear 05/30/2011  . Overweight(278.02) 04/19/2011  . Pancreatitis, recurrent 12/12/2011  . Pharyngitis 08/23/2011  . Pneumonia 1998  . Preventative health care 04/19/2011  . Recurrent cold sores   . 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 Reflux 12/12/2011  . Tinea corporis 05/18/2011    Past Surgical History:  Procedure Laterality Date  . APPENDECTOMY  2007  . CESAREAN SECTION  1-02 and 4-04  . CHOLECYSTECTOMY  2000    Family History  Problem Relation Age of Onset  . Thyroid disease Mother   . Hypertension Father   . Hyperlipidemia Father   . Heart disease Father   . Diabetes Father   . COPD Father        previous smoker  . Stroke Father   . Depression Father   . Restless legs syndrome Father   . Diabetes Brother   . Cancer Maternal Grandmother        Head and Neck  . Heart disease Paternal Grandmother   . Hyperlipidemia Paternal Grandmother   . Hypertension Paternal Grandmother   . Diabetes Paternal Grandmother   . Depression Paternal Grandmother   . Stroke Paternal Grandmother   . Restless legs syndrome Paternal Grandmother   . Heart disease Paternal Grandfather   . Hyperlipidemia Paternal Grandfather   . Hypertension Paternal Grandfather   . Stroke Paternal Grandfather   . Diabetes Paternal Grandfather     Social History   Socioeconomic History  . Marital status: Married    Spouse name: Not on file  . Number of children: Not on file  . Years of education: Not on file  . Highest education level: Not on file  Occupational History  . Not on file  Social Needs  . Financial resource strain:  Not on file  . Food insecurity    Worry: Not on file    Inability: Not on file  . Transportation needs    Medical: Not on file    Non-medical: Not on file  Tobacco Use  . Smoking status: Never Smoker  . Smokeless tobacco: Never Used  Substance and Sexual Activity  . Alcohol use: Yes    Comment: rarely  . Drug use: No  . Sexual activity: Yes    Partners: Male  Lifestyle  . Physical activity    Days per week: Not on file    Minutes per session: Not on file  . Stress: Not on file  Relationships  . Social Herbalist on phone: Not on file    Gets together:  Not on file    Attends religious service: Not on file    Active member of club or organization: Not on file    Attends meetings of clubs or organizations: Not on file    Relationship status: Not on file  . Intimate partner violence    Fear of current or ex partner: Not on file    Emotionally abused: Not on file    Physically abused: Not on file    Forced sexual activity: Not on file  Other Topics Concern  . Not on file  Social History Narrative  . Not on file    Outpatient Medications Prior to Visit  Medication Sig Dispense Refill  . albuterol (ACCUNEB) 0.63 MG/3ML nebulizer solution Take 3 mLs (0.63 mg total) by nebulization every 6 (six) hours as needed for wheezing. 75 mL 3  . Acetaminophen (TYLENOL EXTRA STRENGTH PO) Take 2 tablets by mouth as needed.    Marland Kitchen amphetamine-dextroamphetamine (ADDERALL) 10 MG tablet Take 1 tablet (10 mg total) by mouth daily with breakfast. September 2020 30 tablet 0  . amphetamine-dextroamphetamine (ADDERALL) 10 MG tablet Take 1 tablet (10 mg total) by mouth daily. August 2020 30 tablet 0  . Azelastine-Fluticasone 137-50 MCG/ACT SUSP Place into the nose.    . Butalbital-Acetaminophen (BUTALBITAL-APAP) 50-325 MG TABS Take 1 tablet by mouth 3 (three) times daily as needed. 60 each 0  . famotidine (PEPCID) 40 MG tablet Take 1 tablet (40 mg total) by mouth daily. 90 tablet 3  . ibuprofen (ADVIL,MOTRIN) 200 MG tablet Take 200 mg by mouth every 6 (six) hours as needed.    Marland Kitchen ketoconazole (NIZORAL) 2 % cream Apply 1 application topically daily. 30 g 0  . levocetirizine (XYZAL) 5 MG tablet Take 5 mg by mouth every evening.    . metFORMIN (GLUCOPHAGE) 500 MG tablet Take 1 tablet (500 mg total) by mouth daily with breakfast. 30 tablet 2  . montelukast (SINGULAIR) 10 MG tablet Take 1 tablet (10 mg total) by mouth at bedtime. 90 tablet 0  . Multiple Vitamin (MULTIVITAMIN) tablet Take 1 tablet by mouth daily.    . promethazine (PHENERGAN) 25 MG tablet Take 1 tablet  (25 mg total) by mouth every 8 (eight) hours as needed for nausea or vomiting. 30 tablet 1  . SYMBICORT 160-4.5 MCG/ACT inhaler TAKE 2 PUFFS BY MOUTH TWICE A DAY 10.2 g 11  . valACYclovir (VALTREX) 1000 MG tablet TAKE 1 TABLET BY MOUTH TWICE A DAY 60 tablet 1  . venlafaxine XR (EFFEXOR-XR) 75 MG 24 hr capsule Take 1 capsule (75 mg total) by mouth 3 (three) times daily. 270 capsule 1  . albuterol (PROVENTIL HFA;VENTOLIN HFA) 108 (90 Base) MCG/ACT inhaler Inhale 2 puffs  into the lungs every 4 (four) hours as needed for wheezing or shortness of breath. (Patient not taking: Reported on 12/24/2018) 3 Inhaler 1  . amphetamine-dextroamphetamine (ADDERALL XR) 20 MG 24 hr capsule Take 1 capsule (20 mg total) by mouth every morning. August 2020 30 capsule 0  . DENAVIR 1 % cream APPLY TO AFFECTED AREA TOPICALLY EVERY 2 HOURS (Patient not taking: Reported on 12/24/2018) 5 g 0   No facility-administered medications prior to visit.     Allergies  Allergen Reactions  . Actifed [Triprolidine-Pse] Hives    Review of Systems  Constitutional: Positive for malaise/fatigue. Negative for fever.  HENT: Negative for congestion.   Eyes: Negative for blurred vision.  Respiratory: Negative for shortness of breath.   Cardiovascular: Negative for chest pain, palpitations and leg swelling.  Gastrointestinal: Negative for abdominal pain, blood in stool and nausea.  Genitourinary: Negative for dysuria and frequency.  Musculoskeletal: Negative for falls.  Skin: Negative for rash.  Neurological: Negative for dizziness, loss of consciousness and headaches.  Endo/Heme/Allergies: Negative for environmental allergies.  Psychiatric/Behavioral: Negative for depression. The patient is not nervous/anxious.        Objective:    Physical Exam Constitutional:      Appearance: Normal appearance. She is not ill-appearing.  HENT:     Head: Normocephalic and atraumatic.     Nose: Nose normal.  Eyes:     General:        Right  eye: No discharge.        Left eye: No discharge.  Pulmonary:     Effort: Pulmonary effort is normal.  Neurological:     Mental Status: She is alert and oriented to person, place, and time.  Psychiatric:        Mood and Affect: Mood normal.        Behavior: Behavior normal.     Wt 239 lb (108.4 kg)   BMI 39.77 kg/m  Wt Readings from Last 3 Encounters:  02/14/19 239 lb (108.4 kg)  01/17/19 247 lb 6.4 oz (112.2 kg)  07/04/18 230 lb (104.3 kg)    Diabetic Foot Exam - Simple   No data filed     Lab Results  Component Value Date   WBC 7.4 01/17/2019   HGB 13.7 01/17/2019   HCT 40.0 01/17/2019   PLT 283.0 01/17/2019   GLUCOSE 201 (H) 01/17/2019   CHOL 186 01/17/2019   TRIG 203.0 (H) 01/17/2019   HDL 42.10 01/17/2019   LDLDIRECT 130.0 01/17/2019   LDLCALC 109 (H) 03/28/2016   ALT 52 (H) 01/17/2019   AST 38 (H) 01/17/2019   NA 137 01/17/2019   K 4.3 01/17/2019   CL 101 01/17/2019   CREATININE 0.69 01/17/2019   BUN 11 01/17/2019   CO2 27 01/17/2019   TSH 1.34 01/17/2019   HGBA1C 7.9 (H) 01/17/2019    Lab Results  Component Value Date   TSH 1.34 01/17/2019   Lab Results  Component Value Date   WBC 7.4 01/17/2019   HGB 13.7 01/17/2019   HCT 40.0 01/17/2019   MCV 89.2 01/17/2019   PLT 283.0 01/17/2019   Lab Results  Component Value Date   NA 137 01/17/2019   K 4.3 01/17/2019   CO2 27 01/17/2019   GLUCOSE 201 (H) 01/17/2019   BUN 11 01/17/2019   CREATININE 0.69 01/17/2019   BILITOT 0.6 01/17/2019   ALKPHOS 81 01/17/2019   AST 38 (H) 01/17/2019   ALT 52 (H) 01/17/2019   PROT 7.1 01/17/2019  ALBUMIN 4.6 01/17/2019   CALCIUM 9.5 01/17/2019   GFR 91.66 01/17/2019   Lab Results  Component Value Date   CHOL 186 01/17/2019   Lab Results  Component Value Date   HDL 42.10 01/17/2019   Lab Results  Component Value Date   LDLCALC 109 (H) 03/28/2016   Lab Results  Component Value Date   TRIG 203.0 (H) 01/17/2019   Lab Results  Component Value  Date   CHOLHDL 4 01/17/2019   Lab Results  Component Value Date   HGBA1C 7.9 (H) 01/17/2019       Assessment & Plan:   Problem List Items Addressed This Visit    Migraine    Encouraged increased hydration, 64 ounces of clear fluids daily. Minimize alcohol and caffeine. Eat small frequent meals with lean proteins and complex carbs. Avoid high and low blood sugars. Get adequate sleep, 7-8 hours a night. Needs exercise daily preferably in the morning.      Hyperlipidemia    Encouraged heart healthy diet, increase exercise, avoid trans fats, consider a krill oil cap daily      ADD (attention deficit disorder)    She feels her current Adderall XR 20 mg daily and she has not needed the Adderall 10 mg prn so far.       Diabetes mellitus without complication ([place])    She has been using the MIND diet and has lost some weight and is feeling some better.          I have discontinued Laikyn A. Spearman "Ann"'s Denavir, albuterol, and albuterol. I am also having her maintain her multivitamin, Acetaminophen (TYLENOL EXTRA STRENGTH PO), ibuprofen, montelukast, valACYclovir, levocetirizine, Azelastine-Fluticasone, Symbicort, promethazine, Butalbital-APAP, ketoconazole, venlafaxine XR, amphetamine-dextroamphetamine, amphetamine-dextroamphetamine, famotidine, metFORMIN, and amphetamine-dextroamphetamine.  Meds ordered this encounter  Medications  . amphetamine-dextroamphetamine (ADDERALL XR) 20 MG 24 hr capsule    Sig: Take 1 capsule (20 mg total) by mouth every morning. August 2020    Dispense:  30 capsule    Refill:  0      I discussed the assessment and treatment plan with the patient. The patient was provided an opportunity to ask questions and all were answered. The patient agreed with the plan and demonstrated an understanding of the instructions.   The patient was advised to call back or seek an in-person evaluation if the symptoms worsen or if the condition fails to improve as  anticipated.  I provided 25 minutes of non-face-to-face time during this encounter.   Penni Homans, MD

## 2019-02-17 NOTE — Assessment & Plan Note (Signed)
Encouraged increased hydration, 64 ounces of clear fluids daily. Minimize alcohol and caffeine. Eat small frequent meals with lean proteins and complex carbs. Avoid high and low blood sugars. Get adequate sleep, 7-8 hours a night. Needs exercise daily preferably in the morning.  

## 2019-04-14 ENCOUNTER — Other Ambulatory Visit: Payer: Self-pay | Admitting: Family Medicine

## 2019-04-18 ENCOUNTER — Other Ambulatory Visit: Payer: Self-pay

## 2019-04-18 MED ORDER — AMPHETAMINE-DEXTROAMPHETAMINE 10 MG PO TABS: 10.0000 mg | ORAL_TABLET | Freq: Every day | ORAL | 0 refills | Status: DC

## 2019-04-18 NOTE — Telephone Encounter (Signed)
Requesting:adderall  Contract:yes UDS:n/a Last OV:02/14/19 Next OV:n/a Last Refill:01/17/19 Database:   Please advise

## 2019-05-11 ENCOUNTER — Other Ambulatory Visit: Payer: Self-pay

## 2019-05-11 DIAGNOSIS — Z20822 Contact with and (suspected) exposure to covid-19: Secondary | ICD-10-CM

## 2019-05-13 LAB — NOVEL CORONAVIRUS, NAA: SARS-CoV-2, NAA: NOT DETECTED

## 2019-07-03 ENCOUNTER — Telehealth: Payer: Self-pay | Admitting: Family Medicine

## 2019-07-03 NOTE — Telephone Encounter (Signed)
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 She said that she needs an in office afternoon appt. Please advise if Dr. Charlett Blake can see on 2/4 3:40pm or another late afternoon to reschedule pt to.

## 2019-07-04 NOTE — Telephone Encounter (Signed)
Left detailed message patient can come in the office on the day of her appt

## 2019-07-23 ENCOUNTER — Other Ambulatory Visit: Payer: Self-pay | Admitting: Family Medicine

## 2019-07-24 ENCOUNTER — Other Ambulatory Visit: Payer: Self-pay

## 2019-07-25 ENCOUNTER — Ambulatory Visit (INDEPENDENT_AMBULATORY_CARE_PROVIDER_SITE_OTHER): Payer: Managed Care, Other (non HMO) | Admitting: Family Medicine

## 2019-07-25 ENCOUNTER — Encounter: Payer: Self-pay | Admitting: Family Medicine

## 2019-07-25 VITALS — BP 108/78 | HR 101 | Temp 98.1°F | Resp 18 | Wt 238.4 lb

## 2019-07-25 DIAGNOSIS — E669 Obesity, unspecified: Secondary | ICD-10-CM

## 2019-07-25 DIAGNOSIS — E119 Type 2 diabetes mellitus without complications: Secondary | ICD-10-CM

## 2019-07-25 DIAGNOSIS — K219 Gastro-esophageal reflux disease without esophagitis: Secondary | ICD-10-CM | POA: Diagnosis not present

## 2019-07-25 DIAGNOSIS — J45909 Unspecified asthma, uncomplicated: Secondary | ICD-10-CM

## 2019-07-25 DIAGNOSIS — E785 Hyperlipidemia, unspecified: Secondary | ICD-10-CM

## 2019-07-25 DIAGNOSIS — F988 Other specified behavioral and emotional disorders with onset usually occurring in childhood and adolescence: Secondary | ICD-10-CM

## 2019-07-25 DIAGNOSIS — B354 Tinea corporis: Secondary | ICD-10-CM

## 2019-07-25 MED ORDER — AMPHETAMINE-DEXTROAMPHETAMINE 10 MG PO TABS: 10.0000 mg | ORAL_TABLET | Freq: Every day | ORAL | 0 refills | Status: DC

## 2019-07-25 MED ORDER — KETOCONAZOLE 2 % EX CREA: 1.0000 "application " | TOPICAL_CREAM | Freq: Every day | CUTANEOUS | 1 refills | Status: AC

## 2019-07-25 NOTE — Patient Instructions (Addendum)
Omron Blood Pressure cuff, upper arm, want BP 100-140/60-90 Pulse oximeter, want oxygen in 90s  Weekly vitals  Take Multivitamin with minerals, selenium Vitamin D 1000-2000 IU daily Probiotic with lactobacillus and bifidophilus Asprin EC 81 mg daily  Melatonin 2-5 mg at bedtime  [email or web address]  The mRNA technology has been in development for 20 years and we already had the Coronavirus family of viruses (which usually just cause the common cold) genetically mapped already which is why we were able to come up with viable vaccine candidates so quickly in stage 1, then stage 2 scientifically took the correct amount of time what we did to speed it up was just build the manufacturing platform at the same time we were running the experiments so if it worked we could produce faster. And stage 3 has now had many months and millions of people immunized and we are seeing the immunity hold for over 9 months now with sign of it dissipating and no significant numbers of adverse reactions.  During every flu season we see 2 anaphylactic reactions for every million shots given and we initially thought we would see 11 per million with the COVID vaccine but now we see only 2-3 with Moderna and 5 or so with [place] so compared to someone is dying every 20 minutes from [place] and more deadly and infectious strains are coming it is definitely best when weighing the risks and benefits to take the shots.  Another pooled analysis of the 5 most utilized vaccines in the world shows that after full immunization so far no one has died from [place].

## 2019-07-26 ENCOUNTER — Other Ambulatory Visit: Payer: Self-pay

## 2019-07-26 LAB — COMPREHENSIVE METABOLIC PANEL
ALT: 41 U/L — ABNORMAL HIGH (ref 0–35)
AST: 22 U/L (ref 0–37)
Albumin: 4.3 g/dL (ref 3.5–5.2)
Alkaline Phosphatase: 84 U/L (ref 39–117)
BUN: 13 mg/dL (ref 6–23)
CO2: 28 mEq/L (ref 19–32)
Calcium: 9.4 mg/dL (ref 8.4–10.5)
Chloride: 100 mEq/L (ref 96–112)
Creatinine, Ser: 0.73 mg/dL (ref 0.40–1.20)
GFR: 85.7 mL/min (ref >60.00)
Glucose, Bld: 279 mg/dL — ABNORMAL HIGH (ref 70–99)
Potassium: 4.7 mEq/L (ref 3.5–5.1)
Sodium: 136 mEq/L (ref 135–145)
Total Bilirubin: 0.5 mg/dL (ref 0.2–1.2)
Total Protein: 6.7 g/dL (ref 6.0–8.3)

## 2019-07-26 LAB — CBC
HCT: 37.9 % (ref 36.0–46.0)
Hemoglobin: 13 g/dL (ref 12.0–15.0)
MCHC: 34.3 g/dL (ref 30.0–36.0)
MCV: 88.8 fl (ref 78.0–100.0)
Platelets: 306 10*3/uL (ref 150.0–400.0)
RBC: 4.27 Mil/uL (ref 3.87–5.11)
RDW: 13.8 % (ref 11.5–15.5)
WBC: 7.9 10*3/uL (ref 4.0–10.5)

## 2019-07-26 LAB — LIPID PANEL
Cholesterol: 179 mg/dL (ref 0–200)
HDL: 36.1 mg/dL — ABNORMAL LOW (ref >39.00)
Total CHOL/HDL Ratio: 5
Triglycerides: 458 mg/dL — ABNORMAL HIGH (ref 0.0–149.0)

## 2019-07-26 LAB — LDL CHOLESTEROL, DIRECT: Direct LDL: 108 mg/dL

## 2019-07-26 LAB — TSH: TSH: 1.45 u[IU]/mL (ref 0.35–4.50)

## 2019-07-26 LAB — HEMOGLOBIN A1C: Hgb A1c MFr Bld: 9 % — ABNORMAL HIGH (ref 4.6–6.5)

## 2019-07-26 MED ORDER — ATORVASTATIN CALCIUM 10 MG PO TABS: 10.0000 mg | ORAL_TABLET | Freq: Every day | ORAL | 1 refills | Status: DC

## 2019-07-26 MED ORDER — METFORMIN HCL 1000 MG PO TABS: 1000.0000 mg | ORAL_TABLET | Freq: Two times a day (BID) | ORAL | 1 refills | Status: DC

## 2019-07-29 DIAGNOSIS — B354 Tinea corporis: Secondary | ICD-10-CM | POA: Insufficient documentation

## 2019-07-29 NOTE — Assessment & Plan Note (Signed)
hgba1c unacceptable, minimize simple carbs. Increase exercise as tolerated. Continue current meds but increase Metfromin to 1000 mg bid.

## 2019-07-29 NOTE — Assessment & Plan Note (Signed)
Encouraged heart healthy diet, increase exercise, avoid trans fats, consider a krill oil cap daily, tolerating Atorvastatin 

## 2019-07-29 NOTE — Assessment & Plan Note (Signed)
Encouraged DASH diet, decrease po intake and increase exercise as tolerated. Needs 7-8 hours of sleep nightly. Avoid trans fats, eat small, frequent meals every 4-5 hours with lean proteins, complex carbs and healthy fats. Minimize simple carbs, consider weight watchers APP

## 2019-07-29 NOTE — Assessment & Plan Note (Signed)
Left upper thigh, given a refill on Keoconazole which has worked for her in the past.

## 2019-07-29 NOTE — Assessment & Plan Note (Signed)
Has been managing with no Adderal and she has agreed to restart only at 10 mg daily

## 2019-07-29 NOTE — Progress Notes (Signed)
Subjective:    Patient ID: Kristina Watts, female    DOB: February 12, 1971, 49 y.o.   MRN: [id number]  No chief complaint on file.   HPI Patient is in today for follow up on chronic medical concerns. No recent febrile illness or hospitalizations. She is still working as a Development worker, community her home life as well. No acute concerns. Is managing her asthma well. Has stoped using Adderall but she thinks she could use a low dose. Denies CP/palp/SOB/HA/congestion/fevers/GI or GU c/o. Taking meds as prescribed  Past Medical History:  Diagnosis Date  . Acute bronchitis 11/19/2012  . ADD (attention deficit disorder) 04/27/2012  . Allergy    seasonal  . Asthma   . Back pain 11/07/2013  . Cervical cancer screening 12/23/2014  . Chicken pox as a child  . Depression   . Depression with anxiety 04/18/2011  . Dermatitis 05/18/2011  . Fatigue 05/30/2011  . GDM (gestational diabetes mellitus) 04/19/2011  . History of gestational diabetes mellitus (GDM) 04/19/2011  . History of pancreatitis 04/19/2011  . Hyperlipidemia 04/27/2012  . LUQ pain 12/12/2011  . Migraine 08/23/2011  . Otitis media of right ear 05/30/2011  . Overweight(278.02) 04/19/2011  . Pancreatitis, recurrent 12/12/2011  . Pharyngitis 08/23/2011  . Pneumonia 1998  . Preventative health care 04/19/2011  . Recurrent cold sores   . 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 Reflux 12/12/2011  . Tinea corporis 05/18/2011    Past Surgical History:  Procedure Laterality Date  . APPENDECTOMY  2007  . CESAREAN SECTION  1-02 and 4-04  . CHOLECYSTECTOMY  2000    Family History  Problem Relation Age of Onset  . Thyroid disease Mother   . Hypertension Father   . Hyperlipidemia Father   . Heart disease Father   . Diabetes Father   . COPD Father        previous smoker  . Stroke Father   . Depression Father   . Restless legs syndrome Father   . Diabetes Brother   . Cancer Maternal Grandmother        Head and Neck  . Heart  disease Paternal Grandmother   . Hyperlipidemia Paternal Grandmother   . Hypertension Paternal Grandmother   . Diabetes Paternal Grandmother   . Depression Paternal Grandmother   . Stroke Paternal Grandmother   . Restless legs syndrome Paternal Grandmother   . Heart disease Paternal Grandfather   . Hyperlipidemia Paternal Grandfather   . Hypertension Paternal Grandfather   . Stroke Paternal Grandfather   . Diabetes Paternal Grandfather     Social History   Socioeconomic History  . Marital status: Married    Spouse name: Not on file  . Number of children: Not on file  . Years of education: Not on file  . Highest education level: Not on file  Occupational History  . Not on file  Tobacco Use  . Smoking status: Never Smoker  . Smokeless tobacco: Never Used  Substance and Sexual Activity  . Alcohol use: Yes    Comment: rarely  . Drug use: No  . Sexual activity: Yes    Partners: Male  Other Topics Concern  . Not on file  Social History Narrative  . Not on file   Social Determinants of Health   Financial Resource Strain:   . Difficulty of Paying Living Expenses: Not on file  Food Insecurity:   . Worried About Charity fundraiser in the Last Year: Not on file  .  Ran Out of Food in the Last Year: Not on file  Transportation Needs:   . Lack of Transportation (Medical): Not on file  . Lack of Transportation (Non-Medical): Not on file  Physical Activity:   . Days of Exercise per Week: Not on file  . Minutes of Exercise per Session: Not on file  Stress:   . Feeling of Stress : Not on file  Social Connections:   . Frequency of Communication with Friends and Family: Not on file  . Frequency of Social Gatherings with Friends and Family: Not on file  . Attends Religious Services: Not on file  . Active Member of Clubs or Organizations: Not on file  . Attends Archivist Meetings: Not on file  . Marital Status: Not on file  Intimate Partner Violence:   . Fear of  Current or Ex-Partner: Not on file  . Emotionally Abused: Not on file  . Physically Abused: Not on file  . Sexually Abused: Not on file    Outpatient Medications Prior to Visit  Medication Sig Dispense Refill  . Acetaminophen (TYLENOL EXTRA STRENGTH PO) Take 2 tablets by mouth as needed.    . Azelastine-Fluticasone 137-50 MCG/ACT SUSP Place into the nose.    . Butalbital-Acetaminophen (BUTALBITAL-APAP) 50-325 MG TABS Take 1 tablet by mouth 3 (three) times daily as needed. 60 each 0  . famotidine (PEPCID) 40 MG tablet Take 1 tablet (40 mg total) by mouth daily. 90 tablet 3  . ibuprofen (ADVIL,MOTRIN) 200 MG tablet Take 200 mg by mouth every 6 (six) hours as needed.    Marland Kitchen levocetirizine (XYZAL) 5 MG tablet Take 5 mg by mouth every evening.    . metFORMIN (GLUCOPHAGE) 500 MG tablet TAKE 1 TABLET BY MOUTH EVERY DAY WITH BREAKFAST 30 tablet 2  . montelukast (SINGULAIR) 10 MG tablet Take 1 tablet (10 mg total) by mouth at bedtime. 90 tablet 0  . Multiple Vitamin (MULTIVITAMIN) tablet Take 1 tablet by mouth daily.    . promethazine (PHENERGAN) 25 MG tablet Take 1 tablet (25 mg total) by mouth every 8 (eight) hours as needed for nausea or vomiting. 30 tablet 1  . SYMBICORT 160-4.5 MCG/ACT inhaler TAKE 2 PUFFS BY MOUTH TWICE A DAY 10.2 g 11  . valACYclovir (VALTREX) 1000 MG tablet TAKE 1 TABLET BY MOUTH TWICE A DAY 60 tablet 1  . venlafaxine XR (EFFEXOR-XR) 75 MG 24 hr capsule Take 1 capsule (75 mg total) by mouth 3 (three) times daily. 270 capsule 1  . amphetamine-dextroamphetamine (ADDERALL) 10 MG tablet Take 1 tablet (10 mg total) by mouth daily with breakfast. December 2020 30 tablet 0  . amphetamine-dextroamphetamine (ADDERALL) 10 MG tablet Take 1 tablet (10 mg total) by mouth daily. November 2020 30 tablet 0  . amphetamine-dextroamphetamine (ADDERALL) 10 MG tablet Take 1 tablet (10 mg total) by mouth daily. October 2020 30 tablet 0  . ketoconazole (NIZORAL) 2 % cream Apply 1 application topically  daily. 30 g 0   No facility-administered medications prior to visit.    Allergies  Allergen Reactions  . Actifed [Triprolidine-Pse] Hives    Review of Systems  Constitutional: Positive for malaise/fatigue. Negative for fever.  HENT: Negative for congestion.   Eyes: Negative for blurred vision.  Respiratory: Negative for shortness of breath.   Cardiovascular: Negative for chest pain, palpitations and leg swelling.  Gastrointestinal: Negative for abdominal pain, blood in stool and nausea.  Genitourinary: Negative for dysuria and frequency.  Musculoskeletal: Negative for falls.  Skin: Negative  for rash.  Neurological: Negative for dizziness, loss of consciousness and headaches.  Endo/Heme/Allergies: Negative for environmental allergies.  Psychiatric/Behavioral: Negative for depression. The patient is nervous/anxious.        Objective:    Physical Exam Vitals and nursing note reviewed.  Constitutional:      General: She is not in acute distress.    Appearance: She is well-developed.  HENT:     Head: Normocephalic and atraumatic.     Nose: Nose normal.  Eyes:     General:        Right eye: No discharge.        Left eye: No discharge.  Cardiovascular:     Rate and Rhythm: Normal rate and regular rhythm.     Heart sounds: No murmur.  Pulmonary:     Effort: Pulmonary effort is normal.     Breath sounds: Normal breath sounds.  Abdominal:     General: Bowel sounds are normal.     Palpations: Abdomen is soft.     Tenderness: There is no abdominal tenderness.  Musculoskeletal:     Cervical back: Normal range of motion and neck supple.  Skin:    General: Skin is warm and dry.  Neurological:     Mental Status: She is alert and oriented to person, place, and time.     BP 108/78 (BP Location: Left Arm, Patient Position: Sitting, Cuff Size: Normal)   Pulse (!) 101   Temp 98.1 F (36.7 C) (Temporal)   Resp 18   Wt 238 lb 6.4 oz (108.1 kg)   SpO2 96%   BMI 39.67 kg/m    Wt Readings from Last 3 Encounters:  07/25/19 238 lb 6.4 oz (108.1 kg)  02/14/19 239 lb (108.4 kg)  01/17/19 247 lb 6.4 oz (112.2 kg)    Diabetic Foot Exam - Simple   No data filed     Lab Results  Component Value Date   WBC 7.9 07/25/2019   HGB 13.0 07/25/2019   HCT 37.9 07/25/2019   PLT 306.0 07/25/2019   GLUCOSE 279 (H) 07/25/2019   CHOL 179 07/25/2019   TRIG (H) 07/25/2019    458.0 Triglyceride is over 400; calculations on Lipids are invalid.   HDL 36.10 (L) 07/25/2019   LDLDIRECT 108.0 07/25/2019   LDLCALC 109 (H) 03/28/2016   ALT 41 (H) 07/25/2019   AST 22 07/25/2019   NA 136 07/25/2019   K 4.7 07/25/2019   CL 100 07/25/2019   CREATININE 0.73 07/25/2019   BUN 13 07/25/2019   CO2 28 07/25/2019   TSH 1.45 07/25/2019   HGBA1C 9.0 (H) 07/25/2019    Lab Results  Component Value Date   TSH 1.45 07/25/2019   Lab Results  Component Value Date   WBC 7.9 07/25/2019   HGB 13.0 07/25/2019   HCT 37.9 07/25/2019   MCV 88.8 07/25/2019   PLT 306.0 07/25/2019   Lab Results  Component Value Date   NA 136 07/25/2019   K 4.7 07/25/2019   CO2 28 07/25/2019   GLUCOSE 279 (H) 07/25/2019   BUN 13 07/25/2019   CREATININE 0.73 07/25/2019   BILITOT 0.5 07/25/2019   ALKPHOS 84 07/25/2019   AST 22 07/25/2019   ALT 41 (H) 07/25/2019   PROT 6.7 07/25/2019   ALBUMIN 4.3 07/25/2019   CALCIUM 9.4 07/25/2019   GFR 85.70 07/25/2019   Lab Results  Component Value Date   CHOL 179 07/25/2019   Lab Results  Component Value Date  HDL 36.10 (L) 07/25/2019   Lab Results  Component Value Date   LDLCALC 109 (H) 03/28/2016   Lab Results  Component Value Date   TRIG (H) 07/25/2019    458.0 Triglyceride is over 400; calculations on Lipids are invalid.   Lab Results  Component Value Date   CHOLHDL 5 07/25/2019   Lab Results  Component Value Date   HGBA1C 9.0 (H) 07/25/2019       Assessment & Plan:   Problem List Items Addressed This Visit    Asthma (Chronic)     Stable on current meds, no changes.       Obesity (BMI 30-39.9)    Encouraged DASH diet, decrease po intake and increase exercise as tolerated. Needs 7-8 hours of sleep nightly. Avoid trans fats, eat small, frequent meals every 4-5 hours with lean proteins, complex carbs and healthy fats. Minimize simple carbs, consider weight watchers APP      Relevant Medications   amphetamine-dextroamphetamine (ADDERALL) 10 MG tablet   amphetamine-dextroamphetamine (ADDERALL) 10 MG tablet   amphetamine-dextroamphetamine (ADDERALL) 10 MG tablet   GERD (gastroesophageal reflux disease)   Relevant Orders   CBC (Completed)   Hyperlipidemia - Primary    Encouraged heart healthy diet, increase exercise, avoid trans fats, consider a krill oil cap daily, tolerating Atorvastatin      Relevant Orders   Lipid panel (Completed)   TSH (Completed)   ADD (attention deficit disorder)    Has been managing with no Adderal and she has agreed to restart only at 10 mg daily      Diabetes mellitus without complication (HCC)    [id number] unacceptable, minimize simple carbs. Increase exercise as tolerated. Continue current meds but increase Metfromin to 1000 mg bid.       Relevant Orders   Hemoglobin A1c (Completed)   Comprehensive metabolic panel (Completed)   TSH (Completed)   Tinea corporis    Left upper thigh, given a refill on Keoconazole which has worked for her in the past.       Relevant Medications   ketoconazole (NIZORAL) 2 % cream      I have changed Tanicka A. Folger "Ann"'s amphetamine-dextroamphetamine, amphetamine-dextroamphetamine, and amphetamine-dextroamphetamine. I am also having her maintain her multivitamin, Acetaminophen (TYLENOL EXTRA STRENGTH PO), ibuprofen, montelukast, valACYclovir, levocetirizine, Azelastine-Fluticasone, Symbicort, promethazine, Butalbital-APAP, venlafaxine XR, famotidine, metFORMIN, and ketoconazole.  Meds ordered this encounter  Medications  .  amphetamine-dextroamphetamine (ADDERALL) 10 MG tablet    Sig: Take 1 tablet (10 mg total) by mouth daily with breakfast. April 2021    Dispense:  30 tablet    Refill:  0  . amphetamine-dextroamphetamine (ADDERALL) 10 MG tablet    Sig: Take 1 tablet (10 mg total) by mouth daily. March 2021    Dispense:  30 tablet    Refill:  0  . amphetamine-dextroamphetamine (ADDERALL) 10 MG tablet    Sig: Take 1 tablet (10 mg total) by mouth daily. February 2021    Dispense:  30 tablet    Refill:  0  . ketoconazole (NIZORAL) 2 % cream    Sig: Apply 1 application topically daily.    Dispense:  30 g    Refill:  1     Penni Homans, MD

## 2019-07-29 NOTE — Assessment & Plan Note (Signed)
Stable on current meds, no changes. 

## 2019-08-03 ENCOUNTER — Other Ambulatory Visit: Payer: Self-pay | Admitting: Family Medicine

## 2019-10-22 ENCOUNTER — Other Ambulatory Visit: Payer: Self-pay | Admitting: Family Medicine

## 2019-10-24 ENCOUNTER — Other Ambulatory Visit: Payer: Managed Care, Other (non HMO)

## 2019-10-25 ENCOUNTER — Other Ambulatory Visit: Payer: Self-pay

## 2019-10-25 ENCOUNTER — Other Ambulatory Visit: Payer: Self-pay | Admitting: Emergency Medicine

## 2019-10-25 ENCOUNTER — Other Ambulatory Visit (INDEPENDENT_AMBULATORY_CARE_PROVIDER_SITE_OTHER): Payer: Managed Care, Other (non HMO)

## 2019-10-25 DIAGNOSIS — E119 Type 2 diabetes mellitus without complications: Secondary | ICD-10-CM

## 2019-10-25 DIAGNOSIS — E669 Obesity, unspecified: Secondary | ICD-10-CM

## 2019-10-25 DIAGNOSIS — E785 Hyperlipidemia, unspecified: Secondary | ICD-10-CM

## 2019-10-25 NOTE — Addendum Note (Signed)
Addended by: Caffie Pinto on: 10/25/2019 09:27 AM   Modules accepted: Orders

## 2019-10-25 NOTE — Addendum Note (Signed)
Addended by: Caffie Pinto on: 10/25/2019 09:26 AM   Modules accepted: Orders

## 2019-10-26 LAB — COMPREHENSIVE METABOLIC PANEL
AG Ratio: 1.9 (calc) (ref 1.0–2.5)
ALT: 32 U/L — ABNORMAL HIGH (ref 6–29)
AST: 23 U/L (ref 10–35)
Albumin: 4.3 g/dL (ref 3.6–5.1)
Alkaline phosphatase (APISO): 76 U/L (ref 31–125)
BUN: 14 mg/dL (ref 7–25)
CO2: 27 mmol/L (ref 20–32)
Calcium: 9.4 mg/dL (ref 8.6–10.2)
Chloride: 103 mmol/L (ref 98–110)
Creat: 0.83 mg/dL (ref 0.50–1.10)
Globulin: 2.3 g/dL (calc) (ref 1.9–3.7)
Glucose, Bld: 227 mg/dL — ABNORMAL HIGH (ref 65–99)
Potassium: 4.4 mmol/L (ref 3.5–5.3)
Sodium: 140 mmol/L (ref 135–146)
Total Bilirubin: 0.4 mg/dL (ref 0.2–1.2)
Total Protein: 6.6 g/dL (ref 6.1–8.1)

## 2019-10-26 LAB — LIPID PANEL
Cholesterol: 131 mg/dL (ref <200)
HDL: 35 mg/dL — ABNORMAL LOW (ref >=50)
LDL Cholesterol (Calc): 62 mg/dL (calc)
Non-HDL Cholesterol (Calc): 96 mg/dL (calc) (ref <130)
Total CHOL/HDL Ratio: 3.7 (calc) (ref <5.0)
Triglycerides: 319 mg/dL — ABNORMAL HIGH (ref <150)

## 2019-10-26 LAB — CBC
HCT: 37 % (ref 35.0–45.0)
Hemoglobin: 12.1 g/dL (ref 11.7–15.5)
MCH: 30 pg (ref 27.0–33.0)
MCHC: 32.7 g/dL (ref 32.0–36.0)
MCV: 91.8 fL (ref 80.0–100.0)
MPV: 11.1 fL (ref 7.5–12.5)
Platelets: 290 10*3/uL (ref 140–400)
RBC: 4.03 10*6/uL (ref 3.80–5.10)
RDW: 12.7 % (ref 11.0–15.0)
WBC: 8 10*3/uL (ref 3.8–10.8)

## 2019-10-26 LAB — HEMOGLOBIN A1C
Hgb A1c MFr Bld: 6.8 % of total Hgb — ABNORMAL HIGH (ref <5.7)
Mean Plasma Glucose: 148 (calc)
eAG (mmol/L): 8.2 (calc)

## 2019-10-28 ENCOUNTER — Encounter: Payer: Self-pay | Admitting: *Deleted

## 2020-01-14 ENCOUNTER — Other Ambulatory Visit: Payer: Self-pay | Admitting: Internal Medicine

## 2020-01-21 ENCOUNTER — Other Ambulatory Visit: Payer: Self-pay | Admitting: Internal Medicine

## 2020-01-23 ENCOUNTER — Telehealth: Payer: Self-pay | Admitting: Family Medicine

## 2020-01-23 DIAGNOSIS — E119 Type 2 diabetes mellitus without complications: Secondary | ICD-10-CM

## 2020-01-23 DIAGNOSIS — F418 Other specified anxiety disorders: Secondary | ICD-10-CM

## 2020-01-23 DIAGNOSIS — E785 Hyperlipidemia, unspecified: Secondary | ICD-10-CM

## 2020-01-23 DIAGNOSIS — K219 Gastro-esophageal reflux disease without esophagitis: Secondary | ICD-10-CM

## 2020-01-23 NOTE — Telephone Encounter (Signed)
Patient's CPE for next Thursday had to be reschd.  I placed her on the afternoon of  26th. She wants to know if she needs fasting labs done. She would like to do them before they return to work/school so she can come early in the moring. Please advise.

## 2020-01-24 NOTE — Telephone Encounter (Signed)
Patient scheduled for lab.  I put in future orders for CMP, A1C, Lipid, TSH, CBC.  Is there anything else you would like to add?  She has a CPE on 8/26.

## 2020-01-25 NOTE — Telephone Encounter (Signed)
Those labs are good

## 2020-01-28 ENCOUNTER — Other Ambulatory Visit: Payer: Self-pay | Admitting: Family Medicine

## 2020-01-30 ENCOUNTER — Encounter: Payer: Managed Care, Other (non HMO) | Admitting: Family Medicine

## 2020-01-31 ENCOUNTER — Other Ambulatory Visit (INDEPENDENT_AMBULATORY_CARE_PROVIDER_SITE_OTHER): Payer: Managed Care, Other (non HMO)

## 2020-01-31 ENCOUNTER — Other Ambulatory Visit: Payer: Self-pay

## 2020-01-31 DIAGNOSIS — E785 Hyperlipidemia, unspecified: Secondary | ICD-10-CM | POA: Diagnosis not present

## 2020-01-31 DIAGNOSIS — K219 Gastro-esophageal reflux disease without esophagitis: Secondary | ICD-10-CM

## 2020-01-31 DIAGNOSIS — E119 Type 2 diabetes mellitus without complications: Secondary | ICD-10-CM

## 2020-01-31 DIAGNOSIS — F418 Other specified anxiety disorders: Secondary | ICD-10-CM

## 2020-01-31 LAB — LIPID PANEL
Cholesterol: 139 mg/dL (ref 0–200)
HDL: 36.7 mg/dL — ABNORMAL LOW (ref >39.00)
LDL Cholesterol: 64 mg/dL (ref 0–99)
NonHDL: 101.93
Total CHOL/HDL Ratio: 4
Triglycerides: 192 mg/dL — ABNORMAL HIGH (ref 0.0–149.0)
VLDL: 38.4 mg/dL (ref 0.0–40.0)

## 2020-01-31 LAB — TSH: TSH: 1.36 u[IU]/mL (ref 0.35–4.50)

## 2020-01-31 LAB — COMPREHENSIVE METABOLIC PANEL
ALT: 37 U/L — ABNORMAL HIGH (ref 0–35)
AST: 25 U/L (ref 0–37)
Albumin: 4.4 g/dL (ref 3.5–5.2)
Alkaline Phosphatase: 76 U/L (ref 39–117)
BUN: 11 mg/dL (ref 6–23)
CO2: 27 mEq/L (ref 19–32)
Calcium: 9.4 mg/dL (ref 8.4–10.5)
Chloride: 102 mEq/L (ref 96–112)
Creatinine, Ser: 0.7 mg/dL (ref 0.40–1.20)
GFR: 89.75 mL/min (ref >60.00)
Glucose, Bld: 169 mg/dL — ABNORMAL HIGH (ref 70–99)
Potassium: 4.7 mEq/L (ref 3.5–5.1)
Sodium: 139 mEq/L (ref 135–145)
Total Bilirubin: 0.6 mg/dL (ref 0.2–1.2)
Total Protein: 6.9 g/dL (ref 6.0–8.3)

## 2020-01-31 LAB — CBC
HCT: 37.4 % (ref 36.0–46.0)
Hemoglobin: 12.9 g/dL (ref 12.0–15.0)
MCHC: 34.6 g/dL (ref 30.0–36.0)
MCV: 86.7 fl (ref 78.0–100.0)
Platelets: 283 10*3/uL (ref 150.0–400.0)
RBC: 4.31 Mil/uL (ref 3.87–5.11)
RDW: 14.1 % (ref 11.5–15.5)
WBC: 6.7 10*3/uL (ref 4.0–10.5)

## 2020-01-31 LAB — HEMOGLOBIN A1C: Hgb A1c MFr Bld: 7.5 % — ABNORMAL HIGH (ref 4.6–6.5)

## 2020-02-13 ENCOUNTER — Encounter: Payer: Self-pay | Admitting: Family Medicine

## 2020-02-13 ENCOUNTER — Other Ambulatory Visit: Payer: Self-pay

## 2020-02-13 ENCOUNTER — Ambulatory Visit (INDEPENDENT_AMBULATORY_CARE_PROVIDER_SITE_OTHER): Payer: Managed Care, Other (non HMO) | Admitting: Family Medicine

## 2020-02-13 VITALS — BP 130/78 | HR 100 | Temp 98.0°F | Resp 16 | Ht 65.0 in | Wt 228.8 lb

## 2020-02-13 DIAGNOSIS — Z0000 Encounter for general adult medical examination without abnormal findings: Secondary | ICD-10-CM | POA: Diagnosis not present

## 2020-02-13 DIAGNOSIS — E785 Hyperlipidemia, unspecified: Secondary | ICD-10-CM

## 2020-02-13 DIAGNOSIS — F988 Other specified behavioral and emotional disorders with onset usually occurring in childhood and adolescence: Secondary | ICD-10-CM | POA: Diagnosis not present

## 2020-02-13 DIAGNOSIS — F418 Other specified anxiety disorders: Secondary | ICD-10-CM | POA: Diagnosis not present

## 2020-02-13 DIAGNOSIS — J45909 Unspecified asthma, uncomplicated: Secondary | ICD-10-CM

## 2020-02-13 DIAGNOSIS — Z124 Encounter for screening for malignant neoplasm of cervix: Secondary | ICD-10-CM | POA: Diagnosis not present

## 2020-02-13 DIAGNOSIS — Z1231 Encounter for screening mammogram for malignant neoplasm of breast: Secondary | ICD-10-CM

## 2020-02-13 DIAGNOSIS — E119 Type 2 diabetes mellitus without complications: Secondary | ICD-10-CM

## 2020-02-13 MED ORDER — ONETOUCH VERIO VI STRP: ORAL_STRIP | 1 refills | Status: DC

## 2020-02-13 MED ORDER — ONETOUCH VERIO FLEX SYSTEM W/DEVICE KIT: PACK | 0 refills | Status: DC

## 2020-02-13 MED ORDER — ONETOUCH DELICA PLUS LANCET33G MISC: 1 refills | Status: AC

## 2020-02-13 MED ORDER — BUPROPION HCL ER (XL) 150 MG PO TB24: 150.0000 mg | ORAL_TABLET | Freq: Every day | ORAL | 2 refills | Status: DC

## 2020-02-13 MED ORDER — VENLAFAXINE HCL ER 75 MG PO CP24: 75.0000 mg | ORAL_CAPSULE | Freq: Two times a day (BID) | ORAL | 1 refills | Status: DC

## 2020-02-13 NOTE — Patient Instructions (Signed)
Physiatry is physical medicine and rehab doctor Preventive Care 46-47 Years Old, Female Preventive care refers to visits with your health care provider and lifestyle choices that can promote health and wellness. This includes:  A yearly physical exam. This may also be called an annual well check.  Regular dental visits and eye exams.  Immunizations.  Screening for certain conditions.  Healthy lifestyle choices, such as eating a healthy diet, getting regular exercise, not using drugs or products that contain nicotine and tobacco, and limiting alcohol use. What can I expect for my preventive care visit? Physical exam Your health care provider will check your:  Height and weight. This may be used to calculate body mass index (BMI), which tells if you are at a healthy weight.  Heart rate and blood pressure.  Skin for abnormal spots. Counseling Your health care provider may ask you questions about your:  Alcohol, tobacco, and drug use.  Emotional well-being.  Home and relationship well-being.  Sexual activity.  Eating habits.  Work and work Statistician.  Method of birth control.  Menstrual cycle.  Pregnancy history. What immunizations do I need?  Influenza (flu) vaccine  This is recommended every year. Tetanus, diphtheria, and pertussis (Tdap) vaccine  You may need a Td booster every 10 years. Varicella (chickenpox) vaccine  You may need this if you have not been vaccinated. Zoster (shingles) vaccine  You may need this after age 84. Measles, mumps, and rubella (MMR) vaccine  You may need at least one dose of MMR if you were born in 1957 or later. You may also need a second dose. Pneumococcal conjugate (PCV13) vaccine  You may need this if you have certain conditions and were not previously vaccinated. Pneumococcal polysaccharide (PPSV23) vaccine  You may need one or two doses if you smoke cigarettes or if you have certain conditions. Meningococcal conjugate  (MenACWY) vaccine  You may need this if you have certain conditions. Hepatitis A vaccine  You may need this if you have certain conditions or if you travel or work in places where you may be exposed to hepatitis A. Hepatitis B vaccine  You may need this if you have certain conditions or if you travel or work in places where you may be exposed to hepatitis B. Haemophilus influenzae type b (Hib) vaccine  You may need this if you have certain conditions. Human papillomavirus (HPV) vaccine  If recommended by your health care provider, you may need three doses over 6 months. You may receive vaccines as individual doses or as more than one vaccine together in one shot (combination vaccines). Talk with your health care provider about the risks and benefits of combination vaccines. What tests do I need? Blood tests  Lipid and cholesterol levels. These may be checked every 5 years, or more frequently if you are over 58 years old.  Hepatitis C test.  Hepatitis B test. Screening  Lung cancer screening. You may have this screening every year starting at age 50 if you have a 30-pack-year history of smoking and currently smoke or have quit within the past 15 years.  Colorectal cancer screening. All adults should have this screening starting at age 15 and continuing until age 73. Your health care provider may recommend screening at age 42 if you are at increased risk. You will have tests every 1-10 years, depending on your results and the type of screening test.  Diabetes screening. This is done by checking your blood sugar (glucose) after you have not eaten for  a while (fasting). You may have this done every 1-3 years.  Mammogram. This may be done every 1-2 years. Talk with your health care provider about when you should start having regular mammograms. This may depend on whether you have a family history of breast cancer.  BRCA-related cancer screening. This may be done if you have a family  history of breast, ovarian, tubal, or peritoneal cancers.  Pelvic exam and Pap test. This may be done every 3 years starting at age 12. Starting at age 53, this may be done every 5 years if you have a Pap test in combination with an HPV test. Other tests  Sexually transmitted disease (STD) testing.  Bone density scan. This is done to screen for osteoporosis. You may have this scan if you are at high risk for osteoporosis. Follow these instructions at home: Eating and drinking  Eat a diet that includes fresh fruits and vegetables, whole grains, lean protein, and low-fat dairy.  Take vitamin and mineral supplements as recommended by your health care provider.  Do not drink alcohol if: ? Your health care provider tells you not to drink. ? You are pregnant, may be pregnant, or are planning to become pregnant.  If you drink alcohol: ? Limit how much you have to 0-1 drink a day. ? Be aware of how much alcohol is in your drink. In the [place] one drink equals one 12 oz bottle of beer (355 mL), one 5 oz glass of wine (148 mL), or one 1 oz glass of hard liquor (44 mL). Lifestyle  Take daily care of your teeth and gums.  Stay active. Exercise for at least 30 minutes on 5 or more days each week.  Do not use any products that contain nicotine or tobacco, such as cigarettes, e-cigarettes, and chewing tobacco. If you need help quitting, ask your health care provider.  If you are sexually active, practice safe sex. Use a condom or other form of birth control (contraception) in order to prevent pregnancy and STIs (sexually transmitted infections).  If told by your health care provider, take low-dose aspirin daily starting at age 69. What's next?  Visit your health care provider once a year for a well check visit.  Ask your health care provider how often you should have your eyes and teeth checked.  Stay up to date on all vaccines. This information is not intended to replace advice given to you  by your health care provider. Make sure you discuss any questions you have with your health care provider. Document Revised: 02/15/2018 Document Reviewed: 02/15/2018 Elsevier Patient Education  2020 [employer].

## 2020-02-17 ENCOUNTER — Encounter: Payer: Self-pay | Admitting: Family Medicine

## 2020-02-17 NOTE — Telephone Encounter (Signed)
I gave patient information about covid testing.

## 2020-02-18 NOTE — Assessment & Plan Note (Signed)
Doing well on Symbicort. Continue Albuterol prn

## 2020-02-18 NOTE — Assessment & Plan Note (Signed)
Doing well on current meds, no changes

## 2020-02-18 NOTE — Assessment & Plan Note (Addendum)
Encouraged heart healthy diet, increase exercise, avoid trans fats, consider a krill oil cap daily. Tolerating Atorvastatin 

## 2020-02-18 NOTE — Assessment & Plan Note (Signed)
Referred to OB/GYN for well woman care.

## 2020-02-18 NOTE — Assessment & Plan Note (Signed)
Patient encouraged to maintain heart healthy diet, regular exercise, adequate sleep. Consider daily probiotics. Take medications as prescribed. Labs ordered and reviewed 

## 2020-02-18 NOTE — Assessment & Plan Note (Signed)
Under a great deal of stress with school, her kids and her husband's illness. Continue Venlafaxine and add Wellbutrin. Reassess in 4-8 weeks

## 2020-02-18 NOTE — Progress Notes (Signed)
Subjective:    Patient ID: Kristina Watts, female    DOB: 21-Jun-1970, 49 y.o.   MRN: [id number]  Chief Complaint  Patient presents with  . Annual Exam    HPI Patient is in today for annual preventative exam and follow up on chronic medical concerns. She is back at school and working very hard. She has a great deal of stress with school, her kids and her husband's illness. She feels her antidepressants are not working well enough any more. Notes anhedonia but denies suicidal ideation. No recent flare in asthma or other acute concerns. Denies CP/palp/SOB/HA/congestion/fevers/GI or GU c/o. Taking meds as prescribed  Past Medical History:  Diagnosis Date  . Acute bronchitis 11/19/2012  . ADD (attention deficit disorder) 04/27/2012  . Allergy    seasonal  . Asthma   . Back pain 11/07/2013  . Cervical cancer screening 12/23/2014  . Chicken pox as a child  . Depression   . Depression with anxiety 04/18/2011  . Dermatitis 05/18/2011  . Fatigue 05/30/2011  . GDM (gestational diabetes mellitus) 04/19/2011  . History of gestational diabetes mellitus (GDM) 04/19/2011  . History of pancreatitis 04/19/2011  . Hyperlipidemia 04/27/2012  . LUQ pain 12/12/2011  . Migraine 08/23/2011  . Otitis media of right ear 05/30/2011  . Overweight(278.02) 04/19/2011  . Pancreatitis, recurrent 12/12/2011  . Pharyngitis 08/23/2011  . Pneumonia 1998  . Preventative health care 04/19/2011  . Recurrent cold sores   . 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 Reflux 12/12/2011  . Tinea corporis 05/18/2011    Past Surgical History:  Procedure Laterality Date  . APPENDECTOMY  2007  . CESAREAN SECTION  1-02 and 4-04  . CHOLECYSTECTOMY  2000    Family History  Problem Relation Age of Onset  . Thyroid disease Mother   . Hypertension Father   . Hyperlipidemia Father   . Heart disease Father   . Diabetes Father   . COPD Father        previous smoker  . Stroke Father   . Depression Father   .  Restless legs syndrome Father   . Diabetes Brother   . Cancer Maternal Grandmother        Head and Neck  . Heart disease Paternal Grandmother   . Hyperlipidemia Paternal Grandmother   . Hypertension Paternal Grandmother   . Diabetes Paternal Grandmother   . Depression Paternal Grandmother   . Stroke Paternal Grandmother   . Restless legs syndrome Paternal Grandmother   . Heart disease Paternal Grandfather   . Hyperlipidemia Paternal Grandfather   . Hypertension Paternal Grandfather   . Stroke Paternal Grandfather   . Diabetes Paternal Grandfather     Social History   Socioeconomic History  . Marital status: Married    Spouse name: Not on file  . Number of children: Not on file  . Years of education: Not on file  . Highest education level: Not on file  Occupational History  . Not on file  Tobacco Use  . Smoking status: Never Smoker  . Smokeless tobacco: Never Used  Substance and Sexual Activity  . Alcohol use: Yes    Comment: rarely  . Drug use: No  . Sexual activity: Yes    Partners: Male  Other Topics Concern  . Not on file  Social History Narrative  . Not on file   Social Determinants of Health   Financial Resource Strain:   . Difficulty of Paying Living Expenses: Not on file  Food  Insecurity:   . Worried About Charity fundraiser in the Last Year: Not on file  . Ran Out of Food in the Last Year: Not on file  Transportation Needs:   . Lack of Transportation (Medical): Not on file  . Lack of Transportation (Non-Medical): Not on file  Physical Activity:   . Days of Exercise per Week: Not on file  . Minutes of Exercise per Session: Not on file  Stress:   . Feeling of Stress : Not on file  Social Connections:   . Frequency of Communication with Friends and Family: Not on file  . Frequency of Social Gatherings with Friends and Family: Not on file  . Attends Religious Services: Not on file  . Active Member of Clubs or Organizations: Not on file  . Attends  Archivist Meetings: Not on file  . Marital Status: Not on file  Intimate Partner Violence:   . Fear of Current or Ex-Partner: Not on file  . Emotionally Abused: Not on file  . Physically Abused: Not on file  . Sexually Abused: Not on file    Outpatient Medications Prior to Visit  Medication Sig Dispense Refill  . Acetaminophen (TYLENOL EXTRA STRENGTH PO) Take 2 tablets by mouth as needed.    Marland Kitchen amphetamine-dextroamphetamine (ADDERALL) 10 MG tablet Take 1 tablet (10 mg total) by mouth daily with breakfast. April 2021 30 tablet 0  . amphetamine-dextroamphetamine (ADDERALL) 10 MG tablet Take 1 tablet (10 mg total) by mouth daily. March 2021 30 tablet 0  . amphetamine-dextroamphetamine (ADDERALL) 10 MG tablet Take 1 tablet (10 mg total) by mouth daily. February 2021 30 tablet 0  . atorvastatin (LIPITOR) 10 MG tablet TAKE 1 TABLET BY MOUTH EVERY DAY 90 tablet 1  . Azelastine-Fluticasone 137-50 MCG/ACT SUSP Place into the nose.    . Butalbital-Acetaminophen (BUTALBITAL-APAP) 50-325 MG TABS Take 1 tablet by mouth 3 (three) times daily as needed. 60 each 0  . famotidine (PEPCID) 40 MG tablet TAKE 1 TABLET BY MOUTH EVERY DAY 90 tablet 3  . ibuprofen (ADVIL,MOTRIN) 200 MG tablet Take 200 mg by mouth every 6 (six) hours as needed.    Marland Kitchen ketoconazole (NIZORAL) 2 % cream Apply 1 application topically daily. 30 g 1  . levocetirizine (XYZAL) 5 MG tablet Take 5 mg by mouth every evening.    . metFORMIN (GLUCOPHAGE) 1000 MG tablet Take 1 tablet (1,000 mg total) by mouth 2 (two) times daily with a meal. 180 tablet 1  . metFORMIN (GLUCOPHAGE) 500 MG tablet TAKE 1 TABLET BY MOUTH EVERY DAY WITH BREAKFAST 90 tablet 1  . montelukast (SINGULAIR) 10 MG tablet Take 1 tablet (10 mg total) by mouth at bedtime. 90 tablet 0  . Multiple Vitamin (MULTIVITAMIN) tablet Take 1 tablet by mouth daily.    . promethazine (PHENERGAN) 25 MG tablet Take 1 tablet (25 mg total) by mouth every 8 (eight) hours as needed for  nausea or vomiting. 30 tablet 1  . SYMBICORT 160-4.5 MCG/ACT inhaler TAKE 2 PUFFS BY MOUTH TWICE A DAY 10.2 g 11  . valACYclovir (VALTREX) 1000 MG tablet TAKE 1 TABLET BY MOUTH TWICE A DAY 60 tablet 1  . venlafaxine XR (EFFEXOR-XR) 75 MG 24 hr capsule TAKE 1 CAPSULE (75 MG TOTAL) BY MOUTH 3 (THREE) TIMES DAILY. 270 capsule 1   No facility-administered medications prior to visit.    Allergies  Allergen Reactions  . Actifed [Triprolidine-Pse] Hives    Review of Systems  Constitutional: Positive for malaise/fatigue.  Negative for chills and fever.  HENT: Negative for congestion and hearing loss.   Eyes: Negative for discharge.  Respiratory: Negative for cough, sputum production and shortness of breath.   Cardiovascular: Negative for chest pain, palpitations and leg swelling.  Gastrointestinal: Negative for abdominal pain, blood in stool, constipation, diarrhea, heartburn, nausea and vomiting.  Genitourinary: Negative for dysuria, frequency, hematuria and urgency.  Musculoskeletal: Negative for back pain, falls and myalgias.  Skin: Negative for rash.  Neurological: Negative for dizziness, sensory change, loss of consciousness, weakness and headaches.  Endo/Heme/Allergies: Negative for environmental allergies. Does not bruise/bleed easily.  Psychiatric/Behavioral: Positive for depression. Negative for suicidal ideas. The patient is nervous/anxious. The patient does not have insomnia.        Objective:    Physical Exam Constitutional:      General: She is not in acute distress.    Appearance: She is well-developed.  HENT:     Head: Normocephalic and atraumatic.  Eyes:     Conjunctiva/sclera: Conjunctivae normal.  Neck:     Thyroid: No thyromegaly.  Cardiovascular:     Rate and Rhythm: Normal rate and regular rhythm.     Heart sounds: Normal heart sounds. No murmur heard.   Pulmonary:     Effort: Pulmonary effort is normal. No respiratory distress.     Breath sounds: Normal  breath sounds.  Abdominal:     General: Bowel sounds are normal. There is no distension.     Palpations: Abdomen is soft. There is no mass.     Tenderness: There is no abdominal tenderness.  Musculoskeletal:     Cervical back: Neck supple.  Lymphadenopathy:     Cervical: No cervical adenopathy.  Skin:    General: Skin is warm and dry.  Neurological:     Mental Status: She is alert and oriented to person, place, and time.  Psychiatric:        Behavior: Behavior normal.     BP 130/78 (BP Location: Right Arm, Patient Position: Sitting, Cuff Size: Large)   Pulse 100   Temp 98 F (36.7 C) (Oral)   Resp 16   Ht '5\' 5"'  (1.651 m)   Wt 228 lb 12.8 oz (103.8 kg)   LMP 02/10/2020 (Exact Date)   SpO2 98%   BMI 38.07 kg/m  Wt Readings from Last 3 Encounters:  02/13/20 228 lb 12.8 oz (103.8 kg)  07/25/19 238 lb 6.4 oz (108.1 kg)  02/14/19 239 lb (108.4 kg)    Diabetic Foot Exam - Simple   No data filed     Lab Results  Component Value Date   WBC 6.7 01/31/2020   HGB 12.9 01/31/2020   HCT 37.4 01/31/2020   PLT 283.0 01/31/2020   GLUCOSE 169 (H) 01/31/2020   CHOL 139 01/31/2020   TRIG 192.0 (H) 01/31/2020   HDL 36.70 (L) 01/31/2020   LDLDIRECT 108.0 07/25/2019   LDLCALC 64 01/31/2020   ALT 37 (H) 01/31/2020   AST 25 01/31/2020   NA 139 01/31/2020   K 4.7 01/31/2020   CL 102 01/31/2020   CREATININE 0.70 01/31/2020   BUN 11 01/31/2020   CO2 27 01/31/2020   TSH 1.36 01/31/2020   HGBA1C 7.5 (H) 01/31/2020    Lab Results  Component Value Date   TSH 1.36 01/31/2020   Lab Results  Component Value Date   WBC 6.7 01/31/2020   HGB 12.9 01/31/2020   HCT 37.4 01/31/2020   MCV 86.7 01/31/2020   PLT 283.0 01/31/2020  Lab Results  Component Value Date   NA 139 01/31/2020   K 4.7 01/31/2020   CO2 27 01/31/2020   GLUCOSE 169 (H) 01/31/2020   BUN 11 01/31/2020   CREATININE 0.70 01/31/2020   BILITOT 0.6 01/31/2020   ALKPHOS 76 01/31/2020   AST 25 01/31/2020   ALT 37  (H) 01/31/2020   PROT 6.9 01/31/2020   ALBUMIN 4.4 01/31/2020   CALCIUM 9.4 01/31/2020   GFR 89.75 01/31/2020   Lab Results  Component Value Date   CHOL 139 01/31/2020   Lab Results  Component Value Date   HDL 36.70 (L) 01/31/2020   Lab Results  Component Value Date   LDLCALC 64 01/31/2020   Lab Results  Component Value Date   TRIG 192.0 (H) 01/31/2020   Lab Results  Component Value Date   CHOLHDL 4 01/31/2020   Lab Results  Component Value Date   HGBA1C 7.5 (H) 01/31/2020       Assessment & Plan:   Problem List Items Addressed This Visit    Depression with anxiety (Chronic)    Under a great deal of stress with school, her kids and her husband's illness. Continue Venlafaxine and add Wellbutrin. Reassess in 4-8 weeks      Relevant Medications   buPROPion (WELLBUTRIN XL) 150 MG 24 hr tablet   venlafaxine XR (EFFEXOR-XR) 75 MG 24 hr capsule   Asthma (Chronic)    Doing well on Symbicort. Continue Albuterol prn      Preventative health care    Patient encouraged to maintain heart healthy diet, regular exercise, adequate sleep. Consider daily probiotics. Take medications as prescribed. Labs ordered and reviewed.       Hyperlipidemia    Encouraged heart healthy diet, increase exercise, avoid trans fats, consider a krill oil cap daily. Tolerating Atorvastatin      ADD (attention deficit disorder)    Doing well on current meds, no changes      Cervical cancer screening    Referred to OB/GYN for well woman care.       Relevant Orders   Ambulatory referral to Obstetrics / Gynecology   Diabetes mellitus without complication ([place])    AUQJ3H acceptable, minimize simple carbs. Increase exercise as tolerated. Continue current meds       Other Visit Diagnoses    Encounter for screening mammogram for malignant neoplasm of breast    -  Primary   Relevant Orders   MM 3D SCREEN BREAST BILATERAL      I have changed Zuzu A. Garro "Ann"'s venlafaxine XR. I am  also having her start on OneTouch Delica Plus [id number], OneTouch Verio [employer], [employer], and buPROPion. Additionally, I am having her maintain her multivitamin, Acetaminophen (TYLENOL EXTRA STRENGTH PO), ibuprofen, montelukast, valACYclovir, levocetirizine, Azelastine-Fluticasone, Symbicort, promethazine, Butalbital-APAP, amphetamine-dextroamphetamine, amphetamine-dextroamphetamine, amphetamine-dextroamphetamine, ketoconazole, metFORMIN, metFORMIN, atorvastatin, and famotidine.  Meds ordered this encounter  Medications  . Lancets (ONETOUCH DELICA PLUS [id number]) MISC    Sig: Use to check sugar once a day and as needed.  Dx code: E11.9    Dispense:  100 each    Refill:  1  . Blood Glucose Monitoring Suppl (ONETOUCH VERIO FLEX SYSTEM) w/Device KIT    Sig: Use to check sugar once a day and as needed.  Dx code: E11.9    Dispense:  1 kit    Refill:  0  . glucose blood (ONETOUCH VERIO) test strip    Sig: Use to check sugar once a day and as needed.  Dx code: E11.9    Dispense:  100 each    Refill:  1  . buPROPion (WELLBUTRIN XL) 150 MG 24 hr tablet    Sig: Take 1 tablet (150 mg total) by mouth daily.    Dispense:  30 tablet    Refill:  2  . venlafaxine XR (EFFEXOR-XR) 75 MG 24 hr capsule    Sig: Take 1 capsule (75 mg total) by mouth 2 (two) times daily with breakfast and lunch.    Dispense:  180 capsule    Refill:  1     Penni Homans, MD

## 2020-02-18 NOTE — Assessment & Plan Note (Signed)
hgba1c acceptable, minimize simple carbs. Increase exercise as tolerated. Continue current meds 

## 2020-02-20 ENCOUNTER — Other Ambulatory Visit (HOSPITAL_COMMUNITY): Payer: Self-pay | Admitting: Nurse Practitioner

## 2020-02-20 ENCOUNTER — Telehealth: Payer: Self-pay | Admitting: Family

## 2020-02-20 ENCOUNTER — Encounter: Payer: Self-pay | Admitting: Nurse Practitioner

## 2020-02-20 ENCOUNTER — Telehealth: Payer: Self-pay | Admitting: *Deleted

## 2020-02-20 DIAGNOSIS — U071 COVID-19: Secondary | ICD-10-CM

## 2020-02-20 NOTE — Telephone Encounter (Signed)
Patient information provided to infusion center hotline.

## 2020-02-20 NOTE — Telephone Encounter (Signed)
Error

## 2020-02-20 NOTE — Progress Notes (Signed)
I connected by phone with Kristina Watts on 02/20/2020 at 3:16 PM to discuss the potential use of an new treatment for mild to moderate COVID-19 viral infection in non-hospitalized patients.  This patient is a 49 y.o. female that meets the FDA criteria for Emergency Use Authorization of casirivimab\imdevimab.  Has a (+) direct SARS-CoV-2 viral test result  Has mild or moderate COVID-19   Is ? 47 years of age and weighs ? 40 kg  Is NOT hospitalized due to COVID-19  Is NOT requiring oxygen therapy or requiring an increase in baseline oxygen flow rate due to COVID-19  Is within 10 days of symptom onset  Has at least one of the high risk factor(s) for progression to severe COVID-19 and/or hospitalization as defined in EUA.  Specific high risk criteria : Diabetes  Sx onset 02/16/20. Vaccinated  I have spoken and communicated the following to the patient or parent/caregiver:  1. FDA has authorized the emergency use of casirivimab\imdevimab for the treatment of mild to moderate COVID-19 in adults and pediatric patients with positive results of direct SARS-CoV-2 viral testing who are 11 years of age and older weighing at least 40 kg, and who are at high risk for progressing to severe COVID-19 and/or hospitalization.  2. The significant known and potential risks and benefits of casirivimab\imdevimab, and the extent to which such potential risks and benefits are unknown.  3. Information on available alternative treatments and the risks and benefits of those alternatives, including clinical trials.  4. Patients treated with casirivimab\imdevimab should continue to self-isolate and use infection control measures (e.g., wear mask, isolate, social distance, avoid sharing personal items, clean and disinfect "high touch" surfaces, and frequent handwashing) according to CDC guidelines.   5. The patient or parent/caregiver has the option to accept or refuse casirivimab\imdevimab .  After reviewing  this information with the patient, The patient agreed to proceed with receiving casirivimab\imdevimab infusion and will be provided a copy of the Fact sheet prior to receiving the infusion.Beckey Rutter, [place], AGNP-C [phone] ([place])

## 2020-02-22 ENCOUNTER — Ambulatory Visit (HOSPITAL_COMMUNITY)
Admission: RE | Admit: 2020-02-22 | Discharge: 2020-02-22 | Disposition: A | Discharge status: Home / Self Care | Payer: Managed Care, Other (non HMO) | Source: Ambulatory Visit | Attending: Pulmonary Disease | Admitting: Pulmonary Disease

## 2020-02-22 DIAGNOSIS — U071 COVID-19: Secondary | ICD-10-CM | POA: Insufficient documentation

## 2020-02-22 MED ORDER — ALBUTEROL SULFATE HFA 108 (90 BASE) MCG/ACT IN AERS: 2.0000 | INHALATION_SPRAY | Freq: Once | RESPIRATORY_TRACT | Status: DC | PRN

## 2020-02-22 MED ORDER — DIPHENHYDRAMINE HCL 50 MG/ML IJ SOLN: 50.0000 mg | Freq: Once | INTRAMUSCULAR | Status: DC | PRN

## 2020-02-22 MED ORDER — EPINEPHRINE 0.3 MG/0.3ML IJ SOAJ: 0.3000 mg | Freq: Once | INTRAMUSCULAR | Status: DC | PRN

## 2020-02-22 MED ORDER — SODIUM CHLORIDE 0.9 % IV SOLN
1200.0000 mg | Freq: Once | INTRAVENOUS | Status: AC
  Administered 2020-02-22: 1200 mg via INTRAVENOUS
  Filled 2020-02-22: qty 10

## 2020-02-22 MED ORDER — FAMOTIDINE IN NACL 20-0.9 MG/50ML-% IV SOLN: 20.0000 mg | Freq: Once | INTRAVENOUS | Status: DC | PRN

## 2020-02-22 MED ORDER — METHYLPREDNISOLONE SODIUM SUCC 125 MG IJ SOLR: 125.0000 mg | Freq: Once | INTRAMUSCULAR | Status: DC | PRN

## 2020-02-22 MED ORDER — SODIUM CHLORIDE 0.9 % IV SOLN: INTRAVENOUS | Status: DC | PRN

## 2020-02-22 NOTE — Progress Notes (Addendum)
  Diagnosis: COVID-19  Physician: Dr Joya Gaskins  Procedure: [facility] Med: casirivimab\imdevimab infusion - Provided patient with casirivimab\imdevimab fact sheet for patients, parents and caregivers prior to infusion.  Complications: No immediate complications noted.  Discharge: Discharged home   Christella Noa Albarece 02/22/2020

## 2020-02-22 NOTE — Discharge Instructions (Signed)

## 2020-02-23 ENCOUNTER — Encounter: Payer: Self-pay | Admitting: Family Medicine

## 2020-02-25 ENCOUNTER — Other Ambulatory Visit: Payer: Self-pay | Admitting: Family Medicine

## 2020-02-25 MED ORDER — ONETOUCH VERIO VI STRP
ORAL_STRIP | 1 refills | Status: DC
Start: 2020-02-25 — End: 2020-04-24

## 2020-03-13 ENCOUNTER — Other Ambulatory Visit: Payer: Self-pay | Admitting: Internal Medicine

## 2020-03-25 ENCOUNTER — Other Ambulatory Visit: Payer: Self-pay | Admitting: Family Medicine

## 2020-04-12 ENCOUNTER — Other Ambulatory Visit: Payer: Self-pay | Admitting: Internal Medicine

## 2020-04-15 ENCOUNTER — Other Ambulatory Visit: Payer: Self-pay | Admitting: Family Medicine

## 2020-04-24 ENCOUNTER — Other Ambulatory Visit: Payer: Self-pay | Admitting: Family Medicine

## 2020-04-28 ENCOUNTER — Other Ambulatory Visit: Payer: Self-pay | Admitting: Internal Medicine

## 2020-05-06 ENCOUNTER — Other Ambulatory Visit: Payer: Self-pay

## 2020-05-06 ENCOUNTER — Telehealth: Payer: Self-pay | Admitting: *Deleted

## 2020-05-06 DIAGNOSIS — R739 Hyperglycemia, unspecified: Secondary | ICD-10-CM

## 2020-05-06 DIAGNOSIS — E785 Hyperlipidemia, unspecified: Secondary | ICD-10-CM

## 2020-05-06 NOTE — Telephone Encounter (Signed)
Pt has lab appt on 11/22 but no orders in Epic.  Please place orders if appropriate.

## 2020-05-06 NOTE — Telephone Encounter (Signed)
Labs added.

## 2020-05-07 NOTE — Telephone Encounter (Signed)
Thank you :)

## 2020-05-11 ENCOUNTER — Encounter: Payer: Self-pay | Admitting: Family Medicine

## 2020-05-11 ENCOUNTER — Other Ambulatory Visit (INDEPENDENT_AMBULATORY_CARE_PROVIDER_SITE_OTHER): Payer: Managed Care, Other (non HMO)

## 2020-05-11 ENCOUNTER — Other Ambulatory Visit: Payer: Self-pay

## 2020-05-11 DIAGNOSIS — R739 Hyperglycemia, unspecified: Secondary | ICD-10-CM | POA: Diagnosis not present

## 2020-05-11 DIAGNOSIS — E785 Hyperlipidemia, unspecified: Secondary | ICD-10-CM | POA: Diagnosis not present

## 2020-05-11 LAB — LIPID PANEL
Cholesterol: 130 mg/dL (ref 0–200)
HDL: 38.8 mg/dL — ABNORMAL LOW (ref >39.00)
LDL Cholesterol: 60 mg/dL (ref 0–99)
NonHDL: 91.28
Total CHOL/HDL Ratio: 3
Triglycerides: 157 mg/dL — ABNORMAL HIGH (ref 0.0–149.0)
VLDL: 31.4 mg/dL (ref 0.0–40.0)

## 2020-05-11 LAB — COMPREHENSIVE METABOLIC PANEL
ALT: 35 U/L (ref 0–35)
AST: 21 U/L (ref 0–37)
Albumin: 4.4 g/dL (ref 3.5–5.2)
Alkaline Phosphatase: 68 U/L (ref 39–117)
BUN: 14 mg/dL (ref 6–23)
CO2: 29 mEq/L (ref 19–32)
Calcium: 9.2 mg/dL (ref 8.4–10.5)
Chloride: 104 mEq/L (ref 96–112)
Creatinine, Ser: 0.78 mg/dL (ref 0.40–1.20)
GFR: 90.62 mL/min (ref >60.00)
Glucose, Bld: 182 mg/dL — ABNORMAL HIGH (ref 70–99)
Potassium: 4.7 mEq/L (ref 3.5–5.1)
Sodium: 140 mEq/L (ref 135–145)
Total Bilirubin: 0.4 mg/dL (ref 0.2–1.2)
Total Protein: 6.6 g/dL (ref 6.0–8.3)

## 2020-05-11 LAB — HEMOGLOBIN A1C: Hgb A1c MFr Bld: 7 % — ABNORMAL HIGH (ref 4.6–6.5)

## 2020-05-12 ENCOUNTER — Telehealth: Payer: Self-pay

## 2020-05-12 NOTE — Telephone Encounter (Signed)
-----   Message from [name], MD sent at 05/11/2020  5:52 PM EST ----- Notify sugar is improved but cholesterol is up slightly still. The current recommendation is that if you have diabetes then you should be on a statin so my recommendation is for her to start Atorvastatin 10 mg tabs, 1 tab po twice a week, maybe on Tuesday and Saturday. Disp #10 with 3 rf and recheck labs in 3 months

## 2020-05-12 NOTE — Telephone Encounter (Signed)
Pt aware of lab results and would like to know if she would be adding 2 on Tuesday and Saturday because she already taking 1 of the atorvastatin 10 mg daily.

## 2020-05-12 NOTE — Telephone Encounter (Signed)
Yes Atorvastatin 10 mg po daily x 5 days and 20 mg x 2 days

## 2020-05-13 ENCOUNTER — Other Ambulatory Visit: Payer: Self-pay | Admitting: Family Medicine

## 2020-05-13 NOTE — Telephone Encounter (Signed)
Patient notified

## 2020-05-18 ENCOUNTER — Other Ambulatory Visit: Payer: Self-pay

## 2020-05-18 ENCOUNTER — Telehealth (INDEPENDENT_AMBULATORY_CARE_PROVIDER_SITE_OTHER): Payer: Managed Care, Other (non HMO) | Admitting: Family Medicine

## 2020-05-18 DIAGNOSIS — K219 Gastro-esophageal reflux disease without esophagitis: Secondary | ICD-10-CM

## 2020-05-18 DIAGNOSIS — E785 Hyperlipidemia, unspecified: Secondary | ICD-10-CM | POA: Diagnosis not present

## 2020-05-18 DIAGNOSIS — F988 Other specified behavioral and emotional disorders with onset usually occurring in childhood and adolescence: Secondary | ICD-10-CM | POA: Diagnosis not present

## 2020-05-18 DIAGNOSIS — F418 Other specified anxiety disorders: Secondary | ICD-10-CM

## 2020-05-18 DIAGNOSIS — E119 Type 2 diabetes mellitus without complications: Secondary | ICD-10-CM

## 2020-05-18 MED ORDER — BUSPIRONE HCL 5 MG PO TABS: 5.0000 mg | ORAL_TABLET | Freq: Two times a day (BID) | ORAL | 1 refills | Status: DC | PRN

## 2020-05-18 NOTE — Patient Instructions (Signed)

## 2020-05-18 NOTE — Assessment & Plan Note (Signed)
Tolerating statin, encouraged heart healthy diet, avoid trans fats, minimize simple carbs and saturated fats. Increase exercise as tolerated 

## 2020-05-18 NOTE — Assessment & Plan Note (Signed)
Tolerating current meds

## 2020-05-18 NOTE — Progress Notes (Signed)
Virtual Visit via Video Note  I connected with Kristina Watts on 05/18/20 at  3:00 PM EST by Kristina video enabled telemedicine application and verified that I am speaking with the correct person using two identifiers.  Location: Patient: home, patient and provider in visit Provider: office,    I discussed the limitations of evaluation and management by telemedicine and the availability of in person appointments. The patient expressed understanding and agreed to proceed. Kristina Goff, LPN was able to get the patient set up in Kristina video visit.     Subjective:    Patient ID: Kristina Watts, female    DOB: 04-13-1971, 49 y.o.   MRN: [id number]  Chief Complaint  Patient presents with  . Follow-up    lab work    HPI Patient is in today for follow up on chronic medical concerns. No recent febrile illness or hospitalizations. She is tearful due to increased work stressors but otherwise she feels well. She has started NOOM and is loosing weight. Her sugars have improved as Kristina result. Denies CP/palp/SOB/HA/congestion/fevers/GI or GU c/o. Taking meds as prescribed  Past Medical History:  Diagnosis Date  . Acute bronchitis 11/19/2012  . ADD (attention deficit disorder) 04/27/2012  . Allergy    seasonal  . Asthma   . Back pain 11/07/2013  . Cervical cancer screening 12/23/2014  . Chicken pox as Kristina child  . Depression   . Depression with anxiety 04/18/2011  . Dermatitis 05/18/2011  . Fatigue 05/30/2011  . GDM (gestational diabetes mellitus) 04/19/2011  . History of gestational diabetes mellitus (GDM) 04/19/2011  . History of pancreatitis 04/19/2011  . Hyperlipidemia 04/27/2012  . LUQ pain 12/12/2011  . Migraine 08/23/2011  . Otitis media of right ear 05/30/2011  . Overweight(278.02) 04/19/2011  . Pancreatitis, recurrent 12/12/2011  . Pharyngitis 08/23/2011  . Pneumonia 1998  . Preventative health care 04/19/2011  . Recurrent cold sores   . 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 Reflux  12/12/2011  . Tinea corporis 05/18/2011    Past Surgical History:  Procedure Laterality Date  . APPENDECTOMY  2007  . CESAREAN SECTION  1-02 and 4-04  . CHOLECYSTECTOMY  2000    Family History  Problem Relation Age of Onset  . Thyroid disease Mother   . Hypertension Father   . Hyperlipidemia Father   . Heart disease Father   . Diabetes Father   . COPD Father        previous smoker  . Stroke Father   . Depression Father   . Restless legs syndrome Father   . Diabetes Brother   . Cancer Maternal Grandmother        Head and Neck  . Heart disease Paternal Grandmother   . Hyperlipidemia Paternal Grandmother   . Hypertension Paternal Grandmother   . Diabetes Paternal Grandmother   . Depression Paternal Grandmother   . Stroke Paternal Grandmother   . Restless legs syndrome Paternal Grandmother   . Heart disease Paternal Grandfather   . Hyperlipidemia Paternal Grandfather   . Hypertension Paternal Grandfather   . Stroke Paternal Grandfather   . Diabetes Paternal Grandfather     Social History   Socioeconomic History  . Marital status: Married    Spouse name: Not on file  . Number of children: Not on file  . Years of education: Not on file  . Highest education level: Not on file  Occupational History  . Not on file  Tobacco Use  . Smoking status: Never  Smoker  . Smokeless tobacco: Never Used  Substance and Sexual Activity  . Alcohol use: Yes    Comment: rarely  . Drug use: No  . Sexual activity: Yes    Partners: Male  Other Topics Concern  . Not on file  Social History Narrative  . Not on file   Social Determinants of Health   Financial Resource Strain:   . Difficulty of Paying Living Expenses: Not on file  Food Insecurity:   . Worried About Charity fundraiser in the Last Year: Not on file  . Ran Out of Food in the Last Year: Not on file  Transportation Needs:   . Lack of Transportation (Medical): Not on file  . Lack of Transportation (Non-Medical): Not  on file  Physical Activity:   . Days of Exercise per Week: Not on file  . Minutes of Exercise per Session: Not on file  Stress:   . Feeling of Stress : Not on file  Social Connections:   . Frequency of Communication with Friends and Family: Not on file  . Frequency of Social Gatherings with Friends and Family: Not on file  . Attends Religious Services: Not on file  . Active Member of Clubs or Organizations: Not on file  . Attends Archivist Meetings: Not on file  . Marital Status: Not on file  Intimate Partner Violence:   . Fear of Current or Ex-Partner: Not on file  . Emotionally Abused: Not on file  . Physically Abused: Not on file  . Sexually Abused: Not on file    Outpatient Medications Prior to Visit  Medication Sig Dispense Refill  . Acetaminophen (TYLENOL EXTRA STRENGTH PO) Take 2 tablets by mouth as needed.    Marland Kitchen amphetamine-dextroamphetamine (ADDERALL) 10 MG tablet Take 1 tablet (10 mg total) by mouth daily with breakfast. April 2021 30 tablet 0  . amphetamine-dextroamphetamine (ADDERALL) 10 MG tablet Take 1 tablet (10 mg total) by mouth daily. March 2021 30 tablet 0  . amphetamine-dextroamphetamine (ADDERALL) 10 MG tablet Take 1 tablet (10 mg total) by mouth daily. February 2021 30 tablet 0  . atorvastatin (LIPITOR) 10 MG tablet TAKE 1 TABLET BY MOUTH EVERY DAY 90 tablet 1  . Azelastine-Fluticasone 137-50 MCG/ACT SUSP Place into the nose.    . Blood Glucose Monitoring Suppl (ONETOUCH VERIO REFLECT) w/Device KIT USE TO CHECK SUGAR ONCE Kristina DAY AND AS NEEDED. DX CODE: E11.9 1 kit 0  . buPROPion (WELLBUTRIN XL) 150 MG 24 hr tablet Take 1 tablet (150 mg total) by mouth daily. 90 tablet 0  . Butalbital-Acetaminophen (BUTALBITAL-APAP) 50-325 MG TABS Take 1 tablet by mouth 3 (three) times daily as needed. 60 each 0  . famotidine (PEPCID) 40 MG tablet TAKE 1 TABLET BY MOUTH EVERY DAY 90 tablet 3  . ibuprofen (ADVIL,MOTRIN) 200 MG tablet Take 200 mg by mouth every 6 (six)  hours as needed.    Marland Kitchen ketoconazole (NIZORAL) 2 % cream Apply 1 application topically daily. 30 g 1  . Lancets (ONETOUCH DELICA PLUS [id number]) MISC Use to check sugar once Kristina day and as needed.  Dx code: E11.9 100 each 1  . levocetirizine (XYZAL) 5 MG tablet Take 5 mg by mouth every evening.    . metFORMIN (GLUCOPHAGE) 1000 MG tablet TAKE 1 TABLET (1,000 MG TOTAL) BY MOUTH 2 (TWO) TIMES DAILY WITH Kristina MEAL. 180 tablet 1  . metFORMIN (GLUCOPHAGE) 500 MG tablet TAKE 1 TABLET BY MOUTH EVERY DAY WITH BREAKFAST 90 tablet 1  .  montelukast (SINGULAIR) 10 MG tablet Take 1 tablet (10 mg total) by mouth at bedtime. 90 tablet 0  . Multiple Vitamin (MULTIVITAMIN) tablet Take 1 tablet by mouth daily.    Glory Rosebush VERIO test strip USE TO CHECK SUGAR TWICE Kristina DAY AND AS NEEDED. DX CODE: E11.65 100 strip 12  . promethazine (PHENERGAN) 25 MG tablet Take 1 tablet (25 mg total) by mouth every 8 (eight) hours as needed for nausea or vomiting. 30 tablet 1  . SYMBICORT 160-4.5 MCG/ACT inhaler TAKE 2 PUFFS BY MOUTH TWICE Kristina DAY 10.2 g 11  . valACYclovir (VALTREX) 1000 MG tablet TAKE 1 TABLET BY MOUTH TWICE Kristina DAY 60 tablet 1  . venlafaxine XR (EFFEXOR-XR) 75 MG 24 hr capsule Take 1 capsule (75 mg total) by mouth 2 (two) times daily with breakfast and lunch. 180 capsule 1   No facility-administered medications prior to visit.    Allergies  Allergen Reactions  . Actifed [Triprolidine-Pse] Hives    Review of Systems  Constitutional: Negative for fever and malaise/fatigue.  HENT: Negative for congestion.   Eyes: Negative for blurred vision.  Respiratory: Negative for shortness of breath.   Cardiovascular: Negative for chest pain, palpitations and leg swelling.  Gastrointestinal: Negative for abdominal pain, blood in stool and nausea.  Genitourinary: Negative for dysuria and frequency.  Musculoskeletal: Negative for falls.  Skin: Negative for rash.  Neurological: Negative for dizziness, loss of consciousness and  headaches.  Endo/Heme/Allergies: Negative for environmental allergies.  Psychiatric/Behavioral: Negative for depression. The patient is not nervous/anxious.        Objective:    Physical Exam  There were no vitals taken for this visit. Wt Readings from Last 3 Encounters:  02/13/20 228 lb 12.8 oz (103.8 kg)  07/25/19 238 lb 6.4 oz (108.1 kg)  02/14/19 239 lb (108.4 kg)    Diabetic Foot Exam - Simple   No data filed     Lab Results  Component Value Date   WBC 6.7 01/31/2020   HGB 12.9 01/31/2020   HCT 37.4 01/31/2020   PLT 283.0 01/31/2020   GLUCOSE 182 (H) 05/11/2020   CHOL 130 05/11/2020   TRIG 157.0 (H) 05/11/2020   HDL 38.80 (L) 05/11/2020   LDLDIRECT 108.0 07/25/2019   LDLCALC 60 05/11/2020   ALT 35 05/11/2020   AST 21 05/11/2020   NA 140 05/11/2020   K 4.7 05/11/2020   CL 104 05/11/2020   CREATININE 0.78 05/11/2020   BUN 14 05/11/2020   CO2 29 05/11/2020   TSH 1.36 01/31/2020   HGBA1C 7.0 (H) 05/11/2020    Lab Results  Component Value Date   TSH 1.36 01/31/2020   Lab Results  Component Value Date   WBC 6.7 01/31/2020   HGB 12.9 01/31/2020   HCT 37.4 01/31/2020   MCV 86.7 01/31/2020   PLT 283.0 01/31/2020   Lab Results  Component Value Date   NA 140 05/11/2020   K 4.7 05/11/2020   CO2 29 05/11/2020   GLUCOSE 182 (H) 05/11/2020   BUN 14 05/11/2020   CREATININE 0.78 05/11/2020   BILITOT 0.4 05/11/2020   ALKPHOS 68 05/11/2020   AST 21 05/11/2020   ALT 35 05/11/2020   PROT 6.6 05/11/2020   ALBUMIN 4.4 05/11/2020   CALCIUM 9.2 05/11/2020   GFR 90.62 05/11/2020   Lab Results  Component Value Date   CHOL 130 05/11/2020   Lab Results  Component Value Date   HDL 38.80 (L) 05/11/2020   Lab Results  Component  Value Date   LDLCALC 60 05/11/2020   Lab Results  Component Value Date   TRIG 157.0 (H) 05/11/2020   Lab Results  Component Value Date   CHOLHDL 3 05/11/2020   Lab Results  Component Value Date   HGBA1C 7.0 (H) 05/11/2020        Assessment & Plan:   Problem List Items Addressed This Visit    Depression with anxiety (Chronic)    She is tearful in the visit and 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 Will add Buspar 5 mg to use twice daily as needed. She will let us know if she needs further consideration. Continue Venlafaxine and Wellbutrin for now.      Relevant Medications   busPIRone (BUSPAR) 5 MG tablet   GERD (gastroesophageal reflux disease) - Primary   Relevant Orders   CBC   TSH   Hyperlipidemia    Tolerating statin, encouraged heart healthy diet, avoid trans fats, minimize simple carbs and saturated fats. Increase exercise as tolerated      Relevant Orders   Lipid panel   TSH   ADD (attention deficit disorder)    Tolerating current meds      Diabetes mellitus without complication (HCC)    RCBU3A unacceptable, minimize simple carbs. Increase exercise as tolerated. Continue current meds but she has been doing NOOM for several weeks and is eating better with less carbs. Will continue this course and consider more meds with next blood draw in 3 months      Relevant Orders   Hemoglobin A1c   Comprehensive metabolic panel   TSH      I am having Witney Kristina. Gaubert "Ann" start on busPIRone. I am also having her maintain her multivitamin, Acetaminophen (TYLENOL EXTRA STRENGTH PO), ibuprofen, montelukast, valACYclovir, levocetirizine, Azelastine-Fluticasone, Symbicort, promethazine, Butalbital-APAP, amphetamine-dextroamphetamine, amphetamine-dextroamphetamine, amphetamine-dextroamphetamine, ketoconazole, atorvastatin, famotidine, OneTouch Delica Plus [id number], venlafaxine XR, OneTouch Verio Reflect, metFORMIN, OneTouch Verio, metFORMIN, and buPROPion.  Meds ordered this encounter  Medications  . busPIRone (BUSPAR) 5 MG tablet    Sig: Take 1 tablet (5 mg total) by mouth 2 (two) times daily as needed.    Dispense:  40 tablet    Refill:  1    I  discussed the assessment and treatment plan with the patient. The patient was provided an opportunity to ask questions and all were answered. The patient agreed with the plan and demonstrated an understanding of the instructions.   The patient was advised to call back or seek an in-person evaluation if the symptoms worsen or if the condition fails to improve as anticipated.  I provided 20 minutes of non-face-to-face time during this encounter.   Penni Homans, MD

## 2020-05-18 NOTE — Assessment & Plan Note (Signed)
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 Will add Buspar 5 mg to use twice daily as needed. She will let us know if she needs further consideration. Continue Venlafaxine and Wellbutrin for now.

## 2020-05-18 NOTE — Assessment & Plan Note (Addendum)
hgba1c unacceptable, minimize simple carbs. Increase exercise as tolerated. Continue current meds but she has been doing NOOM for several weeks and is eating better with less carbs. Will continue this course and consider more meds with next blood draw in 3 months

## 2020-07-15 ENCOUNTER — Other Ambulatory Visit: Payer: Self-pay | Admitting: Family Medicine

## 2020-08-08 ENCOUNTER — Other Ambulatory Visit: Payer: Self-pay | Admitting: Family Medicine

## 2020-09-24 ENCOUNTER — Encounter: Payer: Self-pay | Admitting: Family Medicine

## 2020-09-24 ENCOUNTER — Telehealth: Payer: Managed Care, Other (non HMO) | Admitting: Physician Assistant

## 2020-09-24 ENCOUNTER — Other Ambulatory Visit: Payer: Self-pay | Admitting: Family Medicine

## 2020-09-24 DIAGNOSIS — R300 Dysuria: Secondary | ICD-10-CM | POA: Diagnosis not present

## 2020-09-24 MED ORDER — CEPHALEXIN 500 MG PO CAPS: 500.0000 mg | ORAL_CAPSULE | Freq: Two times a day (BID) | ORAL | 0 refills | Status: AC

## 2020-09-24 MED ORDER — NITROFURANTOIN MONOHYD MACRO 100 MG PO CAPS: 100.0000 mg | ORAL_CAPSULE | Freq: Two times a day (BID) | ORAL | 0 refills | Status: DC

## 2020-09-24 NOTE — Progress Notes (Signed)

## 2020-09-24 NOTE — Progress Notes (Signed)
I have spent 5 minutes in review of e-visit questionnaire, review and updating patient chart, medical decision making and response to patient.   Linsay Vogt Cody Tish Begin, PA-C    

## 2020-10-15 ENCOUNTER — Other Ambulatory Visit: Payer: Self-pay | Admitting: Family Medicine

## 2020-11-06 ENCOUNTER — Other Ambulatory Visit: Payer: Self-pay | Admitting: Family Medicine

## 2020-11-09 MED ORDER — BUPROPION HCL ER (XL) 150 MG PO TB24
1.0000 | ORAL_TABLET | Freq: Every day | ORAL | 0 refills | Status: DC
Start: 2020-11-09 — End: 2021-05-11

## 2020-11-26 ENCOUNTER — Telehealth: Payer: Self-pay | Admitting: Family Medicine

## 2020-11-26 NOTE — Telephone Encounter (Signed)
Patient would like to know when she is due for lab appt

## 2020-11-26 NOTE — Telephone Encounter (Signed)
Spoke with pt and let her know we will do labs at her next appointment 03/08/21

## 2020-12-22 ENCOUNTER — Other Ambulatory Visit (HOSPITAL_BASED_OUTPATIENT_CLINIC_OR_DEPARTMENT_OTHER): Payer: Self-pay | Admitting: Family Medicine

## 2020-12-22 DIAGNOSIS — Z1231 Encounter for screening mammogram for malignant neoplasm of breast: Secondary | ICD-10-CM

## 2020-12-23 ENCOUNTER — Other Ambulatory Visit: Payer: Self-pay | Admitting: Family Medicine

## 2020-12-28 ENCOUNTER — Inpatient Hospital Stay (HOSPITAL_BASED_OUTPATIENT_CLINIC_OR_DEPARTMENT_OTHER): Admission: RE | Admit: 2020-12-28 | Payer: Managed Care, Other (non HMO) | Source: Ambulatory Visit

## 2021-01-13 ENCOUNTER — Other Ambulatory Visit: Payer: Self-pay | Admitting: Family Medicine

## 2021-02-05 ENCOUNTER — Other Ambulatory Visit: Payer: Self-pay | Admitting: Family Medicine

## 2021-03-08 ENCOUNTER — Encounter: Payer: Self-pay | Admitting: Family Medicine

## 2021-03-08 ENCOUNTER — Other Ambulatory Visit: Payer: Self-pay

## 2021-03-08 ENCOUNTER — Ambulatory Visit (INDEPENDENT_AMBULATORY_CARE_PROVIDER_SITE_OTHER): Payer: Managed Care, Other (non HMO) | Admitting: Family Medicine

## 2021-03-08 VITALS — BP 132/87 | HR 93 | Temp 98.3°F | Resp 16 | Ht 65.0 in | Wt 228.2 lb

## 2021-03-08 DIAGNOSIS — J40 Bronchitis, not specified as acute or chronic: Secondary | ICD-10-CM | POA: Diagnosis not present

## 2021-03-08 DIAGNOSIS — Z0000 Encounter for general adult medical examination without abnormal findings: Secondary | ICD-10-CM | POA: Diagnosis not present

## 2021-03-08 DIAGNOSIS — E785 Hyperlipidemia, unspecified: Secondary | ICD-10-CM

## 2021-03-08 DIAGNOSIS — Z79899 Other long term (current) drug therapy: Secondary | ICD-10-CM

## 2021-03-08 DIAGNOSIS — F988 Other specified behavioral and emotional disorders with onset usually occurring in childhood and adolescence: Secondary | ICD-10-CM | POA: Diagnosis not present

## 2021-03-08 DIAGNOSIS — Z1211 Encounter for screening for malignant neoplasm of colon: Secondary | ICD-10-CM | POA: Diagnosis not present

## 2021-03-08 DIAGNOSIS — E119 Type 2 diabetes mellitus without complications: Secondary | ICD-10-CM | POA: Diagnosis not present

## 2021-03-08 MED ORDER — VALACYCLOVIR HCL 1 G PO TABS: 1000.0000 mg | ORAL_TABLET | Freq: Two times a day (BID) | ORAL | 1 refills | Status: DC

## 2021-03-08 MED ORDER — METHYLPREDNISOLONE 4 MG PO TABS: ORAL_TABLET | ORAL | 0 refills | Status: DC

## 2021-03-08 MED ORDER — CEFDINIR 300 MG PO CAPS: 300.0000 mg | ORAL_CAPSULE | Freq: Two times a day (BID) | ORAL | 0 refills | Status: AC

## 2021-03-08 NOTE — Patient Instructions (Signed)
Preventive Care 40-48 Years Old, Female Preventive care refers to lifestyle choices and visits with your health care provider that can promote health and wellness. This includes: A yearly physical exam. This is also called an annual wellness visit. Regular dental and eye exams. Immunizations. Screening for certain conditions. Healthy lifestyle choices, such as: Eating a healthy diet. Getting regular exercise. Not using drugs or products that contain nicotine and tobacco. Limiting alcohol use. What can I expect for my preventive care visit? Physical exam Your health care provider will check your: Height and weight. These may be used to calculate your BMI (body mass index). BMI is a measurement that tells if you are at a healthy weight. Heart rate and blood pressure. Body temperature. Skin for abnormal spots. Counseling Your health care provider may ask you questions about your: Past medical problems. Family's medical history. Alcohol, tobacco, and drug use. Emotional well-being. Home life and relationship well-being. Sexual activity. Diet, exercise, and sleep habits. Work and work environment. Access to firearms. Method of birth control. Menstrual cycle. Pregnancy history. What immunizations do I need? Vaccines are usually given at various ages, according to a schedule. Your health care provider will recommend vaccines for you based on your age, medical history, and lifestyle or other factors, such as travel or where you work. What tests do I need? Blood tests Lipid and cholesterol levels. These may be checked every 5 years, or more often if you are over 50 years old. Hepatitis C test. Hepatitis B test. Screening Lung cancer screening. You may have this screening every year starting at age 55 if you have a 30-pack-year history of smoking and currently smoke or have quit within the past 15 years. Colorectal cancer screening. All adults should have this screening starting at  age 50 and continuing until age 75. Your health care provider may recommend screening at age 45 if you are at increased risk. You will have tests every 1-10 years, depending on your results and the type of screening test. Diabetes screening. This is done by checking your blood sugar (glucose) after you have not eaten for a while (fasting). You may have this done every 1-3 years. Mammogram. This may be done every 1-2 years. Talk with your health care provider about when you should start having regular mammograms. This may depend on whether you have a family history of breast cancer. BRCA-related cancer screening. This may be done if you have a family history of breast, ovarian, tubal, or peritoneal cancers. Pelvic exam and Pap test. This may be done every 3 years starting at age 21. Starting at age 30, this may be done every 5 years if you have a Pap test in combination with an HPV test. Other tests STD (sexually transmitted disease) testing, if you are at risk. Bone density scan. This is done to screen for osteoporosis. You may have this scan if you are at high risk for osteoporosis. Talk with your health care provider about your test results, treatment options, and if necessary, the need for more tests. Follow these instructions at home: Eating and drinking  Eat a diet that includes fresh fruits and vegetables, whole grains, lean protein, and low-fat dairy products. Take vitamin and mineral supplements as recommended by your health care provider. Do not drink alcohol if: Your health care provider tells you not to drink. You are pregnant, may be pregnant, or are planning to become pregnant. If you drink alcohol: Limit how much you have to 0-1 drink a day. Be   aware of how much alcohol is in your drink. In the [place] one drink equals one 12 oz bottle of beer (355 mL), one 5 oz glass of wine (148 mL), or one 1 oz glass of hard liquor (44 mL). Lifestyle Take daily care of your teeth and  gums. Brush your teeth every morning and night with fluoride toothpaste. Floss one time each day. Stay active. Exercise for at least 30 minutes 5 or more days each week. Do not use any products that contain nicotine or tobacco, such as cigarettes, e-cigarettes, and chewing tobacco. If you need help quitting, ask your health care provider. Do not use drugs. If you are sexually active, practice safe sex. Use a condom or other form of protection to prevent STIs (sexually transmitted infections). If you do not wish to become pregnant, use a form of birth control. If you plan to become pregnant, see your health care provider for a prepregnancy visit. If told by your health care provider, take low-dose aspirin daily starting at age 63. Find healthy ways to cope with stress, such as: Meditation, yoga, or listening to music. Journaling. Talking to a trusted person. Spending time with friends and family. Safety Always wear your seat belt while driving or riding in a vehicle. Do not drive: If you have been drinking alcohol. Do not ride with someone who has been drinking. When you are tired or distracted. While texting. Wear a helmet and other protective equipment during sports activities. If you have firearms in your house, make sure you follow all gun safety procedures. What's next? Visit your health care provider once a year for an annual wellness visit. Ask your health care provider how often you should have your eyes and teeth checked. Stay up to date on all vaccines. This information is not intended to replace advice given to you by your health care provider. Make sure you discuss any questions you have with your health care provider. Document Revised: 08/14/2020 Document Reviewed: 02/15/2018 Elsevier Patient Education  2022 [employer].

## 2021-03-08 NOTE — Assessment & Plan Note (Signed)
hgba1c acceptable, minimize simple carbs. Increase exercise as tolerated. Continue current meds 

## 2021-03-08 NOTE — Assessment & Plan Note (Addendum)
Stable on current meds. No changes 

## 2021-03-08 NOTE — Progress Notes (Signed)
Subjective:   By signing my name below, I, Zite Okoli, attest that this documentation has been prepared under the direction and in the presence of [name], MD. 03/08/2021   Patient ID: Kristina Watts, female    DOB: 1971/04/01, 50 y.o.   MRN: [id number]  Chief Complaint  Patient presents with   Annual Exam    Pt would like a spot on left calf looked at    HPI Patient is in today for a comprehensive physical exam.  She reports that last week Thursday, she started showing symptoms of cough, runny nose and then on Saturday, she was having chest pressure, tightness and congestion. She took the Covid-19 test twice now and has been negative. She has had low-grade fevers and has been using Advil and tylenol every 4-6 hours to manage these symptoms. She also started using 12 hour pseudoephedrine which caused sputum production. She reports the sputum as yellow and thick. She has also been using 1200 mg of mucinex.  She has had 2 UTI's and a yeast infection.  She does not check her sugar levels regularly.  She denies eye pain, palpitations, leg swelling, shortness of breath, nausea, abdominal pain, diarrhea and blood in stool. Also denies dysuria, frequency, back pain and headaches.   She has had 3 Pfizer Covid-19 vaccines at this time.  She is not UTD on vision care.  There has been no recent changes in her family medical history.  Past Medical History:  Diagnosis Date   Acute bronchitis 11/19/2012   ADD (attention deficit disorder) 04/27/2012   Allergy    seasonal   Asthma    Back pain 11/07/2013   Cervical cancer screening 12/23/2014   Chicken pox as a child   Depression    Depression with anxiety 04/18/2011   Dermatitis 05/18/2011   Fatigue 05/30/2011   GDM (gestational diabetes mellitus) 04/19/2011   History of gestational diabetes mellitus (GDM) 04/19/2011   History of pancreatitis 04/19/2011   Hyperlipidemia 04/27/2012   LUQ pain 12/12/2011   Migraine 08/23/2011    Otitis media of right ear 05/30/2011   Overweight(278.02) 04/19/2011   Pancreatitis, recurrent 12/12/2011   Pharyngitis 08/23/2011   Pneumonia 1998   Preventative health care 04/19/2011   Recurrent cold sores    Recurrent HSV (herpes simplex virus) 04/18/2011   Reflux 12/12/2011   Tinea corporis 05/18/2011    Past Surgical History:  Procedure Laterality Date   APPENDECTOMY  2007   CESAREAN SECTION  1-02 and 4-04   CHOLECYSTECTOMY  2000    Family History  Problem Relation Age of Onset   Thyroid disease Mother    Hypertension Father    Hyperlipidemia Father    Heart disease Father    Diabetes Father    COPD Father        previous smoker   Stroke Father    Depression Father    Restless legs syndrome Father    Diabetes Brother    Cancer Maternal Grandmother        Head and Neck   Heart disease Paternal Grandmother    Hyperlipidemia Paternal Grandmother    Hypertension Paternal Grandmother    Diabetes Paternal Grandmother    Depression Paternal Grandmother    Stroke Paternal Grandmother    Restless legs syndrome Paternal Grandmother    Heart disease Paternal Grandfather    Hyperlipidemia Paternal Grandfather    Hypertension Paternal Grandfather    Stroke Paternal Grandfather    Diabetes Paternal [employer] officer  Social History   Socioeconomic History   Marital status: Married    Spouse name: Not on file   Number of children: Not on file   Years of education: Not on file   Highest education level: Not on file  Occupational History   Not on file  Tobacco Use   Smoking status: Never   Smokeless tobacco: Never  Substance and Sexual Activity   Alcohol use: Yes    Comment: rarely   Drug use: No   Sexual activity: Yes    Partners: Male  Other Topics Concern   Not on file  Social History Narrative   Not on file   Social Determinants of Health   Financial Resource Strain: Not on file  Food Insecurity: Not on file  Transportation Needs: Not on file  Physical  Activity: Not on file  Stress: Not on file  Social Connections: Not on file  Intimate Partner Violence: Not on file    Outpatient Medications Prior to Visit  Medication Sig Dispense Refill   Acetaminophen (TYLENOL EXTRA STRENGTH PO) Take 2 tablets by mouth as needed.     amphetamine-dextroamphetamine (ADDERALL) 10 MG tablet Take 1 tablet (10 mg total) by mouth daily with breakfast. April 2021 30 tablet 0   amphetamine-dextroamphetamine (ADDERALL) 10 MG tablet Take 1 tablet (10 mg total) by mouth daily. March 2021 30 tablet 0   amphetamine-dextroamphetamine (ADDERALL) 10 MG tablet Take 1 tablet (10 mg total) by mouth daily. February 2021 30 tablet 0   atorvastatin (LIPITOR) 10 MG tablet TAKE 1 TABLET BY MOUTH EVERY DAY 90 tablet 1   Azelastine-Fluticasone 137-50 MCG/ACT SUSP Place into the nose.     Blood Glucose Monitoring Suppl (ONETOUCH VERIO REFLECT) w/Device KIT USE TO CHECK SUGAR ONCE A DAY AND AS NEEDED. DX CODE: E11.9 1 kit 0   buPROPion (WELLBUTRIN XL) 150 MG 24 hr tablet Take 1 tablet (150 mg total) by mouth daily. 90 tablet 0   busPIRone (BUSPAR) 5 MG tablet Take 1 tablet (5 mg total) by mouth 2 (two) times daily as needed. 40 tablet 1   Butalbital-Acetaminophen (BUTALBITAL-APAP) 50-325 MG TABS Take 1 tablet by mouth 3 (three) times daily as needed. 60 each 0   famotidine (PEPCID) 40 MG tablet TAKE 1 TABLET BY MOUTH EVERY DAY 90 tablet 1   ibuprofen (ADVIL,MOTRIN) 200 MG tablet Take 200 mg by mouth every 6 (six) hours as needed.     ketoconazole (NIZORAL) 2 % cream Apply 1 application topically daily. 30 g 1   Lancets (ONETOUCH DELICA PLUS [id number]) MISC Use to check sugar once a day and as needed.  Dx code: E11.9 100 each 1   levocetirizine (XYZAL) 5 MG tablet Take 5 mg by mouth every evening.     metFORMIN (GLUCOPHAGE) 1000 MG tablet TAKE 1 TABLET (1,000 MG TOTAL) BY MOUTH 2 (TWO) TIMES DAILY WITH A MEAL. 180 tablet 1   metFORMIN (GLUCOPHAGE) 500 MG tablet TAKE 1 TABLET BY MOUTH  EVERY DAY WITH BREAKFAST 90 tablet 1   montelukast (SINGULAIR) 10 MG tablet Take 1 tablet (10 mg total) by mouth at bedtime. 90 tablet 0   Multiple Vitamin (MULTIVITAMIN) tablet Take 1 tablet by mouth daily.     nitrofurantoin, macrocrystal-monohydrate, (MACROBID) 100 MG capsule Take 1 capsule (100 mg total) by mouth 2 (two) times daily. 6 capsule 0   ONETOUCH VERIO test strip USE TO CHECK SUGAR TWICE A DAY AND AS NEEDED. DX CODE: E11.65 100 strip 12   promethazine (  PHENERGAN) 25 MG tablet Take 1 tablet (25 mg total) by mouth every 8 (eight) hours as needed for nausea or vomiting. 30 tablet 1   SYMBICORT 160-4.5 MCG/ACT inhaler TAKE 2 PUFFS BY MOUTH TWICE A DAY 10.2 g 11   venlafaxine XR (EFFEXOR-XR) 75 MG 24 hr capsule TAKE 1 CAPSULE (75 MG TOTAL) BY MOUTH 2 (TWO) TIMES DAILY WITH BREAKFAST AND LUNCH. 180 capsule 1   valACYclovir (VALTREX) 1000 MG tablet TAKE 1 TABLET BY MOUTH TWICE A DAY 60 tablet 1   No facility-administered medications prior to visit.    Allergies  Allergen Reactions   Actifed [Triprolidine-Pse] Hives    Review of Systems  Constitutional:  Positive for fever.  HENT:  Positive for congestion.   Eyes:  Negative for pain.  Respiratory:  Positive for cough and sputum production. Negative for shortness of breath.   Cardiovascular:  Positive for chest pain. Negative for palpitations and leg swelling.       (+) chest tightness  Gastrointestinal:  Negative for abdominal pain, blood in stool, diarrhea and nausea.  Genitourinary:  Negative for dysuria and frequency.  Musculoskeletal:  Negative for back pain.  Neurological:  Negative for headaches.      Objective:    Physical Exam Constitutional:      General: She is not in acute distress.    Appearance: She is well-developed.  HENT:     Head: Normocephalic and atraumatic.     Right Ear: Tympanic membrane, ear canal and external ear normal.     Left Ear: Tympanic membrane, ear canal and external ear normal.  Eyes:      Conjunctiva/sclera: Conjunctivae normal.     Comments: No nystagmus  Neck:     Thyroid: No thyromegaly.  Cardiovascular:     Rate and Rhythm: Normal rate and regular rhythm.     Heart sounds: Normal heart sounds. No murmur heard. Pulmonary:     Effort: Pulmonary effort is normal. No respiratory distress.     Breath sounds: Examination of the right-upper field reveals wheezing. Examination of the left-upper field reveals wheezing. Examination of the right-middle field reveals wheezing. Examination of the left-middle field reveals wheezing. Examination of the right-lower field reveals wheezing. Examination of the left-lower field reveals wheezing. Wheezing present.  Abdominal:     General: Bowel sounds are normal. There is no distension.     Palpations: Abdomen is soft. There is no mass.     Tenderness: There is no abdominal tenderness.  Musculoskeletal:     Cervical back: Neck supple.     Comments: 5/5 strength in upper and lower extremities  Feet:     Comments: Diabetic Foot Exam - Simple   No data filed    Lymphadenopathy:     Cervical: No cervical adenopathy.  Skin:    General: Skin is warm and dry.     Comments: Seborrhea on left leg  Neurological:     Mental Status: She is alert and oriented to person, place, and time.     Deep Tendon Reflexes:     Reflex Scores:      Patellar reflexes are 2+ on the right side and 2+ on the left side. Psychiatric:        Behavior: Behavior normal.    BP 132/87   Pulse 93   Temp 98.3 F (36.8 C)   Resp 16   Ht '5\' 5"'  (1.651 m)   Wt 228 lb 3.2 oz (103.5 kg)   SpO2 95%  BMI 37.97 kg/m  Wt Readings from Last 3 Encounters:  03/08/21 228 lb 3.2 oz (103.5 kg)  02/13/20 228 lb 12.8 oz (103.8 kg)  07/25/19 238 lb 6.4 oz (108.1 kg)    Diabetic Foot Exam - Simple   No data filed    Lab Results  Component Value Date   WBC 6.7 01/31/2020   HGB 12.9 01/31/2020   HCT 37.4 01/31/2020   PLT 283.0 01/31/2020   GLUCOSE 182 (H)  05/11/2020   CHOL 130 05/11/2020   TRIG 157.0 (H) 05/11/2020   HDL 38.80 (L) 05/11/2020   LDLDIRECT 108.0 07/25/2019   LDLCALC 60 05/11/2020   ALT 35 05/11/2020   AST 21 05/11/2020   NA 140 05/11/2020   K 4.7 05/11/2020   CL 104 05/11/2020   CREATININE 0.78 05/11/2020   BUN 14 05/11/2020   CO2 29 05/11/2020   TSH 1.36 01/31/2020   HGBA1C 7.0 (H) 05/11/2020    Lab Results  Component Value Date   TSH 1.36 01/31/2020   Lab Results  Component Value Date   WBC 6.7 01/31/2020   HGB 12.9 01/31/2020   HCT 37.4 01/31/2020   MCV 86.7 01/31/2020   PLT 283.0 01/31/2020   Lab Results  Component Value Date   NA 140 05/11/2020   K 4.7 05/11/2020   CO2 29 05/11/2020   GLUCOSE 182 (H) 05/11/2020   BUN 14 05/11/2020   CREATININE 0.78 05/11/2020   BILITOT 0.4 05/11/2020   ALKPHOS 68 05/11/2020   AST 21 05/11/2020   ALT 35 05/11/2020   PROT 6.6 05/11/2020   ALBUMIN 4.4 05/11/2020   CALCIUM 9.2 05/11/2020   GFR 90.62 05/11/2020   Lab Results  Component Value Date   CHOL 130 05/11/2020   Lab Results  Component Value Date   HDL 38.80 (L) 05/11/2020   Lab Results  Component Value Date   LDLCALC 60 05/11/2020   Lab Results  Component Value Date   TRIG 157.0 (H) 05/11/2020   Lab Results  Component Value Date   CHOLHDL 3 05/11/2020   Lab Results  Component Value Date   HGBA1C 7.0 (H) 05/11/2020        Mammogram: Last completed on 10/01/2015. Results were normal. Repeat in 1 year. Due Pap Smear: Last completed on 12/23/2014. Results were normal. Repeat in 3 years. Due  Assessment & Plan:   Problem List Items Addressed This Visit     Preventative health care    Patient encouraged to maintain heart healthy diet, regular exercise, adequate sleep. Consider daily probiotics. Take medications as prescribed. Labs ordered and reviewed.      Relevant Orders   Hemoglobin A1c   CBC with Differential/Platelet   Comprehensive metabolic panel   Lipid panel   TSH    Hyperlipidemia   Relevant Orders   CBC with Differential/Platelet   Comprehensive metabolic panel   Lipid panel   TSH   ADD (attention deficit disorder)    Stable on current meds. No changes      Bronchitis    Cefdinir and albuterol prn      Diabetes mellitus without complication (HCC) - Primary    hgba1c acceptable, minimize simple carbs. Increase exercise as tolerated. Continue current meds      Relevant Orders   Hemoglobin A1c   Other Visit Diagnoses     High risk medication use       Relevant Orders   DRUG MONITORING, PANEL 8 WITH CONFIRMATION, URINE   Colon cancer screening  Relevant Orders   Ambulatory referral to Gastroenterology       Meds ordered this encounter  Medications   methylPREDNISolone (MEDROL) 4 MG tablet    Sig: 5 tabs po x 1 day then 4 tabs po x 1 day then 3 tabs po x 1 day then 2 tabs po x 1 day then 1 tab po x 1 day and stop    Dispense:  15 tablet    Refill:  0   cefdinir (OMNICEF) 300 MG capsule    Sig: Take 1 capsule (300 mg total) by mouth 2 (two) times daily for 10 days.    Dispense:  20 capsule    Refill:  0    I,Zite Okoli,acting as a scribe for Penni Homans, MD.,have documented all relevant documentation on the behalf of Penni Homans, MD,as directed by  Penni Homans, MD while in the presence of Penni Homans, MD.   I, [name], MD., personally preformed the services described in this documentation.  All medical record entries made by the scribe were at my direction and in my presence.  I have reviewed the chart and discharge instructions (if applicable) and agree that the record reflects my personal performance and is accurate and complete. 03/08/2021

## 2021-03-10 ENCOUNTER — Ambulatory Visit: Payer: Managed Care, Other (non HMO)

## 2021-03-12 NOTE — Assessment & Plan Note (Signed)
Patient encouraged to maintain heart healthy diet, regular exercise, adequate sleep. Consider daily probiotics. Take medications as prescribed. Labs ordered and reviewed 

## 2021-03-12 NOTE — Assessment & Plan Note (Signed)
Cefdinir and albuterol prn

## 2021-04-27 ENCOUNTER — Other Ambulatory Visit: Payer: Self-pay | Admitting: Family Medicine

## 2021-04-27 NOTE — Telephone Encounter (Signed)
Patient is requesting a refill of the following medications: Requested Prescriptions   Pending Prescriptions Disp Refills   venlafaxine XR (EFFEXOR-XR) 75 MG 24 hr capsule [Pharmacy Med Name: VENLAFAXINE HCL ER 75 MG CAP] 180 capsule 1    Sig: TAKE 1 CAPSULE (75 MG TOTAL) BY MOUTH 2 (TWO) TIMES DAILY WITH BREAKFAST AND LUNCH.    Date of patient request: 04/26/21 Last office visit: 03/08/21 Date of last refill: 10/15/20 Last refill amount: 180 + 1 Follow up time period per chart: 07/22/21

## 2021-05-11 ENCOUNTER — Other Ambulatory Visit: Payer: Self-pay | Admitting: Family Medicine

## 2021-06-09 ENCOUNTER — Encounter: Payer: Self-pay | Admitting: Family Medicine

## 2021-06-09 DIAGNOSIS — E041 Nontoxic single thyroid nodule: Secondary | ICD-10-CM

## 2021-06-10 ENCOUNTER — Other Ambulatory Visit: Payer: Self-pay | Admitting: Family Medicine

## 2021-06-10 DIAGNOSIS — E079 Disorder of thyroid, unspecified: Secondary | ICD-10-CM

## 2021-06-11 ENCOUNTER — Other Ambulatory Visit (INDEPENDENT_AMBULATORY_CARE_PROVIDER_SITE_OTHER): Payer: Managed Care, Other (non HMO)

## 2021-06-11 ENCOUNTER — Ambulatory Visit (HOSPITAL_BASED_OUTPATIENT_CLINIC_OR_DEPARTMENT_OTHER)
Admission: RE | Admit: 2021-06-11 | Discharge: 2021-06-11 | Disposition: A | Discharge status: Home / Self Care | Payer: Managed Care, Other (non HMO) | Source: Ambulatory Visit | Attending: Family Medicine | Admitting: Family Medicine

## 2021-06-11 ENCOUNTER — Other Ambulatory Visit: Payer: Self-pay

## 2021-06-11 DIAGNOSIS — E785 Hyperlipidemia, unspecified: Secondary | ICD-10-CM

## 2021-06-11 DIAGNOSIS — E119 Type 2 diabetes mellitus without complications: Secondary | ICD-10-CM

## 2021-06-11 DIAGNOSIS — Z0000 Encounter for general adult medical examination without abnormal findings: Secondary | ICD-10-CM | POA: Diagnosis not present

## 2021-06-11 DIAGNOSIS — Z79899 Other long term (current) drug therapy: Secondary | ICD-10-CM

## 2021-06-11 DIAGNOSIS — E079 Disorder of thyroid, unspecified: Secondary | ICD-10-CM | POA: Insufficient documentation

## 2021-06-11 LAB — CBC WITH DIFFERENTIAL/PLATELET
Basophils Absolute: 0 10*3/uL (ref 0.0–0.1)
Basophils Relative: 0.4 % (ref 0.0–3.0)
Eosinophils Absolute: 0.1 10*3/uL (ref 0.0–0.7)
Eosinophils Relative: 0.9 % (ref 0.0–5.0)
HCT: 37.9 % (ref 36.0–46.0)
Hemoglobin: 12.8 g/dL (ref 12.0–15.0)
Lymphocytes Relative: 18.1 % (ref 12.0–46.0)
Lymphs Abs: 1.5 10*3/uL (ref 0.7–4.0)
MCHC: 33.8 g/dL (ref 30.0–36.0)
MCV: 86.7 fl (ref 78.0–100.0)
Monocytes Absolute: 0.5 10*3/uL (ref 0.1–1.0)
Monocytes Relative: 6.3 % (ref 3.0–12.0)
Neutro Abs: 6.1 10*3/uL (ref 1.4–7.7)
Neutrophils Relative %: 74.3 % (ref 43.0–77.0)
Platelets: 299 10*3/uL (ref 150.0–400.0)
RBC: 4.38 Mil/uL (ref 3.87–5.11)
RDW: 14 % (ref 11.5–15.5)
WBC: 8.2 10*3/uL (ref 4.0–10.5)

## 2021-06-11 LAB — COMPREHENSIVE METABOLIC PANEL
ALT: 111 U/L — ABNORMAL HIGH (ref 0–35)
AST: 95 U/L — ABNORMAL HIGH (ref 0–37)
Albumin: 4.4 g/dL (ref 3.5–5.2)
Alkaline Phosphatase: 76 U/L (ref 39–117)
BUN: 12 mg/dL (ref 6–23)
CO2: 28 mEq/L (ref 19–32)
Calcium: 9.1 mg/dL (ref 8.4–10.5)
Chloride: 101 mEq/L (ref 96–112)
Creatinine, Ser: 0.69 mg/dL (ref 0.40–1.20)
GFR: 102.76 mL/min (ref >60.00)
Glucose, Bld: 179 mg/dL — ABNORMAL HIGH (ref 70–99)
Potassium: 4.8 mEq/L (ref 3.5–5.1)
Sodium: 137 mEq/L (ref 135–145)
Total Bilirubin: 0.6 mg/dL (ref 0.2–1.2)
Total Protein: 7 g/dL (ref 6.0–8.3)

## 2021-06-11 LAB — LIPID PANEL
Cholesterol: 197 mg/dL (ref 0–200)
HDL: 42.9 mg/dL (ref >39.00)
NonHDL: 154.03
Total CHOL/HDL Ratio: 5
Triglycerides: 219 mg/dL — ABNORMAL HIGH (ref 0.0–149.0)
VLDL: 43.8 mg/dL — ABNORMAL HIGH (ref 0.0–40.0)

## 2021-06-11 LAB — DRUG MONITORING, PANEL 8 WITH CONFIRMATION, URINE

## 2021-06-11 LAB — TSH: TSH: 2.44 u[IU]/mL (ref 0.35–5.50)

## 2021-06-11 LAB — DM TEMPLATE

## 2021-06-11 LAB — LDL CHOLESTEROL, DIRECT: Direct LDL: 146 mg/dL

## 2021-06-11 LAB — HEMOGLOBIN A1C: Hgb A1c MFr Bld: 8.1 % — ABNORMAL HIGH (ref 4.6–6.5)

## 2021-06-11 NOTE — Addendum Note (Signed)
Addended by: CREFT, Kristine Garbe L on: 06/11/2021 11:40 AM   Modules accepted: Orders

## 2021-06-15 ENCOUNTER — Other Ambulatory Visit: Payer: Self-pay | Admitting: Family Medicine

## 2021-06-15 ENCOUNTER — Telehealth: Payer: Self-pay | Admitting: Family Medicine

## 2021-06-15 DIAGNOSIS — E041 Nontoxic single thyroid nodule: Secondary | ICD-10-CM

## 2021-06-15 NOTE — Telephone Encounter (Signed)
Pt states at her ultrasound they told her to get a biopsy done, and would like a referral sent in so she can get seen before the end of the year. Please advise.

## 2021-06-15 NOTE — Telephone Encounter (Signed)
Pt aware.

## 2021-06-22 ENCOUNTER — Other Ambulatory Visit: Payer: Self-pay | Admitting: Family Medicine

## 2021-06-22 DIAGNOSIS — E041 Nontoxic single thyroid nodule: Secondary | ICD-10-CM

## 2021-06-23 NOTE — Telephone Encounter (Signed)
Provided patient number for [facility] for scheduling [phone]).

## 2021-06-23 NOTE — Addendum Note (Signed)
Addended by: Gerilyn Nestle on: 06/23/2021 11:51 AM   Modules accepted: Orders

## 2021-06-23 NOTE — Addendum Note (Signed)
Addended by: Gerilyn Nestle on: 06/23/2021 04:12 PM   Modules accepted: Orders

## 2021-06-23 NOTE — Telephone Encounter (Signed)
Spoke with patient regarding thyroid biopsy and surgery.  Due to the size and pain of nodule, patient requesting removal.  Pt requesting appointment after 07/02/21 for insurance purposes.  Will contact referral coordinator for updating to general surgeon.

## 2021-06-30 ENCOUNTER — Other Ambulatory Visit: Payer: Self-pay | Admitting: Family Medicine

## 2021-07-09 ENCOUNTER — Other Ambulatory Visit: Payer: Self-pay | Admitting: Family Medicine

## 2021-07-13 ENCOUNTER — Other Ambulatory Visit: Payer: Managed Care, Other (non HMO)

## 2021-07-15 ENCOUNTER — Other Ambulatory Visit: Payer: Self-pay | Admitting: Family Medicine

## 2021-07-19 ENCOUNTER — Other Ambulatory Visit: Payer: Self-pay | Admitting: General Surgery

## 2021-07-19 DIAGNOSIS — E041 Nontoxic single thyroid nodule: Secondary | ICD-10-CM

## 2021-07-22 ENCOUNTER — Encounter: Payer: Self-pay | Admitting: Family Medicine

## 2021-07-22 ENCOUNTER — Ambulatory Visit (INDEPENDENT_AMBULATORY_CARE_PROVIDER_SITE_OTHER): Payer: Managed Care, Other (non HMO) | Admitting: Family Medicine

## 2021-07-22 VITALS — BP 116/70 | HR 94 | Temp 97.9°F | Resp 16 | Wt 225.0 lb

## 2021-07-22 DIAGNOSIS — E785 Hyperlipidemia, unspecified: Secondary | ICD-10-CM

## 2021-07-22 DIAGNOSIS — F988 Other specified behavioral and emotional disorders with onset usually occurring in childhood and adolescence: Secondary | ICD-10-CM

## 2021-07-22 DIAGNOSIS — Z23 Encounter for immunization: Secondary | ICD-10-CM | POA: Diagnosis not present

## 2021-07-22 DIAGNOSIS — E079 Disorder of thyroid, unspecified: Secondary | ICD-10-CM

## 2021-07-22 DIAGNOSIS — E119 Type 2 diabetes mellitus without complications: Secondary | ICD-10-CM

## 2021-07-22 DIAGNOSIS — R739 Hyperglycemia, unspecified: Secondary | ICD-10-CM | POA: Diagnosis not present

## 2021-07-22 DIAGNOSIS — Z1211 Encounter for screening for malignant neoplasm of colon: Secondary | ICD-10-CM | POA: Diagnosis not present

## 2021-07-22 NOTE — Patient Instructions (Addendum)
Magnesium glycinate 200-400 mg L Typtophan capsules for sleep   Goiter A goiter is an enlarged thyroid gland. The thyroid is located in the lower front of the neck. It makes hormones that affect many body parts and systems, including the system that affects how quickly the body burns fuel for energy (metabolism). Most goiters are painless and are not a cause for concern. Some goiters can affect the way your thyroid makes thyroid hormones. Goiters and conditions that cause goiters can be treated, if necessary. What are the causes? Common causes of this condition include: Lack (deficiency) of a mineral called iodine. The thyroid gland uses iodine to make thyroid hormones. Diseases that attack healthy cells in the body (autoimmune diseases) and affect thyroid function, such as Graves' disease or Hashimoto's disease. These diseases may cause the body to produce too much thyroid hormone (hyperthyroidism) or too little of the hormone (hypothyroidism). Conditions that cause inflammation of the thyroid (thyroiditis). One or more small growths on the thyroid (nodular goiter). Other causes include: Medical problems caused by abnormal genes that are passed from parent to child (genetic defects). Thyroid injury or infection. Tumors that may or may not be cancerous. Pregnancy. Certain medicines. Exposure to radiation. In some cases, the cause may not be known. What increases the risk? This condition is more likely to develop in: People who do not get enough iodine in their diet. People who have a family history of goiter. Women. People who are older than age 76. People who smoke tobacco. People who have had exposure to radiation. What are the signs or symptoms? The main symptom of this condition is swelling in the lower, front part of the neck. This swelling can range from a very small bump to a large lump. Other symptoms may include: A tight feeling in the throat. A hoarse  voice. Coughing. Wheezing. Difficulty swallowing or breathing. Bulging veins in the neck. Dizziness. When a goiter is the result of an overactive thyroid (hyperthyroidism), symptoms may also include: Nervousness or restlessness. Inability to tolerate heat. Unexplained weight loss. Diarrhea. Change in the texture of hair or skin. Changes in heartbeat, such as skipped beats, extra beats, or a rapid heart rate. Loss of menstruation. Shaky hands. Increased appetite. Sleep problems. When a goiter is the result of an underactive thyroid (hypothyroidism), symptoms may also include: Feeling like you have no energy (lethargy). Inability to tolerate cold. Weight gain that is not explained by a change in diet or exercise habits. Dry skin. Coarse hair. Irregular menstrual periods. Constipation. Sadness or depression. Fatigue. In some cases, there may not be any symptoms and the thyroid hormone levels may be normal. How is this diagnosed? This condition may be diagnosed based on your symptoms, your medical history, and a physical exam. You may have tests, such as: Blood tests to check thyroid function. Imaging tests, such as: Ultrasound. CT scan. MRI. Thyroid scan. Removal of a tissue sample (biopsy) of the goiter or any nodules. The sample will be tested to check for cancer. How is this treated? Treatment for this condition depends on the cause and your symptoms. Treatment may include: Medicines to regulate thyroid hormone levels. Anti-inflammatory medicines or steroid medicines, if the goiter is caused by inflammation. Iodine supplements or changes to your diet, if the goiter is caused by iodine deficiency. Radioactive iodine treatment. Surgery to remove your thyroid. In some cases, you may only need regular check-ups with your health care provider to monitor your condition, and you may not need treatment. Follow  these instructions at home: Follow instructions from your health care  provider about any changes to your diet. Take over-the-counter and prescription medicines only as told by your health care provider. These include supplements. Do not use any products that contain nicotine or tobacco, such as cigarettes and e-cigarettes. If you need help quitting, ask your health care provider. Keep all follow-up visits as told by your health care provider. This is important. Contact a health care provider if: Your symptoms do not get better with treatment. You have nausea, vomiting, or diarrhea. Get help right away if: You have sudden, unexplained confusion or other mental changes. You have a fever. You have chest pain. You have trouble breathing or swallowing. You suddenly become very weak. You experience extreme restlessness. You feel your heart racing. Summary A goiter is an enlarged thyroid gland. The thyroid gland is located in the lower front of the neck. It makes hormones that affect many body parts and systems, including the system that affects how quickly the body burns fuel for energy (metabolism). The main symptom of this condition is swelling in the lower, front part of the neck. This swelling can range from a very small bump to a large lump. Treatment for this condition depends on the cause and your symptoms. You may need medicines, supplements, or regular monitoring of your condition. This information is not intended to replace advice given to you by your health care provider. Make sure you discuss any questions you have with your health care provider. Document Revised: 02/13/2020 Document Reviewed: 02/13/2020 Elsevier Patient Education  [place].

## 2021-07-25 DIAGNOSIS — Z85850 Personal history of malignant neoplasm of thyroid: Secondary | ICD-10-CM | POA: Insufficient documentation

## 2021-07-25 DIAGNOSIS — E079 Disorder of thyroid, unspecified: Secondary | ICD-10-CM | POA: Insufficient documentation

## 2021-07-25 NOTE — Assessment & Plan Note (Signed)
Stable on current meds no changes 

## 2021-07-25 NOTE — Assessment & Plan Note (Signed)
Tolerating statin, encouraged heart healthy diet, avoid trans fats, minimize simple carbs and saturated fats. Increase exercise as tolerated 

## 2021-07-25 NOTE — Assessment & Plan Note (Addendum)
hgba1c mildly elevated, minimize simple carbs. Increase exercise as tolerated. Continue current meds, she prefers to not change medications after discussion regarding upcoming thyroid surgery. She will minimize simple carbs and consider heart healthy diet such as the MIDN diet

## 2021-07-25 NOTE — Progress Notes (Signed)
Subjective:    Patient ID: Kristina Watts, female    DOB: 1971-05-10, 51 y.o.   MRN: [id number]  Chief Complaint  Patient presents with   4 months follow up    HPI Patient is in today for follow up on chronic medical concerns. No recent febrile illness or hospitalizations. She is awaiting surgery on her thyroid mass which continues to enlarge and is becoming symptomatic. It was found by her dentist in December and was only on the left when found and has now passed the midline. No other acute complaints except for anxiety regarding the time it is taking to get her surgery done. Denies CP/palp/SOB/HA/congestion/fevers/GI or GU c/o. Taking meds as prescribed   Past Medical History:  Diagnosis Date   Acute bronchitis 11/19/2012   ADD (attention deficit disorder) 04/27/2012   Allergy    seasonal   Asthma    Back pain 11/07/2013   Cervical cancer screening 12/23/2014   Chicken pox as a child   Depression    Depression with anxiety 04/18/2011   Dermatitis 05/18/2011   Fatigue 05/30/2011   GDM (gestational diabetes mellitus) 04/19/2011   History of gestational diabetes mellitus (GDM) 04/19/2011   History of pancreatitis 04/19/2011   Hyperlipidemia 04/27/2012   LUQ pain 12/12/2011   Migraine 08/23/2011   Otitis media of right ear 05/30/2011   Overweight(278.02) 04/19/2011   Pancreatitis, recurrent 12/12/2011   Pharyngitis 08/23/2011   Pneumonia 1998   Preventative health care 04/19/2011   Recurrent cold sores    Recurrent HSV (herpes simplex virus) 04/18/2011   Reflux 12/12/2011   Tinea corporis 05/18/2011    Past Surgical History:  Procedure Laterality Date   APPENDECTOMY  2007   CESAREAN SECTION  1-02 and 4-04   CHOLECYSTECTOMY  2000    Family History  Problem Relation Age of Onset   Thyroid disease Mother    Hypertension Father    Hyperlipidemia Father    Heart disease Father    Diabetes Father    COPD Father        previous smoker   Stroke Father    Depression Father     Restless legs syndrome Father    Diabetes Brother    Cancer Maternal Grandmother        Head and Neck   Heart disease Paternal Grandmother    Hyperlipidemia Paternal Grandmother    Hypertension Paternal Grandmother    Diabetes Paternal Grandmother    Depression Paternal Grandmother    Stroke Paternal Grandmother    Restless legs syndrome Paternal Grandmother    Heart disease Paternal Grandfather    Hyperlipidemia Paternal Grandfather    Hypertension Paternal Grandfather    Stroke Paternal Grandfather    Diabetes Paternal Grandfather     Social History   Socioeconomic History   Marital status: Married    Spouse name: Not on file   Number of children: Not on file   Years of education: Not on file   Highest education level: Not on file  Occupational History   Not on file  Tobacco Use   Smoking status: Never   Smokeless tobacco: Never  Substance and Sexual Activity   Alcohol use: Yes    Comment: rarely   Drug use: No   Sexual activity: Yes    Partners: Male  Other Topics Concern   Not on file  Social History Narrative   Not on file   Social Determinants of Health   Financial Resource Strain: Not on file  Food Insecurity: Not on file  Transportation Needs: Not on file  Physical Activity: Not on file  Stress: Not on file  Social Connections: Not on file  Intimate Partner Violence: Not on file    Outpatient Medications Prior to Visit  Medication Sig Dispense Refill   Acetaminophen (TYLENOL EXTRA STRENGTH PO) Take 2 tablets by mouth as needed.     amphetamine-dextroamphetamine (ADDERALL) 10 MG tablet Take 1 tablet (10 mg total) by mouth daily. February 2021 30 tablet 0   atorvastatin (LIPITOR) 10 MG tablet TAKE 1 TABLET BY MOUTH EVERY DAY 90 tablet 1   Azelastine-Fluticasone 137-50 MCG/ACT SUSP Place into the nose.     Blood Glucose Monitoring Suppl (ONETOUCH VERIO REFLECT) w/Device KIT USE TO CHECK SUGAR ONCE A DAY AND AS NEEDED. DX CODE: E11.9 1 kit 0   buPROPion  (WELLBUTRIN XL) 150 MG 24 hr tablet TAKE 1 TABLET BY MOUTH EVERY DAY 90 tablet 0   busPIRone (BUSPAR) 5 MG tablet Take 1 tablet (5 mg total) by mouth 2 (two) times daily as needed. 40 tablet 1   Butalbital-Acetaminophen (BUTALBITAL-APAP) 50-325 MG TABS Take 1 tablet by mouth 3 (three) times daily as needed. 60 each 0   famotidine (PEPCID) 40 MG tablet TAKE 1 TABLET BY MOUTH EVERY DAY 90 tablet 1   ibuprofen (ADVIL,MOTRIN) 200 MG tablet Take 200 mg by mouth every 6 (six) hours as needed.     ketoconazole (NIZORAL) 2 % cream Apply 1 application topically daily. 30 g 1   Lancets (ONETOUCH DELICA PLUS [id number]) MISC Use to check sugar once a day and as needed.  Dx code: E11.9 100 each 1   levocetirizine (XYZAL) 5 MG tablet Take 5 mg by mouth every evening.     metFORMIN (GLUCOPHAGE) 1000 MG tablet TAKE 1 TABLET (1,000 MG TOTAL) BY MOUTH 2 (TWO) TIMES DAILY WITH A MEAL. 180 tablet 1   methylPREDNISolone (MEDROL) 4 MG tablet 5 tabs po x 1 day then 4 tabs po x 1 day then 3 tabs po x 1 day then 2 tabs po x 1 day then 1 tab po x 1 day and stop 15 tablet 0   montelukast (SINGULAIR) 10 MG tablet Take 1 tablet (10 mg total) by mouth at bedtime. 90 tablet 0   Multiple Vitamin (MULTIVITAMIN) tablet Take 1 tablet by mouth daily.     nitrofurantoin, macrocrystal-monohydrate, (MACROBID) 100 MG capsule Take 1 capsule (100 mg total) by mouth 2 (two) times daily. 6 capsule 0   ONETOUCH VERIO test strip USE TO CHECK SUGAR TWICE A DAY AND AS NEEDED. DX CODE: E11.65 100 strip 12   promethazine (PHENERGAN) 25 MG tablet Take 1 tablet (25 mg total) by mouth every 8 (eight) hours as needed for nausea or vomiting. 30 tablet 1   SYMBICORT 160-4.5 MCG/ACT inhaler TAKE 2 PUFFS BY MOUTH TWICE A DAY 10.2 g 11   valACYclovir (VALTREX) 1000 MG tablet Take 1 tablet (1,000 mg total) by mouth 2 (two) times daily. 180 tablet 1   venlafaxine XR (EFFEXOR-XR) 75 MG 24 hr capsule TAKE 1 CAPSULE (75 MG TOTAL) BY MOUTH 2 (TWO) TIMES DAILY  WITH BREAKFAST AND LUNCH. 180 capsule 1   amphetamine-dextroamphetamine (ADDERALL) 10 MG tablet Take 1 tablet (10 mg total) by mouth daily with breakfast. April 2021 30 tablet 0   amphetamine-dextroamphetamine (ADDERALL) 10 MG tablet Take 1 tablet (10 mg total) by mouth daily. March 2021 30 tablet 0   metFORMIN (GLUCOPHAGE) 500 MG tablet TAKE 1  TABLET BY MOUTH EVERY DAY WITH BREAKFAST 90 tablet 1   No facility-administered medications prior to visit.    Allergies  Allergen Reactions   Actifed [Triprolidine-Pse] Hives    Review of Systems  Constitutional:  Positive for malaise/fatigue. Negative for fever.  HENT:  Negative for congestion.   Eyes:  Negative for blurred vision.  Respiratory:  Negative for shortness of breath.   Cardiovascular:  Negative for chest pain, palpitations and leg swelling.  Gastrointestinal:  Negative for abdominal pain, blood in stool and nausea.  Genitourinary:  Negative for dysuria and frequency.  Musculoskeletal:  Negative for falls.  Skin:  Negative for rash.  Neurological:  Negative for dizziness, loss of consciousness and headaches.  Endo/Heme/Allergies:  Negative for environmental allergies.  Psychiatric/Behavioral:  Negative for depression. The patient is nervous/anxious.       Objective:    Physical Exam Constitutional:      General: She is not in acute distress.    Appearance: She is well-developed.  HENT:     Head: Normocephalic and atraumatic.  Eyes:     Conjunctiva/sclera: Conjunctivae normal.  Neck:     Thyroid: No thyromegaly.     Comments: Thyromegaly noted. Has a roughly 3 x 2 cm large mass from left to right just pass midline, Non tender Cardiovascular:     Rate and Rhythm: Normal rate and regular rhythm.     Heart sounds: Normal heart sounds. No murmur heard. Pulmonary:     Effort: Pulmonary effort is normal. No respiratory distress.     Breath sounds: Normal breath sounds.  Abdominal:     General: Bowel sounds are normal.  There is no distension.     Palpations: Abdomen is soft. There is no mass.     Tenderness: There is no abdominal tenderness.  Musculoskeletal:     Cervical back: Neck supple.  Lymphadenopathy:     Cervical: No cervical adenopathy.  Skin:    General: Skin is warm and dry.  Neurological:     Mental Status: She is alert and oriented to person, place, and time.  Psychiatric:        Behavior: Behavior normal.    BP 116/70    Pulse 94    Temp 97.9 F (36.6 C)    Resp 16    Wt 225 lb (102.1 kg)    SpO2 97%    BMI 37.44 kg/m  Wt Readings from Last 3 Encounters:  07/22/21 225 lb (102.1 kg)  03/08/21 228 lb 3.2 oz (103.5 kg)  02/13/20 228 lb 12.8 oz (103.8 kg)    Diabetic Foot Exam - Simple   No data filed    Lab Results  Component Value Date   WBC 8.2 06/11/2021   HGB 12.8 06/11/2021   HCT 37.9 06/11/2021   PLT 299.0 06/11/2021   GLUCOSE 179 (H) 06/11/2021   CHOL 197 06/11/2021   TRIG 219.0 (H) 06/11/2021   HDL 42.90 06/11/2021   LDLDIRECT 146.0 06/11/2021   LDLCALC 60 05/11/2020   ALT 111 (H) 06/11/2021   AST 95 (H) 06/11/2021   NA 137 06/11/2021   K 4.8 06/11/2021   CL 101 06/11/2021   CREATININE 0.69 06/11/2021   BUN 12 06/11/2021   CO2 28 06/11/2021   TSH 2.44 06/11/2021   HGBA1C 8.1 (H) 06/11/2021    Lab Results  Component Value Date   TSH 2.44 06/11/2021   Lab Results  Component Value Date   WBC 8.2 06/11/2021   HGB 12.8 06/11/2021  HCT 37.9 06/11/2021   MCV 86.7 06/11/2021   PLT 299.0 06/11/2021   Lab Results  Component Value Date   NA 137 06/11/2021   K 4.8 06/11/2021   CO2 28 06/11/2021   GLUCOSE 179 (H) 06/11/2021   BUN 12 06/11/2021   CREATININE 0.69 06/11/2021   BILITOT 0.6 06/11/2021   ALKPHOS 76 06/11/2021   AST 95 (H) 06/11/2021   ALT 111 (H) 06/11/2021   PROT 7.0 06/11/2021   ALBUMIN 4.4 06/11/2021   CALCIUM 9.1 06/11/2021   GFR 102.76 06/11/2021   Lab Results  Component Value Date   CHOL 197 06/11/2021   Lab Results   Component Value Date   HDL 42.90 06/11/2021   Lab Results  Component Value Date   LDLCALC 60 05/11/2020   Lab Results  Component Value Date   TRIG 219.0 (H) 06/11/2021   Lab Results  Component Value Date   CHOLHDL 5 06/11/2021   Lab Results  Component Value Date   HGBA1C 8.1 (H) 06/11/2021       Assessment & Plan:   Problem List Items Addressed This Visit     Hyperlipidemia    Tolerating statin, encouraged heart healthy diet, avoid trans fats, minimize simple carbs and saturated fats. Increase exercise as tolerated      Relevant Orders   CBC with Differential/Platelet   Comprehensive metabolic panel   Lipid panel   TSH   ADD (attention deficit disorder)    Stable on current meds no changes      Diabetes mellitus without complication (HCC)    TVNR0C mildly elevated, minimize simple carbs. Increase exercise as tolerated. Continue current meds, she prefers to not change medications after discussion regarding upcoming thyroid surgery. She will minimize simple carbs and consider heart healthy diet such as the MIDN diet      Relevant Orders   Hemoglobin A1c   CBC with Differential/Platelet   Comprehensive metabolic panel   Lipid panel   TSH   Thyroid mass    She is following with general surgery and is awaiting surgical resection. Has develop some tickle cough and is frustrated regarding the time it is taking to get the surgery scheduled. They chose to skip biopsy since it is so bit and has to be removed anyway.      Other Visit Diagnoses     Colon cancer screening    -  Primary   Relevant Orders   Ambulatory referral to Gastroenterology   MM 3D SCREEN BREAST BILATERAL   Hyperglycemia       Relevant Orders   Hemoglobin A1c   Need for influenza vaccination       Relevant Orders   Flu Vaccine QUAD 44moIM (Fluarix, Fluzone & Alfiuria Quad PF) (Completed)       I am having Kristina A. Grahn "Ann" maintain her multivitamin, Acetaminophen (TYLENOL EXTRA  STRENGTH PO), ibuprofen, montelukast, levocetirizine, Azelastine-Fluticasone, Symbicort, promethazine, Butalbital-APAP, amphetamine-dextroamphetamine, ketoconazole, OneTouch Delica Plus [id number] OneTouch Verio Reflect, OneTouch Verio, busPIRone, nitrofurantoin (macrocrystal-monohydrate), famotidine, methylPREDNISolone, valACYclovir, venlafaxine XR, buPROPion, metFORMIN, and atorvastatin.  No orders of the defined types were placed in this encounter.    SPenni Homans MD

## 2021-07-25 NOTE — Assessment & Plan Note (Signed)
She is following with general surgery and is awaiting surgical resection. Has develop some tickle cough and is frustrated regarding the time it is taking to get the surgery scheduled. They chose to skip biopsy since it is so bit and has to be removed anyway.

## 2021-07-27 ENCOUNTER — Ambulatory Visit
Admission: RE | Admit: 2021-07-27 | Discharge: 2021-07-27 | Disposition: A | Discharge status: Home / Self Care | Payer: Managed Care, Other (non HMO) | Source: Ambulatory Visit | Attending: General Surgery | Admitting: General Surgery

## 2021-07-27 DIAGNOSIS — E041 Nontoxic single thyroid nodule: Secondary | ICD-10-CM

## 2021-07-27 MED ORDER — IOPAMIDOL (ISOVUE-300) INJECTION 61%
75.0000 mL | Freq: Once | INTRAVENOUS | Status: AC | PRN
  Administered 2021-07-27: 75 mL via INTRAVENOUS

## 2021-07-29 ENCOUNTER — Other Ambulatory Visit: Payer: Self-pay | Admitting: Family Medicine

## 2021-07-29 MED ORDER — BUPROPION HCL ER (XL) 150 MG PO TB24: 150.0000 mg | ORAL_TABLET | Freq: Every day | ORAL | 1 refills | Status: DC

## 2021-08-02 ENCOUNTER — Ambulatory Visit: Payer: Self-pay | Admitting: General Surgery

## 2021-08-05 ENCOUNTER — Encounter (HOSPITAL_COMMUNITY): Payer: Self-pay

## 2021-08-05 NOTE — Patient Instructions (Addendum)
DUE TO COVID-19 ONLY ONE VISITOR IS ALLOWED TO COME WITH YOU AND STAY IN THE WAITING ROOM ONLY DURING PRE OP AND PROCEDURE DAY OF SURGERY IF YOU ARE GOING HOME AFTER SURGERY. IF YOU ARE SPENDING THE NIGHT 2 PEOPLE MAY VISIT WITH YOU IN YOUR PRIVATE ROOM AFTER SURGERY UNTIL VISITING  HOURS ARE OVER AT 800 PM AND 1  VISITOR  MAY  SPEND THE NIGHT.   YOU NEED TO HAVE A COVID 19 TEST ON_3/8______ @__8 :00_____, THIS TEST MUST BE DONE BEFORE SURGERY,                 Kristina Watts     Your procedure is scheduled on: 08/27/21   Report to [facility] Main  Entrance   Report to short stay at 5:15 AM     Call this number if you have problems the morning of surgery [phone]  .  No food after midnight.    You may have clear liquid until 4:30 AM.    At 4:15 AM drink pre surgery drink.   Nothing by mouth after 4:30 AM.   CLEAR LIQUID DIET   Foods Allowed                                                                     Foods Excluded  Coffee and tea, regular and decaf                             liquids that you cannot  Plain Jell-O any favor except red or purple                                           see through such as: Fruit ices (not with fruit pulp)                                     milk, soups, orange juice  Iced Popsicles                                    All solid food Carbonated beverages, regular and diet                                    Cranberry, grape and apple juices Sports drinks like Gatorade Lightly seasoned clear broth or consume(fat free) Sugar     BRUSH YOUR TEETH MORNING OF SURGERY AND RINSE YOUR MOUTH OUT, NO CHEWING GUM CANDY OR MINTS.     Take these medicines the morning of surgery with A SIP OF WATER: Buspirone, Bupropion, Venlafaxine. Use you inhaler and bring it with you  DO NOT TAKE ANY DIABETIC MEDICATIONS DAY OF YOUR SURGERY How to Manage Your Diabetes Before and After Surgery  Why is it important to control my blood sugar  before and after surgery? Improving blood sugar levels before and after surgery helps healing  and can limit problems. A way of improving blood sugar control is eating a healthy diet by:  Eating less sugar and carbohydrates  Increasing activity/exercise  Talking with your doctor about reaching your blood sugar goals High blood sugars (greater than 180 mg/dL) can raise your risk of infections and slow your recovery, so you will need to focus on controlling your diabetes during the weeks before surgery. Make sure that the doctor who takes care of your diabetes knows about your planned surgery including the date and location.  How do I manage my blood sugar before surgery? Check your blood sugar at least 4 times a day, starting 2 days before surgery, to make sure that the level is not too high or low. Check your blood sugar the morning of your surgery when you wake up and every 2 hours until you get to the Short Stay unit. If your blood sugar is less than 70 mg/dL, you will need to treat for low blood sugar: Do not take insulin. Treat a low blood sugar (less than 70 mg/dL) with  cup of clear juice (cranberry or apple), 4 glucose tablets, OR glucose gel. Recheck blood sugar in 15 minutes after treatment (to make sure it is greater than 70 mg/dL). If your blood sugar is not greater than 70 mg/dL on recheck, call [phone] for further instructions. Report your blood sugar to the short stay nurse when you get to Short Stay.  If you are admitted to the hospital after surgery: Your blood sugar will be checked by the staff and you will probably be given insulin after surgery (instead of oral diabetes medicines) to make sure you have good blood sugar levels. The goal for blood sugar control after surgery is 80-180 mg/dL.   WHAT DO I DO ABOUT MY DIABETES MEDICATION?  Do not take oral diabetes medicines (pills) the morning of surgery.                                You may not have any metal on  your body including hair pins and              piercings  Do not wear jewelry, make-up, lotions, powders or perfumes, deodorant             Do not wear nail polish on your fingernails.  Do not shave  48 hours prior to surgery.                Do not bring valuables to the hospital. [place].  Contacts, dentures or bridgework may not be worn into surgery.  Leave suitcase in the car. After surgery it may be brought to your room.   _____________________________________________________________________             [facility] - Preparing for Surgery Before surgery, you can play an important role.  Because skin is not sterile, your skin needs to be as free of germs as possible.  You can reduce the number of germs on your skin by washing with CHG (chlorahexidine gluconate) soap before surgery.  CHG is an antiseptic cleaner which kills germs and bonds with the skin to continue killing germs even after washing. Please DO NOT use if you have an allergy to CHG or antibacterial soaps.  If your skin becomes reddened/irritated stop using  the CHG and inform your nurse when you arrive at Short Stay. Do not shave (including legs and underarms) for at least 48 hours prior to the first CHG shower.   Please follow these instructions carefully:  1.  Shower with CHG Soap the night before surgery and the  morning of Surgery.  2.  If you choose to wash your hair, wash your hair first as usual with your  normal  shampoo.  3.  After you shampoo, rinse your hair and body thoroughly to remove the  shampoo.                            4.  Use CHG as you would any other liquid soap.  You can apply chg directly  to the skin and wash                       Gently with a scrungie or clean washcloth.  5.  Apply the CHG Soap to your body ONLY FROM THE NECK DOWN.   Do not use on face/ open                           Wound or open sores. Avoid contact with eyes, ears mouth and  genitals (private parts).                       Wash face,  Genitals (private parts) with your normal soap.             6.  Wash thoroughly, paying special attention to the area where your surgery  will be performed.  7.  Thoroughly rinse your body with warm water from the neck down.  8.  DO NOT shower/wash with your normal soap after using and rinsing off  the CHG Soap.                9.  Pat yourself dry with a clean towel.            10.  Wear clean pajamas.            11.  Place clean sheets on your bed the night of your first shower and do not  sleep with pets. Day of Surgery : Do not apply any lotions/deodorants the morning of surgery.  Please wear clean clothes to the hospital/surgery center.  FAILURE TO FOLLOW THESE INSTRUCTIONS MAY RESULT IN THE CANCELLATION OF YOUR SURGERY PATIENT SIGNATURE_________________________________  NURSE SIGNATURE__________________________________  ________________________________________________________________________

## 2021-08-06 ENCOUNTER — Other Ambulatory Visit: Payer: Self-pay | Admitting: Family Medicine

## 2021-08-07 ENCOUNTER — Other Ambulatory Visit: Payer: Self-pay | Admitting: Family Medicine

## 2021-08-09 ENCOUNTER — Ambulatory Visit (HOSPITAL_COMMUNITY)
Admission: RE | Admit: 2021-08-09 | Discharge: 2021-08-09 | Disposition: A | Discharge status: Home / Self Care | Payer: Managed Care, Other (non HMO) | Source: Ambulatory Visit | Attending: Anesthesiology | Admitting: Anesthesiology

## 2021-08-09 ENCOUNTER — Encounter (HOSPITAL_BASED_OUTPATIENT_CLINIC_OR_DEPARTMENT_OTHER): Payer: Self-pay

## 2021-08-09 ENCOUNTER — Encounter (HOSPITAL_COMMUNITY): Payer: Self-pay

## 2021-08-09 ENCOUNTER — Ambulatory Visit (HOSPITAL_BASED_OUTPATIENT_CLINIC_OR_DEPARTMENT_OTHER)
Admission: RE | Admit: 2021-08-09 | Discharge: 2021-08-09 | Disposition: A | Discharge status: Home / Self Care | Payer: Managed Care, Other (non HMO) | Source: Ambulatory Visit | Attending: Family Medicine | Admitting: Family Medicine

## 2021-08-09 ENCOUNTER — Other Ambulatory Visit: Payer: Self-pay

## 2021-08-09 ENCOUNTER — Encounter (HOSPITAL_COMMUNITY)
Admission: RE | Admit: 2021-08-09 | Discharge: 2021-08-09 | Disposition: A | Discharge status: Home / Self Care | Payer: Managed Care, Other (non HMO) | Source: Ambulatory Visit | Attending: General Surgery | Admitting: General Surgery

## 2021-08-09 VITALS — BP 125/86 | HR 107 | Temp 98.7°F | Resp 18 | Ht 65.0 in | Wt 226.0 lb

## 2021-08-09 DIAGNOSIS — Z01818 Encounter for other preprocedural examination: Secondary | ICD-10-CM | POA: Insufficient documentation

## 2021-08-09 DIAGNOSIS — Z1211 Encounter for screening for malignant neoplasm of colon: Secondary | ICD-10-CM | POA: Insufficient documentation

## 2021-08-09 DIAGNOSIS — E119 Type 2 diabetes mellitus without complications: Secondary | ICD-10-CM

## 2021-08-09 HISTORY — DX: Type 2 diabetes mellitus without complications: E11.9

## 2021-08-09 LAB — CBC
HCT: 40.4 % (ref 36.0–46.0)
Hemoglobin: 13.5 g/dL (ref 12.0–15.0)
MCH: 29.5 pg (ref 26.0–34.0)
MCHC: 33.4 g/dL (ref 30.0–36.0)
MCV: 88.4 fL (ref 80.0–100.0)
Platelets: 294 10*3/uL (ref 150–400)
RBC: 4.57 MIL/uL (ref 3.87–5.11)
RDW: 13.4 % (ref 11.5–15.5)
WBC: 11.5 10*3/uL — ABNORMAL HIGH (ref 4.0–10.5)
nRBC: 0 % (ref 0.0–0.2)

## 2021-08-09 LAB — BASIC METABOLIC PANEL
Anion gap: 10 (ref 5–15)
BUN: 11 mg/dL (ref 6–20)
CO2: 23 mmol/L (ref 22–32)
Calcium: 8.8 mg/dL — ABNORMAL LOW (ref 8.9–10.3)
Chloride: 102 mmol/L (ref 98–111)
Creatinine, Ser: 0.65 mg/dL (ref 0.44–1.00)
GFR, Estimated: >60 mL/min (ref >60)
Glucose, Bld: 247 mg/dL — ABNORMAL HIGH (ref 70–99)
Potassium: 3.9 mmol/L (ref 3.5–5.1)
Sodium: 135 mmol/L (ref 135–145)

## 2021-08-09 LAB — HEMOGLOBIN A1C
Hgb A1c MFr Bld: 8.4 % — ABNORMAL HIGH (ref 4.8–5.6)
Mean Plasma Glucose: 194 mg/dL

## 2021-08-09 LAB — GLUCOSE, CAPILLARY: Glucose-Capillary: 226 mg/dL — ABNORMAL HIGH (ref 70–99)

## 2021-08-09 NOTE — Progress Notes (Signed)
COVID test- 08/25/21 at 8:00 am  Bowel prep reminder:NA  PCP - Dr. Frederik Pear Cardiologist - none  Chest x-ray - 08/09/21-epic EKG - 08/09/21-chart Stress Test - no ECHO - no Cardiac Cath - no Pacemaker/ICD device last checked:NA  Sleep Study - no CPAP -   Fasting Blood Sugar - 130-160 Checks Blood Sugar _____ times a day. Once a week  Blood Thinner Instructions:NA Aspirin Instructions: Last Dose:  Anesthesia review: yes  Patient denies shortness of breath, fever, cough and chest pain at PAT appointment Pt reports no SOB with activities but does need an inhaler in the spring when pollen is out.  She has a sore throat today and her period started so she feels bad. I told her if she gets more sick or symptoms change to call Dr. Kieth Brightly. Her CBG was 226 at the PAT visit. I told her to watch her diet , test more often and call her Dr. If her sugar is trending high. Patient verbalized understanding of instructions that were given to them at the PAT appointment. Patient was also instructed that they will need to review over the PAT instructions again at home before surgery. yes

## 2021-08-10 ENCOUNTER — Encounter: Payer: Self-pay | Admitting: Family Medicine

## 2021-08-11 ENCOUNTER — Ambulatory Visit (INDEPENDENT_AMBULATORY_CARE_PROVIDER_SITE_OTHER): Payer: Managed Care, Other (non HMO) | Admitting: Family Medicine

## 2021-08-11 ENCOUNTER — Encounter: Payer: Self-pay | Admitting: Family Medicine

## 2021-08-11 VITALS — BP 129/68 | HR 88 | Resp 20 | Ht 65.0 in | Wt 225.6 lb

## 2021-08-11 DIAGNOSIS — J029 Acute pharyngitis, unspecified: Secondary | ICD-10-CM | POA: Diagnosis not present

## 2021-08-11 LAB — POCT INFLUENZA A/B
Influenza A, POC: NEGATIVE
Influenza B, POC: NEGATIVE

## 2021-08-11 MED ORDER — AMOXICILLIN 500 MG PO CAPS: 500.0000 mg | ORAL_CAPSULE | Freq: Two times a day (BID) | ORAL | 0 refills | Status: AC

## 2021-08-11 NOTE — Patient Instructions (Addendum)
Flu negative Strep test sent out for culture (no rapid tests available) Based on your symptoms and upcoming surgery, I am going to treat you empirically for strep with amoxicillin.  Continue supportive measures and follow-up if not improving.

## 2021-08-11 NOTE — Progress Notes (Signed)
Acute Office Visit  Subjective:    Patient ID: Kristina Watts, female    DOB: 1970/07/27, 51 y.o.   MRN: [id number]  Chief Complaint  Patient presents with   Sore Throat    Sore throat started Saturday, taking Advil  Runny nose, allergies? Lymph nodes right side behind ear, painful, having thyroid removed in 20 days Took covid test two days ago, negative    HPI Patient is in today for sore throat.  Patient reports she has had a sore throat for the past 4 days. Initially thought it was reflux, but then started feeling worse, general malaise, cold sweats, cervical adenopathy, occasional mild headaches, rhinorrhea. Pain is about 5/10 sore throat and down to 3/10 with ibuprofen and tylenol on board. She had a negative COVID test 2 days ago. She denies fever, chills, body aches, cough, nausea, vomiting, diarrhea, loss of taste/smell. She works at an [employer] so possible sick contacts.     Past Medical History:  Diagnosis Date   Acute bronchitis 11/19/2012   ADD (attention deficit disorder) 04/27/2012   Allergy    seasonal   Asthma    Back pain 11/07/2013   Cervical cancer screening 12/23/2014   Chicken pox as a child   Depression with anxiety 04/18/2011   Dermatitis 05/18/2011   Diabetes mellitus without complication ([place])    History of gestational diabetes mellitus (GDM) 04/19/2011   History of pancreatitis 04/19/2011   Hyperlipidemia 04/27/2012   LUQ pain 12/12/2011   Migraine 08/23/2011   Otitis media of right ear 05/30/2011   Overweight(278.02) 04/19/2011   Pancreatitis, recurrent 12/12/2011   Pharyngitis 08/23/2011   Pneumonia 1998   Preventative health care 04/19/2011   Recurrent HSV (herpes simplex virus) 04/18/2011   Reflux 12/12/2011   Tinea corporis 05/18/2011    Past Surgical History:  Procedure Laterality Date   APPENDECTOMY  2007   CESAREAN SECTION  1-02 and 4-04   CHOLECYSTECTOMY  2000    Family History  Problem Relation Age of Onset    Thyroid disease Mother    Hypertension Father    Hyperlipidemia Father    Heart disease Father    Diabetes Father    COPD Father        previous smoker   Stroke Father    Depression Father    Restless legs syndrome Father    Diabetes Brother    Cancer Maternal Grandmother        Head and Neck   Heart disease Paternal Grandmother    Hyperlipidemia Paternal Grandmother    Hypertension Paternal Grandmother    Diabetes Paternal Grandmother    Depression Paternal Grandmother    Stroke Paternal Grandmother    Restless legs syndrome Paternal Grandmother    Heart disease Paternal Grandfather    Hyperlipidemia Paternal Grandfather    Hypertension Paternal Grandfather    Stroke Paternal Grandfather    Diabetes Paternal Grandfather     Social History   Socioeconomic History   Marital status: Married    Spouse name: Not on file   Number of children: Not on file   Years of education: Not on file   Highest education level: Not on file  Occupational History   Not on file  Tobacco Use   Smoking status: Never   Smokeless tobacco: Never  Vaping Use   Vaping Use: Never used  Substance and Sexual Activity   Alcohol use: Yes    Comment: 1/week   Drug use: No   Sexual  activity: Yes    Partners: Male  Other Topics Concern   Not on file  Social History Narrative   Not on file   Social Determinants of Health   Financial Resource Strain: Not on file  Food Insecurity: Not on file  Transportation Needs: Not on file  Physical Activity: Not on file  Stress: Not on file  Social Connections: Not on file  Intimate Partner Violence: Not on file    Outpatient Medications Prior to Visit  Medication Sig Dispense Refill   acetaminophen (TYLENOL) 500 MG tablet Take 1,000 mg by mouth every 6 (six) hours as needed for moderate pain.     amphetamine-dextroamphetamine (ADDERALL) 10 MG tablet Take 1 tablet (10 mg total) by mouth daily. February 2021 (Patient taking differently: Take 10 mg by  mouth daily as needed (focus).) 30 tablet 0   atorvastatin (LIPITOR) 10 MG tablet TAKE 1 TABLET BY MOUTH EVERY DAY 90 tablet 1   Blood Glucose Monitoring Suppl (ONETOUCH VERIO REFLECT) w/Device KIT USE TO CHECK SUGAR ONCE A DAY AND AS NEEDED. DX CODE: E11.9 1 kit 0   buPROPion (WELLBUTRIN XL) 150 MG 24 hr tablet TAKE 1 TABLET BY MOUTH EVERY DAY 90 tablet 1   busPIRone (BUSPAR) 5 MG tablet Take 1 tablet (5 mg total) by mouth 2 (two) times daily as needed. 40 tablet 1   Butalbital-Acetaminophen (BUTALBITAL-APAP) 50-325 MG TABS Take 1 tablet by mouth 3 (three) times daily as needed. 60 each 0   famotidine (PEPCID) 40 MG tablet TAKE 1 TABLET BY MOUTH EVERY DAY 90 tablet 1   ibuprofen (ADVIL,MOTRIN) 200 MG tablet Take 400 mg by mouth every 6 (six) hours as needed for headache or moderate pain.     ketoconazole (NIZORAL) 2 % cream Apply 1 application topically daily. (Patient taking differently: Apply 1 application topically daily as needed for irritation.) 30 g 1   Lancets (ONETOUCH DELICA PLUS [id number]) MISC Use to check sugar once a day and as needed.  Dx code: E11.9 100 each 1   levocetirizine (XYZAL) 5 MG tablet Take 5 mg by mouth every evening.     melatonin 3 MG TABS tablet Take 3 mg by mouth at bedtime.     metFORMIN (GLUCOPHAGE) 1000 MG tablet TAKE 1 TABLET (1,000 MG TOTAL) BY MOUTH 2 (TWO) TIMES DAILY WITH A MEAL. 180 tablet 1   methylPREDNISolone (MEDROL) 4 MG tablet 5 tabs po x 1 day then 4 tabs po x 1 day then 3 tabs po x 1 day then 2 tabs po x 1 day then 1 tab po x 1 day and stop 15 tablet 0   montelukast (SINGULAIR) 10 MG tablet Take 1 tablet (10 mg total) by mouth at bedtime. (Patient taking differently: Take 10 mg by mouth at bedtime as needed (allergies in spring).) 90 tablet 0   Multiple Vitamin (MULTIVITAMIN) tablet Take 1 tablet by mouth daily.     nitrofurantoin, macrocrystal-monohydrate, (MACROBID) 100 MG capsule Take 1 capsule (100 mg total) by mouth 2 (two) times daily. 6 capsule  0   ONETOUCH VERIO test strip USE TO CHECK SUGAR TWICE A DAY AND AS NEEDED. DX CODE: E11.65 100 strip 12   promethazine (PHENERGAN) 25 MG tablet Take 1 tablet (25 mg total) by mouth every 8 (eight) hours as needed for nausea or vomiting. 30 tablet 1   SYMBICORT 160-4.5 MCG/ACT inhaler TAKE 2 PUFFS BY MOUTH TWICE A DAY (Patient taking differently: Inhale 2 puffs into the lungs 2 (two) times  daily as needed (shortness of breath or wheezing in the spring).) 10.2 g 11   valACYclovir (VALTREX) 1000 MG tablet Take 1 tablet (1,000 mg total) by mouth 2 (two) times daily. (Patient taking differently: Take 1,000 mg by mouth 2 (two) times daily as needed (outbreaks).) 180 tablet 1   venlafaxine XR (EFFEXOR-XR) 75 MG 24 hr capsule TAKE 1 CAPSULE (75 MG TOTAL) BY MOUTH 2 (TWO) TIMES DAILY WITH BREAKFAST AND LUNCH. 180 capsule 1   No facility-administered medications prior to visit.    Allergies  Allergen Reactions   Actifed [Triprolidine-Pse] Hives    Review of Systems All review of systems negative except what is listed in the HPI     Objective:    Physical Exam Vitals reviewed.  Constitutional:      Appearance: She is well-developed. She is obese.  HENT:     Head: Normocephalic and atraumatic.     Right Ear: Tympanic membrane normal.     Left Ear: Tympanic membrane normal.     Mouth/Throat:     Pharynx: Posterior oropharyngeal erythema present. No oropharyngeal exudate.  Eyes:     Conjunctiva/sclera: Conjunctivae normal.     Pupils: Pupils are equal, round, and reactive to light.  Cardiovascular:     Rate and Rhythm: Normal rate and regular rhythm.  Pulmonary:     Effort: Pulmonary effort is normal.     Breath sounds: Normal breath sounds.  Musculoskeletal:     Cervical back: Normal range of motion and neck supple.  Lymphadenopathy:     Cervical: Cervical adenopathy present.  Skin:    General: Skin is warm and dry.  Neurological:     General: No focal deficit present.     Mental  Status: She is alert and oriented to person, place, and time.  Psychiatric:        Mood and Affect: Mood normal.        Behavior: Behavior normal.    BP 129/68 (BP Location: Left Arm, Patient Position: Sitting, Cuff Size: Normal)    Pulse 88    Resp 20    Ht '5\' 5"'  (1.651 m)    Wt 225 lb 9.6 oz (102.3 kg)    LMP 08/09/2021 (Exact Date)    SpO2 95%    BMI 37.54 kg/m  Wt Readings from Last 3 Encounters:  08/11/21 225 lb 9.6 oz (102.3 kg)  08/09/21 226 lb (102.5 kg)  07/22/21 225 lb (102.1 kg)    Health Maintenance Due  Topic Date Due   FOOT EXAM  Never done   OPHTHALMOLOGY EXAM  Never done   URINE MICROALBUMIN  Never done   HIV Screening  Never done   Hepatitis C Screening  Never done   PAP SMEAR-Modifier  12/22/2017   COLONOSCOPY (Pts 45-32yr Insurance coverage will need to be confirmed)  Never done    There are no preventive care reminders to display for this patient.   Lab Results  Component Value Date   TSH 2.44 06/11/2021   Lab Results  Component Value Date   WBC 11.5 (H) 08/09/2021   HGB 13.5 08/09/2021   HCT 40.4 08/09/2021   MCV 88.4 08/09/2021   PLT 294 08/09/2021   Lab Results  Component Value Date   NA 135 08/09/2021   K 3.9 08/09/2021   CO2 23 08/09/2021   GLUCOSE 247 (H) 08/09/2021   BUN 11 08/09/2021   CREATININE 0.65 08/09/2021   BILITOT 0.6 06/11/2021   ALKPHOS 76 06/11/2021  AST 95 (H) 06/11/2021   ALT 111 (H) 06/11/2021   PROT 7.0 06/11/2021   ALBUMIN 4.4 06/11/2021   CALCIUM 8.8 (L) 08/09/2021   ANIONGAP 10 08/09/2021   GFR 102.76 06/11/2021   Lab Results  Component Value Date   CHOL 197 06/11/2021   Lab Results  Component Value Date   HDL 42.90 06/11/2021   Lab Results  Component Value Date   LDLCALC 60 05/11/2020   Lab Results  Component Value Date   TRIG 219.0 (H) 06/11/2021   Lab Results  Component Value Date   CHOLHDL 5 06/11/2021   Lab Results  Component Value Date   HGBA1C 8.4 (H) 08/09/2021       Assessment  & Plan:   1. Sore throat Flu negative Strep test sent out for culture (no rapid tests available) Based on your symptoms and upcoming surgery, I am going to treat you empirically for strep with amoxicillin.  Continue supportive measures and follow-up if not improving.   - amoxicillin (AMOXIL) 500 MG capsule; Take 1 capsule (500 mg total) by mouth 2 (two) times daily for 10 days.  Dispense: 20 capsule; Refill: 0 - POCT Influenza A/B - Culture, Group A Strep   Follow-up if symptoms worsen or fail to improve.   Terrilyn Saver, NP

## 2021-08-13 LAB — CULTURE, GROUP A STREP
MICRO NUMBER:: 13042468
SPECIMEN QUALITY:: ADEQUATE

## 2021-08-25 ENCOUNTER — Encounter (HOSPITAL_COMMUNITY)
Admission: RE | Admit: 2021-08-25 | Discharge: 2021-08-25 | Disposition: A | Discharge status: Home / Self Care | Payer: Managed Care, Other (non HMO) | Source: Ambulatory Visit | Attending: General Surgery | Admitting: General Surgery

## 2021-08-25 ENCOUNTER — Other Ambulatory Visit: Payer: Self-pay

## 2021-08-25 DIAGNOSIS — Z20822 Contact with and (suspected) exposure to covid-19: Secondary | ICD-10-CM | POA: Insufficient documentation

## 2021-08-25 DIAGNOSIS — Z01812 Encounter for preprocedural laboratory examination: Secondary | ICD-10-CM | POA: Diagnosis present

## 2021-08-25 LAB — SARS CORONAVIRUS 2 (TAT 6-24 HRS): SARS Coronavirus 2: NEGATIVE

## 2021-08-27 ENCOUNTER — Other Ambulatory Visit: Payer: Self-pay

## 2021-08-27 ENCOUNTER — Encounter (HOSPITAL_COMMUNITY)
Admission: RE | Disposition: A | Discharge status: Home / Self Care | Payer: Self-pay | Source: Home / Self Care | Attending: General Surgery

## 2021-08-27 ENCOUNTER — Observation Stay (HOSPITAL_COMMUNITY)
Admission: RE | Admit: 2021-08-27 | Discharge: 2021-08-28 | Disposition: A | Discharge status: Home / Self Care | Payer: Managed Care, Other (non HMO) | Attending: General Surgery | Admitting: General Surgery

## 2021-08-27 ENCOUNTER — Ambulatory Visit (HOSPITAL_BASED_OUTPATIENT_CLINIC_OR_DEPARTMENT_OTHER): Payer: Managed Care, Other (non HMO) | Admitting: Certified Registered Nurse Anesthetist

## 2021-08-27 ENCOUNTER — Encounter (HOSPITAL_COMMUNITY): Payer: Self-pay | Admitting: General Surgery

## 2021-08-27 ENCOUNTER — Ambulatory Visit (HOSPITAL_COMMUNITY): Payer: Managed Care, Other (non HMO) | Admitting: Physician Assistant

## 2021-08-27 DIAGNOSIS — Z01818 Encounter for other preprocedural examination: Secondary | ICD-10-CM

## 2021-08-27 DIAGNOSIS — Z7984 Long term (current) use of oral hypoglycemic drugs: Secondary | ICD-10-CM | POA: Diagnosis not present

## 2021-08-27 DIAGNOSIS — E042 Nontoxic multinodular goiter: Secondary | ICD-10-CM | POA: Diagnosis present

## 2021-08-27 DIAGNOSIS — E079 Disorder of thyroid, unspecified: Secondary | ICD-10-CM | POA: Diagnosis present

## 2021-08-27 DIAGNOSIS — Z79899 Other long term (current) drug therapy: Secondary | ICD-10-CM | POA: Insufficient documentation

## 2021-08-27 DIAGNOSIS — E119 Type 2 diabetes mellitus without complications: Secondary | ICD-10-CM | POA: Diagnosis not present

## 2021-08-27 DIAGNOSIS — Z85850 Personal history of malignant neoplasm of thyroid: Secondary | ICD-10-CM | POA: Diagnosis present

## 2021-08-27 DIAGNOSIS — J45909 Unspecified asthma, uncomplicated: Secondary | ICD-10-CM | POA: Diagnosis not present

## 2021-08-27 HISTORY — PX: THYROIDECTOMY: SHX17

## 2021-08-27 LAB — GLUCOSE, CAPILLARY
Glucose-Capillary: 166 mg/dL — ABNORMAL HIGH (ref 70–99)
Glucose-Capillary: 200 mg/dL — ABNORMAL HIGH (ref 70–99)

## 2021-08-27 LAB — CREATININE, SERUM
Creatinine, Ser: 0.62 mg/dL (ref 0.44–1.00)
GFR, Estimated: >60 mL/min (ref >60)

## 2021-08-27 LAB — CBC
HCT: 35.4 % — ABNORMAL LOW (ref 36.0–46.0)
Hemoglobin: 11.9 g/dL — ABNORMAL LOW (ref 12.0–15.0)
MCH: 29.8 pg (ref 26.0–34.0)
MCHC: 33.6 g/dL (ref 30.0–36.0)
MCV: 88.5 fL (ref 80.0–100.0)
Platelets: 280 10*3/uL (ref 150–400)
RBC: 4 MIL/uL (ref 3.87–5.11)
RDW: 13.6 % (ref 11.5–15.5)
WBC: 11.7 10*3/uL — ABNORMAL HIGH (ref 4.0–10.5)
nRBC: 0 % (ref 0.0–0.2)

## 2021-08-27 LAB — PREGNANCY, URINE: Preg Test, Ur: NEGATIVE

## 2021-08-27 SURGERY — THYROIDECTOMY
Anesthesia: General | Site: Neck

## 2021-08-27 MED ORDER — SUGAMMADEX SODIUM 500 MG/5ML IV SOLN
INTRAVENOUS | Status: AC
  Filled 2021-08-27: qty 5

## 2021-08-27 MED ORDER — BUSPIRONE HCL 5 MG PO TABS
5.0000 mg | ORAL_TABLET | Freq: Two times a day (BID) | ORAL | Status: DC
  Administered 2021-08-27 – 2021-08-28 (×2): 5 mg via ORAL
  Filled 2021-08-27 (×2): qty 1

## 2021-08-27 MED ORDER — VENLAFAXINE HCL ER 75 MG PO CP24
75.0000 mg | ORAL_CAPSULE | Freq: Two times a day (BID) | ORAL | Status: DC
  Administered 2021-08-27 – 2021-08-28 (×2): 75 mg via ORAL
  Filled 2021-08-27 (×2): qty 1

## 2021-08-27 MED ORDER — FLUTICASONE FUROATE-VILANTEROL 200-25 MCG/ACT IN AEPB
1.0000 | INHALATION_SPRAY | Freq: Every day | RESPIRATORY_TRACT | Status: DC
Start: 2021-08-28 — End: 2021-08-28
  Filled 2021-08-27: qty 28

## 2021-08-27 MED ORDER — ONDANSETRON HCL 4 MG/2ML IJ SOLN
INTRAMUSCULAR | Status: AC
  Filled 2021-08-27: qty 2

## 2021-08-27 MED ORDER — FENTANYL CITRATE (PF) 100 MCG/2ML IJ SOLN
INTRAMUSCULAR | Status: AC
  Filled 2021-08-27: qty 2

## 2021-08-27 MED ORDER — DEXAMETHASONE SODIUM PHOSPHATE 10 MG/ML IJ SOLN
INTRAMUSCULAR | Status: AC
  Filled 2021-08-27: qty 1

## 2021-08-27 MED ORDER — KETOROLAC TROMETHAMINE 30 MG/ML IJ SOLN
30.0000 mg | Freq: Once | INTRAMUSCULAR | Status: AC | PRN
  Administered 2021-08-27: 30 mg via INTRAVENOUS

## 2021-08-27 MED ORDER — MELATONIN 3 MG PO TABS
3.0000 mg | ORAL_TABLET | Freq: Every day | ORAL | Status: DC
  Administered 2021-08-27: 3 mg via ORAL
  Filled 2021-08-27: qty 1

## 2021-08-27 MED ORDER — BUTALBITAL-APAP-CAFFEINE 50-325-40 MG PO TABS: 1.0000 | ORAL_TABLET | Freq: Three times a day (TID) | ORAL | Status: DC | PRN

## 2021-08-27 MED ORDER — ONDANSETRON HCL 4 MG/2ML IJ SOLN
INTRAMUSCULAR | Status: DC | PRN
  Administered 2021-08-27: 4 mg via INTRAVENOUS

## 2021-08-27 MED ORDER — ROCURONIUM BROMIDE 10 MG/ML (PF) SYRINGE
PREFILLED_SYRINGE | INTRAVENOUS | Status: AC
  Filled 2021-08-27: qty 10

## 2021-08-27 MED ORDER — DROPERIDOL 2.5 MG/ML IJ SOLN
INTRAMUSCULAR | Status: DC | PRN
Start: 2021-08-27 — End: 2021-08-27
  Administered 2021-08-27: .625 mg via INTRAVENOUS

## 2021-08-27 MED ORDER — SUGAMMADEX SODIUM 200 MG/2ML IV SOLN
INTRAVENOUS | Status: DC | PRN
  Administered 2021-08-27: 300 mg via INTRAVENOUS

## 2021-08-27 MED ORDER — FENTANYL CITRATE (PF) 100 MCG/2ML IJ SOLN
INTRAMUSCULAR | Status: DC | PRN
Start: 2021-08-27 — End: 2021-08-27
  Administered 2021-08-27 (×2): 100 ug via INTRAVENOUS
  Administered 2021-08-27 (×3): 50 ug via INTRAVENOUS

## 2021-08-27 MED ORDER — FENTANYL CITRATE (PF) 250 MCG/5ML IJ SOLN
INTRAMUSCULAR | Status: AC
  Filled 2021-08-27: qty 5

## 2021-08-27 MED ORDER — METFORMIN HCL 500 MG PO TABS
1000.0000 mg | ORAL_TABLET | Freq: Two times a day (BID) | ORAL | Status: DC
  Administered 2021-08-27 – 2021-08-28 (×2): 1000 mg via ORAL
  Filled 2021-08-27 (×2): qty 2

## 2021-08-27 MED ORDER — KETOROLAC TROMETHAMINE 30 MG/ML IJ SOLN
INTRAMUSCULAR | Status: AC
  Filled 2021-08-27: qty 1

## 2021-08-27 MED ORDER — PROPOFOL 10 MG/ML IV BOLUS
INTRAVENOUS | Status: DC | PRN
  Administered 2021-08-27: 200 mg via INTRAVENOUS

## 2021-08-27 MED ORDER — PROPOFOL 10 MG/ML IV BOLUS
INTRAVENOUS | Status: AC
  Filled 2021-08-27: qty 20

## 2021-08-27 MED ORDER — DIPHENHYDRAMINE HCL 50 MG/ML IJ SOLN: 25.0000 mg | Freq: Four times a day (QID) | INTRAMUSCULAR | Status: DC | PRN

## 2021-08-27 MED ORDER — ENOXAPARIN SODIUM 40 MG/0.4ML IJ SOSY
40.0000 mg | PREFILLED_SYRINGE | INTRAMUSCULAR | Status: DC
  Administered 2021-08-28: 40 mg via SUBCUTANEOUS
  Filled 2021-08-27: qty 0.4

## 2021-08-27 MED ORDER — LIDOCAINE HCL (PF) 2 % IJ SOLN
INTRAMUSCULAR | Status: AC
  Filled 2021-08-27: qty 5

## 2021-08-27 MED ORDER — DROPERIDOL 2.5 MG/ML IJ SOLN
INTRAMUSCULAR | Status: AC
  Filled 2021-08-27: qty 2

## 2021-08-27 MED ORDER — ORAL CARE MOUTH RINSE: 15.0000 mL | Freq: Once | OROMUCOSAL | Status: AC

## 2021-08-27 MED ORDER — BUPIVACAINE-EPINEPHRINE (PF) 0.25% -1:200000 IJ SOLN
INTRAMUSCULAR | Status: AC
  Filled 2021-08-27: qty 30

## 2021-08-27 MED ORDER — CEFAZOLIN SODIUM-DEXTROSE 2-4 GM/100ML-% IV SOLN
2.0000 g | INTRAVENOUS | Status: AC
  Administered 2021-08-27: 2 g via INTRAVENOUS
  Filled 2021-08-27: qty 100

## 2021-08-27 MED ORDER — LIDOCAINE 2% (20 MG/ML) 5 ML SYRINGE
INTRAMUSCULAR | Status: DC | PRN
  Administered 2021-08-27: 100 mg via INTRAVENOUS

## 2021-08-27 MED ORDER — CHLORHEXIDINE GLUCONATE 0.12 % MT SOLN
15.0000 mL | Freq: Once | OROMUCOSAL | Status: AC
  Administered 2021-08-27: 15 mL via OROMUCOSAL

## 2021-08-27 MED ORDER — LORATADINE 10 MG PO TABS
10.0000 mg | ORAL_TABLET | Freq: Every evening | ORAL | Status: DC
  Administered 2021-08-27: 10 mg via ORAL
  Filled 2021-08-27: qty 1

## 2021-08-27 MED ORDER — ACETAMINOPHEN 500 MG PO TABS
1000.0000 mg | ORAL_TABLET | Freq: Four times a day (QID) | ORAL | Status: DC
  Administered 2021-08-27 – 2021-08-28 (×4): 1000 mg via ORAL
  Filled 2021-08-27 (×4): qty 2

## 2021-08-27 MED ORDER — MIDAZOLAM HCL 2 MG/2ML IJ SOLN
INTRAMUSCULAR | Status: AC
  Filled 2021-08-27: qty 2

## 2021-08-27 MED ORDER — BUPROPION HCL ER (XL) 150 MG PO TB24
150.0000 mg | ORAL_TABLET | Freq: Every day | ORAL | Status: DC
  Administered 2021-08-28: 150 mg via ORAL
  Filled 2021-08-27: qty 1

## 2021-08-27 MED ORDER — CHLORHEXIDINE GLUCONATE CLOTH 2 % EX PADS: 6.0000 | MEDICATED_PAD | Freq: Once | CUTANEOUS | Status: DC

## 2021-08-27 MED ORDER — OXYCODONE HCL 5 MG/5ML PO SOLN: 5.0000 mg | Freq: Once | ORAL | Status: DC | PRN

## 2021-08-27 MED ORDER — OXYCODONE HCL 5 MG PO TABS: 5.0000 mg | ORAL_TABLET | Freq: Once | ORAL | Status: DC | PRN

## 2021-08-27 MED ORDER — BUPIVACAINE-EPINEPHRINE 0.25% -1:200000 IJ SOLN
INTRAMUSCULAR | Status: DC | PRN
Start: 2021-08-27 — End: 2021-08-27
  Administered 2021-08-27: 30 mL

## 2021-08-27 MED ORDER — LACTATED RINGERS IV SOLN: INTRAVENOUS | Status: DC

## 2021-08-27 MED ORDER — PHENYLEPHRINE 40 MCG/ML (10ML) SYRINGE FOR IV PUSH (FOR BLOOD PRESSURE SUPPORT)
PREFILLED_SYRINGE | INTRAVENOUS | Status: AC
  Filled 2021-08-27: qty 10

## 2021-08-27 MED ORDER — GLYCOPYRROLATE 0.2 MG/ML IJ SOLN
INTRAMUSCULAR | Status: AC
  Filled 2021-08-27: qty 1

## 2021-08-27 MED ORDER — MORPHINE SULFATE (PF) 2 MG/ML IV SOLN: 2.0000 mg | INTRAVENOUS | Status: DC | PRN

## 2021-08-27 MED ORDER — ROCURONIUM BROMIDE 10 MG/ML (PF) SYRINGE
PREFILLED_SYRINGE | INTRAVENOUS | Status: DC | PRN
  Administered 2021-08-27: 40 mg via INTRAVENOUS
  Administered 2021-08-27: 30 mg via INTRAVENOUS
  Administered 2021-08-27 (×2): 20 mg via INTRAVENOUS

## 2021-08-27 MED ORDER — FAMOTIDINE 20 MG PO TABS
40.0000 mg | ORAL_TABLET | Freq: Every day | ORAL | Status: DC
  Administered 2021-08-27 – 2021-08-28 (×2): 40 mg via ORAL
  Filled 2021-08-27 (×2): qty 2

## 2021-08-27 MED ORDER — ATORVASTATIN CALCIUM 10 MG PO TABS
10.0000 mg | ORAL_TABLET | Freq: Every day | ORAL | Status: DC
  Administered 2021-08-27 – 2021-08-28 (×2): 10 mg via ORAL
  Filled 2021-08-27 (×2): qty 1

## 2021-08-27 MED ORDER — DEXAMETHASONE SODIUM PHOSPHATE 4 MG/ML IJ SOLN
INTRAMUSCULAR | Status: DC | PRN
Start: 2021-08-27 — End: 2021-08-27
  Administered 2021-08-27: 10 mg via INTRAVENOUS

## 2021-08-27 MED ORDER — 0.9 % SODIUM CHLORIDE (POUR BTL) OPTIME
TOPICAL | Status: DC | PRN
  Administered 2021-08-27: 1000 mL

## 2021-08-27 MED ORDER — ONDANSETRON HCL 4 MG/2ML IJ SOLN: 4.0000 mg | Freq: Four times a day (QID) | INTRAMUSCULAR | Status: DC | PRN

## 2021-08-27 MED ORDER — ONDANSETRON 4 MG PO TBDP: 4.0000 mg | ORAL_TABLET | Freq: Four times a day (QID) | ORAL | Status: DC | PRN

## 2021-08-27 MED ORDER — ENSURE PRE-SURGERY PO LIQD
296.0000 mL | Freq: Once | ORAL | Status: DC
  Filled 2021-08-27: qty 296

## 2021-08-27 MED ORDER — ONDANSETRON HCL 4 MG/2ML IJ SOLN: 4.0000 mg | Freq: Once | INTRAMUSCULAR | Status: DC | PRN

## 2021-08-27 MED ORDER — MONTELUKAST SODIUM 10 MG PO TABS
10.0000 mg | ORAL_TABLET | Freq: Every evening | ORAL | Status: DC | PRN
  Administered 2021-08-27: 10 mg via ORAL
  Filled 2021-08-27: qty 1

## 2021-08-27 MED ORDER — OXYCODONE HCL 5 MG PO TABS
5.0000 mg | ORAL_TABLET | ORAL | Status: DC | PRN
  Administered 2021-08-28 (×2): 5 mg via ORAL
  Filled 2021-08-27 (×2): qty 1

## 2021-08-27 MED ORDER — DIPHENHYDRAMINE HCL 25 MG PO CAPS
25.0000 mg | ORAL_CAPSULE | Freq: Four times a day (QID) | ORAL | Status: DC | PRN
  Filled 2021-08-27: qty 1

## 2021-08-27 MED ORDER — HYDROMORPHONE HCL 1 MG/ML IJ SOLN: 0.2500 mg | INTRAMUSCULAR | Status: DC | PRN

## 2021-08-27 MED ORDER — METOPROLOL TARTRATE 5 MG/5ML IV SOLN: 5.0000 mg | Freq: Four times a day (QID) | INTRAVENOUS | Status: DC | PRN

## 2021-08-27 MED ORDER — PROMETHAZINE HCL 25 MG PO TABS: 25.0000 mg | ORAL_TABLET | Freq: Three times a day (TID) | ORAL | Status: DC | PRN

## 2021-08-27 MED ORDER — CELECOXIB 200 MG PO CAPS
400.0000 mg | ORAL_CAPSULE | ORAL | Status: AC
  Administered 2021-08-27: 400 mg via ORAL
  Filled 2021-08-27: qty 2

## 2021-08-27 MED ORDER — ACETAMINOPHEN 500 MG PO TABS
1000.0000 mg | ORAL_TABLET | ORAL | Status: AC
  Administered 2021-08-27: 1000 mg via ORAL
  Filled 2021-08-27: qty 2

## 2021-08-27 MED ORDER — TRAMADOL HCL 50 MG PO TABS
50.0000 mg | ORAL_TABLET | Freq: Four times a day (QID) | ORAL | Status: DC | PRN
  Administered 2021-08-27 (×2): 50 mg via ORAL
  Filled 2021-08-27 (×2): qty 1

## 2021-08-27 MED ORDER — PHENYLEPHRINE 40 MCG/ML (10ML) SYRINGE FOR IV PUSH (FOR BLOOD PRESSURE SUPPORT)
PREFILLED_SYRINGE | INTRAVENOUS | Status: DC | PRN
  Administered 2021-08-27: 120 ug via INTRAVENOUS
  Administered 2021-08-27: 80 ug via INTRAVENOUS

## 2021-08-27 SURGICAL SUPPLY — 40 items
ATTRACTOMAT 16X20 MAGNETIC DRP (DRAPES) ×2 IMPLANT
BAG COUNTER SPONGE SURGICOUNT (BAG) IMPLANT
BLADE SURG 15 STRL LF DISP TIS (BLADE) ×1 IMPLANT
BLADE SURG 15 STRL SS (BLADE) ×1
CHLORAPREP W/TINT 26 (MISCELLANEOUS) ×2 IMPLANT
CLIP TI MEDIUM 6 (CLIP) ×9 IMPLANT
CLIP TI WIDE RED SMALL 6 (CLIP) ×8 IMPLANT
COVER SURGICAL LIGHT HANDLE (MISCELLANEOUS) ×2 IMPLANT
DERMABOND ADVANCED (GAUZE/BANDAGES/DRESSINGS) ×1
DERMABOND ADVANCED .7 DNX12 (GAUZE/BANDAGES/DRESSINGS) ×1 IMPLANT
DRAPE LAPAROTOMY T 98X78 PEDS (DRAPES) ×2 IMPLANT
DRAPE UTILITY XL STRL (DRAPES) ×2 IMPLANT
ELECT REM PT RETURN 15FT ADLT (MISCELLANEOUS) ×2 IMPLANT
GAUZE 4X4 16PLY ~~LOC~~+RFID DBL (SPONGE) ×2 IMPLANT
GLOVE SURG POLYISO LF SZ7 (GLOVE) ×2 IMPLANT
GLOVE SURG UNDER POLY LF SZ7 (GLOVE) ×2 IMPLANT
GOWN STRL REUS W/ TWL XL LVL3 (GOWN DISPOSABLE) IMPLANT
GOWN STRL REUS W/TWL LRG LVL3 (GOWN DISPOSABLE) ×2 IMPLANT
GOWN STRL REUS W/TWL XL LVL3 (GOWN DISPOSABLE) ×2 IMPLANT
HEMOSTAT SURGICEL 2X4 FIBR (HEMOSTASIS) ×2 IMPLANT
ILLUMINATOR WAVEGUIDE N/F (MISCELLANEOUS) ×1 IMPLANT
KIT BASIN OR (CUSTOM PROCEDURE TRAY) ×2 IMPLANT
KIT TURNOVER KIT A (KITS) IMPLANT
NDL HYPO 25X1 1.5 SAFETY (NEEDLE) ×1 IMPLANT
NEEDLE HYPO 25X1 1.5 SAFETY (NEEDLE) ×2 IMPLANT
PACK BASIC VI WITH GOWN DISP (CUSTOM PROCEDURE TRAY) ×2 IMPLANT
PENCIL SMOKE EVACUATOR (MISCELLANEOUS) IMPLANT
SHEARS HARMONIC 9CM CVD (BLADE) ×2 IMPLANT
STAPLER VISISTAT 35W (STAPLE) IMPLANT
SUT MNCRL AB 4-0 PS2 18 (SUTURE) ×2 IMPLANT
SUT SILK 2 0 (SUTURE)
SUT SILK 2-0 18XBRD TIE 12 (SUTURE) IMPLANT
SUT SILK 3 0 (SUTURE)
SUT SILK 3-0 18XBRD TIE 12 (SUTURE) IMPLANT
SUT VIC AB 3-0 SH 18 (SUTURE) ×4 IMPLANT
SYR BULB IRRIG 60ML STRL (SYRINGE) ×2 IMPLANT
SYR CONTROL 10ML LL (SYRINGE) IMPLANT
TOWEL OR 17X26 10 PK STRL BLUE (TOWEL DISPOSABLE) ×2 IMPLANT
TOWEL OR NON WOVEN STRL DISP B (DISPOSABLE) ×2 IMPLANT
TUBING CONNECTING 10 (TUBING) ×2 IMPLANT

## 2021-08-27 NOTE — Anesthesia Preprocedure Evaluation (Addendum)
Anesthesia Evaluation  ?Patient identified by MRN, date of birth, ID band ?Patient awake ? ? ? ?Reviewed: ?Allergy & Precautions, NPO status , Patient's Chart, lab work & pertinent test results ? ?Airway ?Mallampati: III ? ?TM Distance: <3 FB ?Neck ROM: Full ? ? ? Dental ?no notable dental hx. ? ?  ?Pulmonary ?asthma ,  ?  ?Pulmonary exam normal ?breath sounds clear to auscultation ? ? ? ? ? ? Cardiovascular ?negative cardio ROS ?Normal cardiovascular exam ?Rhythm:Regular Rate:Normal ? ? ?  ?Neuro/Psych ?negative neurological ROS ? negative psych ROS  ? GI/Hepatic ?Neg liver ROS, GERD  ,  ?Endo/Other  ?diabetes ? Renal/GU ?negative Renal ROS  ?negative genitourinary ?  ?Musculoskeletal ?negative musculoskeletal ROS ?(+)  ? Abdominal ?  ?Peds ?negative pediatric ROS ?(+)  Hematology ?negative hematology ROS ?(+)   ?Anesthesia Other Findings ? ? Reproductive/Obstetrics ?negative OB ROS ? ?  ? ? ? ? ? ? ? ? ? ? ? ? ? ?  ?  ? ? ? ? ? ? ? ?Anesthesia Physical ?Anesthesia Plan ? ?ASA: 2 ? ?Anesthesia Plan: General  ? ?Post-op Pain Management:   ? ?Induction: Intravenous ? ?PONV Risk Score and Plan: 3 and Ondansetron, Dexamethasone, Droperidol and Treatment may vary due to age or medical condition ? ?Airway Management Planned: Oral ETT ? ?Additional Equipment:  ? ?Intra-op Plan:  ? ?Post-operative Plan: Extubation in OR ? ?Informed Consent: I have reviewed the patients History and Physical, chart, labs and discussed the procedure including the risks, benefits and alternatives for the proposed anesthesia with the patient or authorized representative who has indicated his/her understanding and acceptance.  ? ? ? ?Dental advisory given ? ?Plan Discussed with: CRNA and Surgeon ? ?Anesthesia Plan Comments:   ? ? ? ? ? ? ?Anesthesia Quick Evaluation ? ?

## 2021-08-27 NOTE — Anesthesia Procedure Notes (Addendum)
Procedure Name: Intubation ?Date/Time: 08/27/2021 7:33 AM ?Performed by: Claudia Desanctis, CRNA ?Pre-anesthesia Checklist: Patient identified, Emergency Drugs available, Suction available and Patient being monitored ?Patient Re-evaluated:Patient Re-evaluated prior to induction ?Oxygen Delivery Method: Circle system utilized ?Preoxygenation: Pre-oxygenation with 100% oxygen ?Induction Type: IV induction ?Ventilation: Two handed mask ventilation required and Oral airway inserted - appropriate to patient size ?Laryngoscope Size: 2 and Miller ?Grade View: Grade II ?Tube type: Oral ?Tube size: 7.0 mm ?Number of attempts: 1 ?Airway Equipment and Method: Stylet ?Placement Confirmation: ETT inserted through vocal cords under direct vision, positive ETCO2 and breath sounds checked- equal and bilateral ?Secured at: 21 cm ?Tube secured with: Tape ?Dental Injury: Teeth and Oropharynx as per pre-operative assessment  ?Difficulty Due To: Difficult Airway- due to dentition and Difficult Airway- due to limited oral opening ? ? ? ? ?

## 2021-08-27 NOTE — Op Note (Signed)
Preoperative diagnosis: multinodular goiter ? ?Postoperative diagnosis: same  ? ?Procedure: total thyroidectomy ? ?Surgeon: Gurney Maxin, M.D. ? ?Asst: Armandina Gemma, M.D. ? ?Anesthesia: general ? ?Indications for procedure: Kristina Watts is a 51 y.o. year old female with symptoms of hoarseness and swallowing issues and increased swelling on left neck. ? ?Description of procedure: The patient was brought to the operating room and placed in a supine position on the operating room table. Following administration of general anesthesia, the patient was positioned and then prepped and draped in the usual aseptic fashion.  ? ?A small Kocher incision was made. Dissection was carried through subcutaneous tissues and platysma. Hemostasis was achieved with the electrocautery. Skin flaps were elevated cephalad and caudad from the thyroid notch to the sternal notch. A [place] self-retaining retractor was placed for exposure. Strap muscles were incised in the midline and dissection was begun on the left side.  Strap muscles were reflected laterally. Left thyroid lobe was very large and well perfused.  The left lobe was gently mobilized with blunt dissection. Superior pole vessels were dissected out and divided individually between small and medium ligaclips with the harmonic scalpel. The thyroid lobe was rolled anteriorly. Branches of the inferior thyroid artery were divided between small ligaclips with the harmonic scalpel. Inferior venous tributaries were divided between ligaclips. Both the superior and inferior parathyroid glands were identified and preserved on their vascular pedicles. The recurrent laryngeal nerve was identified and preserved along its course. The ligament of Gwenlyn Found was released with the electrocautery and the gland was mobilized onto the anterior trachea. Isthmus was mobilized across the midline. There was a pyramidal lobe present and was dissected free of surrounding tissue. Dry pack was placed in the  left neck. ? ?The right thyroid lobe was gently mobilized with blunt dissection. Right thyroid lobe was normal in size. Superior pole vessels were dissected out and divided between small and medium ligaclips with the Harmonic scalpel. Superior parathyroid was identified and preserved. Inferior venous tributaries were divided between medium ligaclips with the harmonic scalpel. The right  thyroid lobe was rolled anteriorly and the branches of the inferior thyroid artery divided between small ligaclips. The recurrent laryngeal nerve was identified and preserved along its course. The ligament of Gwenlyn Found was released with the electrocautery. The thyroid lobe was mobilized onto the anterior trachea and the remainder of the thyroid was dissected off the anterior trachea and the thyroid was completely excised. A suture was used to mark the right lobe. The entire thyroid gland was submitted to pathology for review. ? ?The neck was irrigated with warm saline. Fibrillar was placed throughout the operative field. Strap muscles were approximated in the midline with interrupted 3-0 Vicryl sutures. Platysma was closed with interrupted 3-0 Vicryl sutures. Skin was closed with a running 4-0 Monocryl subcuticular suture. Wound was washed and Dermabond was applied. The patient was awakened from anesthesia and brought to the recovery room. The patient tolerated the procedure well. ? ? ?Findings: large multinodular left thyroid lobe ? ?Specimen: thyroid, stitch marks right superior ? ?Implant: fibrillar  ? ?Blood loss: 50 ml ? ?Local anesthesia:  30 ml marcaine  ? ?Complications: none ? ?Gurney Maxin, M.D. ?General, Bariatric, & Minimally Invasive Surgery ?[facility], [place] ? ?

## 2021-08-27 NOTE — Anesthesia Postprocedure Evaluation (Signed)
Anesthesia Post Note ? ?Patient: Taytem A Coalson ? ?Procedure(s) Performed: TOTAL THYROIDECTOMY (Neck) ? ?  ? ?Patient location during evaluation: PACU ?Anesthesia Type: General ?Level of consciousness: awake and alert ?Pain management: pain level controlled ?Vital Signs Assessment: post-procedure vital signs reviewed and stable ?Respiratory status: spontaneous breathing, nonlabored ventilation, respiratory function stable and patient connected to nasal cannula oxygen ?Cardiovascular status: blood pressure returned to baseline and stable ?Postop Assessment: no apparent nausea or vomiting ?Anesthetic complications: no ? ? ?No notable events documented. ? ?Last Vitals:  ?Vitals:  ? 08/27/21 1115 08/27/21 1150  ?BP: (!) 162/88 (!) 161/85  ?Pulse: 100 95  ?Resp: 17 16  ?Temp:  37.1 ?C  ?SpO2: 94% 96%  ?  ?Last Pain:  ?Vitals:  ? 08/27/21 1115  ?TempSrc:   ?PainSc: Asleep  ? ? ?  ?  ?  ?  ?  ?  ? ?Kimberlea Schlag S ? ? ? ? ?

## 2021-08-27 NOTE — Plan of Care (Signed)
Pt transfer from stretcher to [place]  20 from PACU  ?

## 2021-08-27 NOTE — H&P (Signed)
?Chief Complaint: Thyroid Nodule ? ? ?History of Present Illness: ?Kristina Watts is a 51 y.o. female who is seen today as an office consultation at the request of Dr. Charlett Blake for evaluation of Thyroid Nodule ?.  ? ?She has noticed her neck is larger in the last 3 months. She also notes easy fatigueability of her voice and some hoarseness. She has some new difficulty with swallowing. She denies weight changes. She denies hot/cold intolerance. ? ?Review of Systems: ?A complete review of systems was obtained from the patient. I have reviewed this information and discussed as appropriate with the patient. See HPI as well for other ROS. ? ?Review of Systems  ?Constitutional: Negative.  ?HENT: Negative.  ?Eyes: Negative.  ?Respiratory: Negative.  ?Cardiovascular: Negative.  ?Gastrointestinal: Negative.  ?Genitourinary: Negative.  ?Musculoskeletal: Negative.  ?Skin: Negative.  ?Neurological: Negative.  ?Endo/Heme/Allergies: Negative.  ?Psychiatric/Behavioral: Negative.  ? ? ?Medical History: ?Past Medical History:  ?Diagnosis Date  ? Asthma, unspecified asthma severity, unspecified whether complicated, unspecified whether persistent  ? Diabetes mellitus without complication (CMS-HCC)  ? Hyperlipidemia  ? Thyroid disease  ? ?There is no problem list on file for this patient. ? ?Past Surgical History:  ?Procedure Laterality Date  ? APPENDECTOMY  ? CESAREAN SECTION  ?2002 and 2004  ? CHOLECYSTECTOMY  ? ? ?No Known Allergies ? ?Current Outpatient Medications on File Prior to Visit  ?Medication Sig Dispense Refill  ? acetaminophen (TYLENOL) 500 MG tablet Take 2 tablets by mouth as needed  ? atorvastatin (LIPITOR) 10 MG tablet Take 1 tablet by mouth once daily  ? azelastine-fluticasone 137-50 mcg/spray [place] into one nostril  ? buPROPion (WELLBUTRIN XL) 150 MG XL tablet Take 1 tablet by mouth once daily  ? busPIRone (BUSPAR) 5 MG tablet Take 5 mg by mouth 2 (two) times daily as needed  ? butalbital-acetaminophen-caffeine  (FIORICET) 50-325-40 mg capsule Take 1 capsule by mouth every 4 (four) hours as needed for Pain  ? dextroamphetamine-amphetamine (ADDERALL) 10 mg tablet Take 10 mg by mouth  ? famotidine (PEPCID) 40 MG tablet Take 40 mg by mouth once daily  ? ibuprofen (MOTRIN) 200 MG tablet Take 200 mg by mouth every 6 (six) hours as needed  ? ketoconazole (NIZORAL) 2 % cream Apply topically once daily  ? levocetirizine (XYZAL) 5 MG tablet Take 5 mg by mouth every evening  ? metFORMIN (GLUCOPHAGE) 1000 MG tablet Take by mouth  ? methylPREDNISolone (MEDROL) 4 MG tablet 5 tabs po x 1 day then 4 tabs po x 1 day then 3 tabs po x 1 day then 2 tabs po x 1 day then 1 tab po x 1 day and stop  ? montelukast (SINGULAIR) 10 mg tablet Take 10 mg by mouth at bedtime  ? multivitamin tablet Take 1 tablet by mouth once daily  ? nitrofurantoin, macrocrystal-monohydrate, (MACROBID) 100 MG capsule Take 100 mg by mouth 2 (two) times daily  ? promethazine (PHENERGAN) 25 MG tablet Take 25 mg by mouth every 6 (six) hours as needed for Nausea  ? valACYclovir (VALTREX) 1000 MG tablet Take 1,000 mg by mouth 2 (two) times daily  ? venlafaxine (EFFEXOR-XR) 75 MG XR capsule TAKE 1 CAPSULE (75 MG TOTAL) BY MOUTH 2 (TWO) TIMES DAILY WITH BREAKFAST AND LUNCH.  ? ?No current facility-administered medications on file prior to visit.  ? ?Family History  ?Problem Relation Age of Onset  ? Diabetes Mother  ? Stroke Father  ? High blood pressure (Hypertension) Father  ? Hyperlipidemia (Elevated cholesterol)  Father  ? Coronary Artery Disease (Blocked arteries around heart) Father  ? Diabetes Father  ? Diabetes Brother  ? ? ?Social History  ? ?Tobacco Use  ?Smoking Status Never  ?Smokeless Tobacco Never  ? ? ?Social History  ? ?Socioeconomic History  ? Marital status: Married  ?Tobacco Use  ? Smoking status: Never  ? Smokeless tobacco: Never  ?Substance and Sexual Activity  ? Alcohol use: Yes  ? Drug use: Not Currently  ? ?Objective:  ? ?Vitals:  ?07/15/21 0937  ?BP:  110/62  ?Pulse: 106  ?Weight: (!) 103.4 kg (228 lb)  ?Height: 165.1 cm ('5\' 5"'$ )  ? ?Body mass index is 37.94 kg/m?. ? ?Physical Exam ?Constitutional:  ?Appearance: Normal appearance.  ?HENT:  ?Head: Normocephalic and atraumatic.  ?Neck:  ?Comments: Left side large nodule, no lymphadenopathy ?Pulmonary:  ?Effort: Pulmonary effort is normal.  ?Musculoskeletal:  ?General: Normal range of motion.  ?Cervical back: Normal range of motion.  ?Neurological:  ?General: No focal deficit present.  ?Mental Status: She is alert and oriented to person, place, and time. Mental status is at baseline.  ?Psychiatric:  ?Mood and Affect: Mood normal.  ?Behavior: Behavior normal.  ?Thought Content: Thought content normal.  ? ? ?Labs, Imaging and Diagnostic Testing: ?I reviewed US thyroid results and notes by Penni Homans ? ?Assessment and Plan:  ? ?Diagnoses and all orders for this visit: ? ?Thyroid nodule ?- CT neck soft tissue with contrast; Future ? ?Pharyngeal dysphagia ? ?Hoarseness ? ?We discussed symptoms and US findings. I recommended FNA biopsy as next step. She did not want to proceed with that and wanted to move forward with surgery. I discussed that a biopsy can help guide the surgery and allow appropriate risk decisions and proceeding without tissue diagnosis may lead to assumptions however she did not change her position. ? ?Given size of nodule, we will proceed with CT of soft tissue neck and then total thyroidectomy. We discussed details of the surgery with general anesthesia and removal of the thyroid and ligation of multiple vessels in the neck. We discussed potential complications of bleeding, prolonged intubation, laryngeal nerve injury, parathyroid gland removal resulting in hypocalcemia, and need for thyroid hormone supplement lifelong. She showed understanding and wanted to proceed. ?  ? ?

## 2021-08-27 NOTE — Transfer of Care (Signed)
Immediate Anesthesia Transfer of Care Note ? ?Patient: Kristina Watts ? ?Procedure(s) Performed: TOTAL THYROIDECTOMY (Neck) ? ?Patient Location: PACU ? ?Anesthesia Type:General ? ?Level of Consciousness: drowsy ? ?Airway & Oxygen Therapy: Patient Spontanous Breathing and Patient connected to face mask oxygen ? ?Post-op Assessment: Report given to RN and Post -op Vital signs reviewed and stable ? ?Post vital signs: Reviewed and stable ? ?Last Vitals:  ?Vitals Value Taken Time  ?BP 165/84 08/27/21 1040  ?Temp    ?Pulse 102 08/27/21 1044  ?Resp 16 08/27/21 1044  ?SpO2 95 % 08/27/21 1044  ?Vitals shown include unvalidated device data. ? ?Last Pain:  ?Vitals:  ? 08/27/21 0611  ?TempSrc: Oral  ?PainSc:   ?   ? ?  ? ?Complications: No notable events documented. ?

## 2021-08-28 ENCOUNTER — Encounter (HOSPITAL_COMMUNITY): Payer: Self-pay | Admitting: General Surgery

## 2021-08-28 DIAGNOSIS — E042 Nontoxic multinodular goiter: Secondary | ICD-10-CM | POA: Diagnosis not present

## 2021-08-28 LAB — BASIC METABOLIC PANEL
Anion gap: 8 (ref 5–15)
BUN: 12 mg/dL (ref 6–20)
CO2: 27 mmol/L (ref 22–32)
Calcium: 8.7 mg/dL — ABNORMAL LOW (ref 8.9–10.3)
Chloride: 103 mmol/L (ref 98–111)
Creatinine, Ser: 0.59 mg/dL (ref 0.44–1.00)
GFR, Estimated: >60 mL/min (ref >60)
Glucose, Bld: 163 mg/dL — ABNORMAL HIGH (ref 70–99)
Potassium: 3.8 mmol/L (ref 3.5–5.1)
Sodium: 138 mmol/L (ref 135–145)

## 2021-08-28 LAB — CBC
HCT: 34.9 % — ABNORMAL LOW (ref 36.0–46.0)
Hemoglobin: 11.5 g/dL — ABNORMAL LOW (ref 12.0–15.0)
MCH: 29.6 pg (ref 26.0–34.0)
MCHC: 33 g/dL (ref 30.0–36.0)
MCV: 89.7 fL (ref 80.0–100.0)
Platelets: 321 10*3/uL (ref 150–400)
RBC: 3.89 MIL/uL (ref 3.87–5.11)
RDW: 13.7 % (ref 11.5–15.5)
WBC: 13.8 10*3/uL — ABNORMAL HIGH (ref 4.0–10.5)
nRBC: 0 % (ref 0.0–0.2)

## 2021-08-28 MED ORDER — OXYCODONE HCL 5 MG PO TABS: 5.0000 mg | ORAL_TABLET | ORAL | 0 refills | Status: DC | PRN

## 2021-08-28 MED ORDER — LEVOTHYROXINE SODIUM 100 MCG PO TABS: 100.0000 ug | ORAL_TABLET | Freq: Every day | ORAL | 11 refills | Status: DC

## 2021-08-28 MED ORDER — PHENOL 1.4 % MT LIQD
1.0000 | OROMUCOSAL | Status: DC | PRN
  Filled 2021-08-28: qty 177

## 2021-08-28 NOTE — Discharge Summary (Signed)
Physician Discharge Summary  ?Patient ID: ?Kristina Watts ?MRN: [id number] ?DOB/AGE: [birth year] 49 y.o. ? ?Admit date: 08/27/2021 ?Discharge date: 08/28/2021 ? ?Admission Diagnoses:thryoid nodule ? ?Discharge Diagnoses:  ?Principal Problem: ?  Thyroid mass ? ? ?Discharged Condition: good ? ?Hospital Course: PT admitted post op.  Please see op not for full details.  Doing well with pain.  Pt with min low Ca on DC.  Pt OK for DC ? ?Consults: None ? ?Significant Diagnostic Studies: none ? ?Treatments: surgery: as above ? ?Discharge Exam: ?Blood pressure 114/76, pulse 91, temperature 98 ?F (36.7 ?C), temperature source Oral, resp. rate 14, height 5' 5" (1.651 m), weight 102.3 kg, last menstrual period 08/09/2021, SpO2 97 %. ?General appearance: alert and cooperative ?Incision/Wound: inc c/d/i ? ?Disposition:  ? ? ?Allergies as of 08/28/2021   ? ?   Reactions  ? Actifed [triprolidine-pse] Hives  ? ?  ? ?  ?Medication List  ?  ? ?TAKE these medications   ? ?acetaminophen 500 MG tablet ?Commonly known as: TYLENOL ?Take 1,000 mg by mouth every 6 (six) hours as needed for moderate pain. ?  ?amphetamine-dextroamphetamine 10 MG tablet ?Commonly known as: Adderall ?Take 1 tablet (10 mg total) by mouth daily. February 2021 ?What changed:  ?when to take this ?reasons to take this ?additional instructions ?  ?atorvastatin 10 MG tablet ?Commonly known as: LIPITOR ?TAKE 1 TABLET BY MOUTH EVERY DAY ?  ?buPROPion 150 MG 24 hr tablet ?Commonly known as: WELLBUTRIN XL ?TAKE 1 TABLET BY MOUTH EVERY DAY ?  ?busPIRone 5 MG tablet ?Commonly known as: BUSPAR ?Take 1 tablet (5 mg total) by mouth 2 (two) times daily as needed. ?  ?Butalbital-APAP 50-325 MG Tabs ?Take 1 tablet by mouth 3 (three) times daily as needed. ?  ?famotidine 40 MG tablet ?Commonly known as: PEPCID ?TAKE 1 TABLET BY MOUTH EVERY DAY ?  ?ibuprofen 200 MG tablet ?Commonly known as: ADVIL ?Take 400 mg by mouth every 6 (six) hours as needed for headache or moderate pain. ?   ?ketoconazole 2 % cream ?Commonly known as: NIZORAL ?Apply 1 application topically daily. ?What changed:  ?when to take this ?reasons to take this ?  ?levocetirizine 5 MG tablet ?Commonly known as: XYZAL ?Take 5 mg by mouth every evening. ?  ?levothyroxine 100 MCG tablet ?Commonly known as: Synthroid ?Take 1 tablet (100 mcg total) by mouth daily. ?  ?melatonin 3 MG Tabs tablet ?Take 3 mg by mouth at bedtime. ?  ?metFORMIN 1000 MG tablet ?Commonly known as: GLUCOPHAGE ?TAKE 1 TABLET (1,000 MG TOTAL) BY MOUTH 2 (TWO) TIMES DAILY WITH A MEAL. ?  ?methylPREDNISolone 4 MG tablet ?Commonly known as: Medrol ?5 tabs po x 1 day then 4 tabs po x 1 day then 3 tabs po x 1 day then 2 tabs po x 1 day then 1 tab po x 1 day and stop ?  ?montelukast 10 MG tablet ?Commonly known as: SINGULAIR ?Take 1 tablet (10 mg total) by mouth at bedtime. ?What changed:  ?when to take this ?reasons to take this ?  ?multivitamin tablet ?Take 1 tablet by mouth daily. ?  ?nitrofurantoin (macrocrystal-monohydrate) 100 MG capsule ?Commonly known as: Macrobid ?Take 1 capsule (100 mg total) by mouth 2 (two) times daily. ?  ?OneTouch Delica Plus [id number] Misc ?Use to check sugar once a day and as needed.  Dx code: E11.9 ?  ?OneTouch Verio Reflect w/Device Kit ?USE TO CHECK SUGAR ONCE A DAY AND AS NEEDED. DX CODE: E11.9 ?  ?  OneTouch Verio test strip ?Generic drug: glucose blood ?USE TO CHECK SUGAR TWICE A DAY AND AS NEEDED. DX CODE: E11.65 ?  ?oxyCODONE 5 MG immediate release tablet ?Commonly known as: Oxy IR/ROXICODONE ?Take 1 tablet (5 mg total) by mouth every 4 (four) hours as needed for moderate pain. ?  ?promethazine 25 MG tablet ?Commonly known as: PHENERGAN ?Take 1 tablet (25 mg total) by mouth every 8 (eight) hours as needed for nausea or vomiting. ?  ?Symbicort 160-4.5 MCG/ACT inhaler ?Generic drug: budesonide-formoterol ?TAKE 2 PUFFS BY MOUTH TWICE A DAY ?What changed: See the new instructions. ?  ?valACYclovir 1000 MG tablet ?Commonly known  as: VALTREX ?Take 1 tablet (1,000 mg total) by mouth 2 (two) times daily. ?What changed:  ?when to take this ?reasons to take this ?  ?venlafaxine XR 75 MG 24 hr capsule ?Commonly known as: EFFEXOR-XR ?TAKE 1 CAPSULE (75 MG TOTAL) BY MOUTH 2 (TWO) TIMES DAILY WITH BREAKFAST AND LUNCH. ?  ? ?  ? ? Follow-up Information   ? ? Kinsinger, Arta Bruce, MD. Schedule an appointment as soon as possible for a visit in 2 week(s).   ?Specialty: General Surgery ?Why: Post op visit ?Contact information: ?[address] ?[address] ?[place] [address] ?[phone] ? ? ?  ?  ? ?  ?  ? ?  ? ? ?Signed: ?Ralene Ok ?08/28/2021, 9:02 AM ? ? ?

## 2021-08-28 NOTE — Progress Notes (Signed)
Patient provided dc instructions; Discharge instructions provided and explained in detail; pt escorted to lobby in wheel chair to be discharged home with family  ?

## 2021-08-30 LAB — SURGICAL PATHOLOGY

## 2021-08-31 ENCOUNTER — Telehealth: Payer: Self-pay | Admitting: General Surgery

## 2021-08-31 NOTE — Telephone Encounter (Signed)
I discussed pathology findings of the thyroid resection. 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 Both were negative at margins. Neither had lymphovascular invasion. She showed good understanding. She will follow up in office. ?

## 2021-09-01 ENCOUNTER — Other Ambulatory Visit: Payer: Self-pay | Admitting: Family Medicine

## 2021-10-04 ENCOUNTER — Other Ambulatory Visit (INDEPENDENT_AMBULATORY_CARE_PROVIDER_SITE_OTHER): Payer: Managed Care, Other (non HMO)

## 2021-10-04 DIAGNOSIS — E119 Type 2 diabetes mellitus without complications: Secondary | ICD-10-CM | POA: Diagnosis not present

## 2021-10-04 DIAGNOSIS — R739 Hyperglycemia, unspecified: Secondary | ICD-10-CM | POA: Diagnosis not present

## 2021-10-04 DIAGNOSIS — E785 Hyperlipidemia, unspecified: Secondary | ICD-10-CM | POA: Diagnosis not present

## 2021-10-05 LAB — CBC WITH DIFFERENTIAL/PLATELET
Basophils Absolute: 0.1 10*3/uL (ref 0.0–0.1)
Basophils Relative: 0.9 % (ref 0.0–3.0)
Eosinophils Absolute: 0.1 10*3/uL (ref 0.0–0.7)
Eosinophils Relative: 1.2 % (ref 0.0–5.0)
HCT: 34.7 % — ABNORMAL LOW (ref 36.0–46.0)
Hemoglobin: 11.9 g/dL — ABNORMAL LOW (ref 12.0–15.0)
Lymphocytes Relative: 20.7 % (ref 12.0–46.0)
Lymphs Abs: 1.7 10*3/uL (ref 0.7–4.0)
MCHC: 34.3 g/dL (ref 30.0–36.0)
MCV: 87.5 fl (ref 78.0–100.0)
Monocytes Absolute: 0.5 10*3/uL (ref 0.1–1.0)
Monocytes Relative: 5.6 % (ref 3.0–12.0)
Neutro Abs: 6 10*3/uL (ref 1.4–7.7)
Neutrophils Relative %: 71.6 % (ref 43.0–77.0)
Platelets: 324 10*3/uL (ref 150.0–400.0)
RBC: 3.96 Mil/uL (ref 3.87–5.11)
RDW: 14.1 % (ref 11.5–15.5)
WBC: 8.4 10*3/uL (ref 4.0–10.5)

## 2021-10-05 LAB — COMPREHENSIVE METABOLIC PANEL
ALT: 37 U/L — ABNORMAL HIGH (ref 0–35)
AST: 22 U/L (ref 0–37)
Albumin: 4.5 g/dL (ref 3.5–5.2)
Alkaline Phosphatase: 67 U/L (ref 39–117)
BUN: 11 mg/dL (ref 6–23)
CO2: 27 mEq/L (ref 19–32)
Calcium: 9 mg/dL (ref 8.4–10.5)
Chloride: 101 mEq/L (ref 96–112)
Creatinine, Ser: 0.81 mg/dL (ref 0.40–1.20)
GFR: 85.76 mL/min (ref >60.00)
Glucose, Bld: 143 mg/dL — ABNORMAL HIGH (ref 70–99)
Potassium: 3.9 mEq/L (ref 3.5–5.1)
Sodium: 137 mEq/L (ref 135–145)
Total Bilirubin: 0.4 mg/dL (ref 0.2–1.2)
Total Protein: 6.9 g/dL (ref 6.0–8.3)

## 2021-10-05 LAB — LIPID PANEL
Cholesterol: 176 mg/dL (ref 0–200)
HDL: 43.5 mg/dL (ref >39.00)
NonHDL: 132.33
Total CHOL/HDL Ratio: 4
Triglycerides: 241 mg/dL — ABNORMAL HIGH (ref 0.0–149.0)
VLDL: 48.2 mg/dL — ABNORMAL HIGH (ref 0.0–40.0)

## 2021-10-05 LAB — HEMOGLOBIN A1C: Hgb A1c MFr Bld: 7.6 % — ABNORMAL HIGH (ref 4.6–6.5)

## 2021-10-05 LAB — TSH: TSH: 27.87 u[IU]/mL — ABNORMAL HIGH (ref 0.35–5.50)

## 2021-10-05 LAB — LDL CHOLESTEROL, DIRECT: Direct LDL: 113 mg/dL

## 2021-10-06 ENCOUNTER — Encounter: Payer: Self-pay | Admitting: Family Medicine

## 2021-10-06 ENCOUNTER — Other Ambulatory Visit: Payer: Self-pay

## 2021-10-06 DIAGNOSIS — E079 Disorder of thyroid, unspecified: Secondary | ICD-10-CM

## 2021-10-06 MED ORDER — LEVOTHYROXINE SODIUM 125 MCG PO TABS: 125.0000 ug | ORAL_TABLET | Freq: Every day | ORAL | 3 refills | Status: DC

## 2021-11-01 ENCOUNTER — Ambulatory Visit (INDEPENDENT_AMBULATORY_CARE_PROVIDER_SITE_OTHER): Payer: Managed Care, Other (non HMO) | Admitting: Family Medicine

## 2021-11-01 ENCOUNTER — Ambulatory Visit: Payer: Managed Care, Other (non HMO) | Admitting: Family Medicine

## 2021-11-01 ENCOUNTER — Encounter: Payer: Self-pay | Admitting: Family Medicine

## 2021-11-01 VITALS — BP 136/89 | HR 95 | Ht 65.0 in | Wt 229.8 lb

## 2021-11-01 DIAGNOSIS — F988 Other specified behavioral and emotional disorders with onset usually occurring in childhood and adolescence: Secondary | ICD-10-CM | POA: Diagnosis not present

## 2021-11-01 DIAGNOSIS — E119 Type 2 diabetes mellitus without complications: Secondary | ICD-10-CM

## 2021-11-01 DIAGNOSIS — E039 Hypothyroidism, unspecified: Secondary | ICD-10-CM | POA: Diagnosis not present

## 2021-11-01 DIAGNOSIS — E785 Hyperlipidemia, unspecified: Secondary | ICD-10-CM

## 2021-11-01 DIAGNOSIS — J069 Acute upper respiratory infection, unspecified: Secondary | ICD-10-CM

## 2021-11-01 MED ORDER — AMPHETAMINE-DEXTROAMPHETAMINE 10 MG PO TABS: 10.0000 mg | ORAL_TABLET | Freq: Every day | ORAL | 0 refills | Status: DC | PRN

## 2021-11-01 NOTE — Progress Notes (Signed)
? ?Established Patient Office Visit ? ?Subjective   ?Patient ID: Kristina Watts, female    DOB: 16-Nov-1970  Age: 51 y.o. MRN: [id number] ? ?CC: routine f/u, URI symptoms  ? ? ?HPI ? ?Patient last saw PCP on 07/22/21 for routine follow-up. At that time she had a noted thyroid mass/nodule for several months and was awaiting surgery which was performed on 08/27/21 (total thyroidectomy). She was then started on levothyroxine 100 mcg daily. PCP rechecked labs on 10/04/21 and levothyroxine was increased to 125 mcg daily with orders to recheck in 6-8 weeks (approximately 11/24/21). Reports she has been doing really well overall. Other than recently developing some URI symptoms over the past 2-3 days. She reports some post nasal drainage, hoarse voice, occasional cough, nasal congestion. She denies any sinus pressure/headaches, fatigue, body aches, malaise, fevers/chills, sneezing. 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 She just started taking Mucinex today.  ? ? ?HYPERLIPIDEMIA ?- medications: atorvastatin 10 mg daily  ?- compliance: good ?- medication SEs: none ?The 10-year ASCVD risk score (Arnett DK, et al., 2019) is: 2.6% ?  Values used to calculate the score: ?    Age: 10 years ?    Sex: Female ?    Is Non-Hispanic African American: No ?    Diabetic: Yes ?    Tobacco smoker: No ?    Systolic Blood Pressure: 824 mmHg ?    Is BP treated: No ?    HDL Cholesterol: 43.5 mg/dL ?    Total Cholesterol: 176 mg/dL ? ? ?DIABETES: ?- Checking BG at home: yes, fasting around 180's at times; A1c 7.6% last month ?- Medications: metformin 1,000 mg BID ?- Compliance: yes ?- Diet: low carb ?- Exercise: walking ?- Denies symptoms of hypoglycemia, polyuria, polydipsia, numbness extremities, foot ulcers/trauma, wounds that are not healing, medication side effects  ? ? ?ADD: ?- Medications: Adderall 10 mg daily as needed ?- Just recently started up again as needed since returning to work after being off  for surgery  ?- No side effects. Needs refill.  ? ? ? ? ? ?ROS ?All review of systems negative except what is listed in the HPI ? ?  ?Objective:  ?  ? ?BP 136/89   Pulse 95   Ht '5\' 5"'$  (1.651 m)   Wt 229 lb 12.8 oz (104.2 kg)   BMI 38.24 kg/m?  ? ? ?Physical Exam ?Vitals reviewed.  ?Constitutional:   ?   Appearance: Normal appearance. She is obese.  ?HENT:  ?   Head: Normocephalic and atraumatic.  ?   Comments: Surgical scar noted, no significant edema or ecchymosis ?Cardiovascular:  ?   Rate and Rhythm: Normal rate and regular rhythm.  ?Pulmonary:  ?   Effort: Pulmonary effort is normal.  ?   Breath sounds: Normal breath sounds.  ?Musculoskeletal:  ?   Cervical back: Neck supple. No tenderness.  ?Lymphadenopathy:  ?   Cervical: No cervical adenopathy.  ?Skin: ?   General: Skin is warm and dry.  ?Neurological:  ?   General: No focal deficit present.  ?   Mental Status: She is alert and oriented to person, place, and time. Mental status is at baseline.  ?Psychiatric:     ?   Mood and Affect: Mood normal.     ?   Behavior: Behavior normal.     ?   Thought Content: Thought content normal.     ?   Judgment: Judgment normal.  ? ? ? ?  No results found for any visits on 11/01/21. ? ? ? ?The 10-year ASCVD risk score (Arnett DK, et al., 2019) is: 2.6% ? ?  ?Assessment & Plan:  ? ? ?Diabetes mellitus without complication ([place]) ?Controlled with last A1c 7.6% last month ?Continue current medications: metformin 1,000 mg BID ?Discussed diet and exercise ?F/u in July with PCP as scheduled  ? ? ?ADD (attention deficit disorder) ?Stable. She has been needing her Adderall again now that she is back at work after being off for awhile related to recent surgery. She is requesting refill. PDMP reviewed.  ? ?Hyperlipidemia ?-Reviewed most recent lipid panel (last month) ?-Medication management: continue atorvastatin 10 mg daily  ?-Diet low in saturated fat ?-Regular exercise - at least 30 minutes, 5 times per week ? ?Acquired  hypothyroidism ?Labs last month indicated need to increase levothyroxine to 125 mcg daily. She is tolerating well - continue current regimen. Repeat labs due in mid-June  -schedule lab appointment.  ? ?Viral URI  ?For your upper respiratory symptoms - continue with allergy medications, Mucinex, and supportive measures including rest, hydration, humidifier use, steam showers, warm compresses to sinuses, warm liquids with lemon and honey, and over-the-counter cough, cold, and analgesics as needed.  If not improving by day 8-10 or if symptoms worsen before then, please let us know.  ? ? ?No orders of the defined types were placed in this encounter. ? ?Meds ordered this encounter  ?Medications  ? amphetamine-dextroamphetamine (ADDERALL) 10 MG tablet  ?  Sig: Take 1 tablet (10 mg total) by mouth daily as needed (focus).  ?  Dispense:  30 tablet  ?  Refill:  0  ?  Order Specific Question:   Supervising Provider  ?  Answer:   Penni Homans A [[id number]]  ? ? ? ?Return for as scheduled or sooner if needed.  ? ? ?Terrilyn Saver, NP ? ?

## 2021-11-01 NOTE — Assessment & Plan Note (Signed)
Stable. She has been needing her Adderall again now that she is back at work after being off for awhile related to recent surgery. She is requesting refill. PDMP reviewed.  ?

## 2021-11-01 NOTE — Assessment & Plan Note (Signed)
-  Reviewed most recent lipid panel (last month) ?-Medication management: continue atorvastatin 10 mg daily  ?-Diet low in saturated fat ?-Regular exercise - at least 30 minutes, 5 times per week ? ?

## 2021-11-01 NOTE — Patient Instructions (Addendum)
Glad you are doing well since surgery! ?Keep all upcoming appointments.  ? ?For your upper respiratory symptoms - continue with allergy medications, Mucinex, and supportive measures including rest, hydration, humidifier use, steam showers, warm compresses to sinuses, warm liquids with lemon and honey, and over-the-counter cough, cold, and analgesics as needed.  If not improving by day 8-10 or if symptoms worsen before then, please let us know.  ?

## 2021-11-01 NOTE — Assessment & Plan Note (Signed)
Controlled with last A1c 7.6% last month ?Continue current medications: metformin 1,000 mg BID ?Discussed diet and exercise ?F/u in July with PCP as scheduled  ? ?

## 2021-11-16 ENCOUNTER — Other Ambulatory Visit: Payer: Self-pay | Admitting: Family Medicine

## 2021-11-25 ENCOUNTER — Other Ambulatory Visit: Payer: Managed Care, Other (non HMO)

## 2021-11-26 ENCOUNTER — Other Ambulatory Visit: Payer: Managed Care, Other (non HMO)

## 2021-12-01 ENCOUNTER — Other Ambulatory Visit (INDEPENDENT_AMBULATORY_CARE_PROVIDER_SITE_OTHER): Payer: Managed Care, Other (non HMO)

## 2021-12-01 DIAGNOSIS — E079 Disorder of thyroid, unspecified: Secondary | ICD-10-CM | POA: Diagnosis not present

## 2021-12-01 LAB — TSH: TSH: 22.76 u[IU]/mL — ABNORMAL HIGH (ref 0.35–5.50)

## 2021-12-01 MED ORDER — LEVOTHYROXINE SODIUM 125 MCG PO TABS: 125.0000 ug | ORAL_TABLET | Freq: Every day | ORAL | 3 refills | Status: DC

## 2021-12-01 NOTE — Addendum Note (Signed)
Addended by: Lynnea Ferrier R on: 12/01/2021 03:10 PM   Modules accepted: Orders

## 2021-12-22 NOTE — Progress Notes (Signed)
Subjective:    Patient ID: Kristina Watts, female    DOB: June 10, 1971, 51 y.o.   MRN: [id number]  Chief Complaint  Patient presents with   Follow-up    HPI Patient is in today for a follow up on chronic medical concerns. She has been slowly recovering after her thyroidectomy for her thyroid cancer. 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 She denies any obvious symptoms of reflux. No recent febrile illness or acute hospitalizations. Denies CP/palp/SOB/HA/congestion/fevers/GI or GU c/o. Taking meds as prescribed. She had one mild flare in her asthma but it has settled down now.   Past Medical History:  Diagnosis Date   Acute bronchitis 11/19/2012   ADD (attention deficit disorder) 04/27/2012   Allergy    seasonal   Asthma    Back pain 11/07/2013   Cervical cancer screening 12/23/2014   Chicken pox as a child   Depression with anxiety 04/18/2011   Dermatitis 05/18/2011   Diabetes mellitus without complication ([place])    History of gestational diabetes mellitus (GDM) 04/19/2011   History of pancreatitis 04/19/2011   Hyperlipidemia 04/27/2012   LUQ pain 12/12/2011   Migraine 08/23/2011   Otitis media of right ear 05/30/2011   Overweight(278.02) 04/19/2011   Pancreatitis, recurrent 12/12/2011   Pharyngitis 08/23/2011   Pneumonia 1998   Preventative health care 04/19/2011   Recurrent HSV (herpes simplex virus) 04/18/2011   Reflux 12/12/2011   Tinea corporis 05/18/2011    Past Surgical History:  Procedure Laterality Date   APPENDECTOMY  2007   CESAREAN SECTION  1-02 and 4-04   CHOLECYSTECTOMY  2000   THYROIDECTOMY N/A 08/27/2021   Procedure: TOTAL THYROIDECTOMY;  Surgeon: Kieth Brightly, Arta Bruce, MD;  Location: [facility];  Service: General;  Laterality: N/A;    Family History  Problem Relation Age of Onset   Thyroid disease Mother     Hypertension Father    Hyperlipidemia Father    Heart disease Father    Diabetes Father    COPD Father        previous smoker   Stroke Father    Depression Father    Restless legs syndrome Father    Diabetes Brother    Cancer Maternal Grandmother        Head and Neck   Heart disease Paternal Grandmother    Hyperlipidemia Paternal Grandmother    Hypertension Paternal Grandmother    Diabetes Paternal Grandmother    Depression Paternal Grandmother    Stroke Paternal Grandmother    Restless legs syndrome Paternal Grandmother    Heart disease Paternal Grandfather    Hyperlipidemia Paternal Grandfather    Hypertension Paternal Grandfather    Stroke Paternal Grandfather    Diabetes Paternal Grandfather     Social History   Socioeconomic History   Marital status: Married    Spouse name: Not on file   Number of children: Not on file   Years of education: Not on file   Highest education level: Not on file  Occupational History   Not on file  Tobacco Use   Smoking status: Never   Smokeless tobacco: Never  Vaping Use   Vaping Use: Never used  Substance and Sexual Activity   Alcohol use: Yes    Comment: 1/week   Drug use: No   Sexual activity: Yes    Partners: Male  Other Topics Concern  Not on file  Social History Narrative   Not on file   Social Determinants of Health   Financial Resource Strain: Not on file  Food Insecurity: Not on file  Transportation Needs: Not on file  Physical Activity: Not on file  Stress: Not on file  Social Connections: Not on file  Intimate Partner Violence: Not on file    Outpatient Medications Prior to Visit  Medication Sig Dispense Refill   acetaminophen (TYLENOL) 500 MG tablet Take 1,000 mg by mouth every 6 (six) hours as needed for moderate pain.     atorvastatin (LIPITOR) 10 MG tablet TAKE 1 TABLET BY MOUTH EVERY DAY 90 tablet 1   Blood Glucose Monitoring Suppl (ONETOUCH VERIO REFLECT) w/Device KIT USE TO CHECK SUGAR ONCE A DAY  AND AS NEEDED. DX CODE: E11.9 1 kit 0   buPROPion (WELLBUTRIN XL) 150 MG 24 hr tablet TAKE 1 TABLET BY MOUTH EVERY DAY 90 tablet 1   busPIRone (BUSPAR) 5 MG tablet Take 1 tablet (5 mg total) by mouth 2 (two) times daily as needed. 40 tablet 1   Butalbital-Acetaminophen (BUTALBITAL-APAP) 50-325 MG TABS Take 1 tablet by mouth 3 (three) times daily as needed. 60 each 0   famotidine (PEPCID) 40 MG tablet TAKE 1 TABLET BY MOUTH EVERY DAY 90 tablet 1   ibuprofen (ADVIL,MOTRIN) 200 MG tablet Take 400 mg by mouth every 6 (six) hours as needed for headache or moderate pain.     ketoconazole (NIZORAL) 2 % cream Apply 1 application topically daily. (Patient taking differently: Apply 1 application  topically daily as needed for irritation.) 30 g 1   Lancets (ONETOUCH DELICA PLUS [id number]) MISC Use to check sugar once a day and as needed.  Dx code: E11.9 100 each 1   levocetirizine (XYZAL) 5 MG tablet Take 5 mg by mouth every evening.     levothyroxine (SYNTHROID) 125 MCG tablet Take 1 tablet (125 mcg total) by mouth daily before breakfast. (Patient taking differently: Take 150 mcg by mouth daily before breakfast.) 30 tablet 3   melatonin 3 MG TABS tablet Take 3 mg by mouth at bedtime.     metFORMIN (GLUCOPHAGE) 1000 MG tablet TAKE 1 TABLET (1,000 MG TOTAL) BY MOUTH TWICE A DAY WITH FOOD 180 tablet 1   montelukast (SINGULAIR) 10 MG tablet Take 1 tablet (10 mg total) by mouth at bedtime. (Patient taking differently: Take 10 mg by mouth at bedtime as needed (allergies in spring).) 90 tablet 0   Multiple Vitamin (MULTIVITAMIN) tablet Take 1 tablet by mouth daily.     ONETOUCH VERIO test strip USE TO CHECK SUGAR TWICE A DAY AND AS NEEDED. DX CODE: E11.65 100 strip 12   promethazine (PHENERGAN) 25 MG tablet Take 1 tablet (25 mg total) by mouth every 8 (eight) hours as needed for nausea or vomiting. 30 tablet 1   SYMBICORT 160-4.5 MCG/ACT inhaler TAKE 2 PUFFS BY MOUTH TWICE A DAY (Patient taking differently: Inhale 2  puffs into the lungs 2 (two) times daily as needed (shortness of breath or wheezing in the spring).) 10.2 g 11   valACYclovir (VALTREX) 1000 MG tablet TAKE 1 TABLET BY MOUTH TWICE A DAY 180 tablet 1   venlafaxine XR (EFFEXOR-XR) 75 MG 24 hr capsule TAKE 1 CAPSULE (75 MG TOTAL) BY MOUTH 2 (TWO) TIMES DAILY WITH BREAKFAST AND LUNCH. 180 capsule 1   amphetamine-dextroamphetamine (ADDERALL) 10 MG tablet Take 1 tablet (10 mg total) by mouth daily as needed (focus). 30 tablet 0  No facility-administered medications prior to visit.    Allergies  Allergen Reactions   Actifed [Triprolidine-Pse] Hives    Review of Systems  Constitutional:  Positive for malaise/fatigue. Negative for fever.  HENT:  Positive for sore throat. Negative for congestion.   Eyes:  Negative for blurred vision.  Respiratory:  Negative for shortness of breath.   Cardiovascular:  Negative for chest pain, palpitations and leg swelling.  Gastrointestinal:  Negative for abdominal pain, blood in stool and nausea.  Genitourinary:  Negative for dysuria and frequency.  Musculoskeletal:  Negative for falls.  Skin:  Negative for rash.  Neurological:  Negative for dizziness, loss of consciousness and headaches.  Endo/Heme/Allergies:  Negative for environmental allergies.  Psychiatric/Behavioral:  Negative for depression. The patient is not nervous/anxious.        Objective:    Physical Exam Constitutional:      General: She is not in acute distress.    Appearance: She is well-developed.  HENT:     Head: Normocephalic and atraumatic.     Mouth/Throat:     Pharynx: Posterior oropharyngeal erythema present.     Comments: Along tonsillar pillars Eyes:     Conjunctiva/sclera: Conjunctivae normal.  Neck:     Thyroid: No thyromegaly.  Cardiovascular:     Rate and Rhythm: Normal rate and regular rhythm.     Heart sounds: Normal heart sounds. No murmur heard. Pulmonary:     Effort: Pulmonary effort is normal. No respiratory  distress.     Breath sounds: Normal breath sounds.  Abdominal:     General: Bowel sounds are normal. There is no distension.     Palpations: Abdomen is soft. There is no mass.     Tenderness: There is no abdominal tenderness.  Musculoskeletal:     Cervical back: Neck supple.  Lymphadenopathy:     Cervical: No cervical adenopathy.  Skin:    General: Skin is warm and dry.  Neurological:     Mental Status: She is alert and oriented to person, place, and time.  Psychiatric:        Behavior: Behavior normal.     BP 118/80 (BP Location: Left Arm, Patient Position: Sitting, Cuff Size: Large)   Pulse 86   Resp 20   Ht _0  (1.651 m)   Wt 229 lb (103.9 kg)   SpO2 98%   BMI 38.11 kg/m  Wt Readings from Last 3 Encounters:  12/23/21 229 lb (103.9 kg)  11/01/21 229 lb 12.8 oz (104.2 kg)  08/27/21 225 lb 8.5 oz (102.3 kg)    Diabetic Foot Exam - Simple   No data filed    Lab Results  Component Value Date   WBC 8.4 10/04/2021   HGB 11.9 (L) 10/04/2021   HCT 34.7 (L) 10/04/2021   PLT 324.0 10/04/2021   GLUCOSE 143 (H) 10/04/2021   CHOL 176 10/04/2021   TRIG 241.0 (H) 10/04/2021   HDL 43.50 10/04/2021   LDLDIRECT 113.0 10/04/2021   LDLCALC 60 05/11/2020   ALT 37 (H) 10/04/2021   AST 22 10/04/2021   NA 137 10/04/2021   K 3.9 10/04/2021   CL 101 10/04/2021   CREATININE 0.81 10/04/2021   BUN 11 10/04/2021   CO2 27 10/04/2021   TSH 22.76 (H) 12/01/2021   HGBA1C 7.6 (H) 10/04/2021    Lab Results  Component Value Date   TSH 22.76 (H) 12/01/2021   Lab Results  Component Value Date   WBC 8.4 10/04/2021   HGB 11.9 (L) 10/04/2021  HCT 34.7 (L) 10/04/2021   MCV 87.5 10/04/2021   PLT 324.0 10/04/2021   Lab Results  Component Value Date   NA 137 10/04/2021   K 3.9 10/04/2021   CO2 27 10/04/2021   GLUCOSE 143 (H) 10/04/2021   BUN 11 10/04/2021   CREATININE 0.81 10/04/2021   BILITOT 0.4 10/04/2021   ALKPHOS 67 10/04/2021   AST 22 10/04/2021   ALT 37 (H)  10/04/2021   PROT 6.9 10/04/2021   ALBUMIN 4.5 10/04/2021   CALCIUM 9.0 10/04/2021   ANIONGAP 8 08/28/2021   GFR 85.76 10/04/2021   Lab Results  Component Value Date   CHOL 176 10/04/2021   Lab Results  Component Value Date   HDL 43.50 10/04/2021   Lab Results  Component Value Date   LDLCALC 60 05/11/2020   Lab Results  Component Value Date   TRIG 241.0 (H) 10/04/2021   Lab Results  Component Value Date   CHOLHDL 4 10/04/2021   Lab Results  Component Value Date   HGBA1C 7.6 (H) 10/04/2021       Assessment & Plan:      Problem List Items Addressed This Visit     Pharyngitis    Has noted persistent trouble with her voice, hoarseness and pain most notably on the right side and worse with swallowing food.       Relevant Orders   Ambulatory referral to ENT   Ambulatory referral to Gastroenterology   GERD (gastroesophageal reflux disease)    Avoid offending foods, start probiotics. Do not eat large meals in late evening and consider raising head of bed. Continue Famotidine and add Omeprazole      Relevant Medications   omeprazole (PRILOSEC) 40 MG capsule   Other Relevant Orders   Ambulatory referral to Gastroenterology   Hyperlipidemia    Encourage heart healthy diet such as MIND or DASH diet, increase exercise, avoid trans fats, simple carbohydrates and processed foods, consider a krill or fish or flaxseed oil cap daily. Tolerating Atorvastatin      ADD (attention deficit disorder)    Stable on current meds, no changes      Relevant Medications   amphetamine-dextroamphetamine (ADDERALL) 10 MG tablet   amphetamine-dextroamphetamine (ADDERALL) 10 MG tablet   Diabetes mellitus without complication (HCC)    IPJA2N acceptable, minimize simple carbs. Increase exercise as tolerated. Continue current meds      Hx of papillary thyroid carcinoma - Primary    She underwent surgical resection and has healed well at her surgical site      Dysphagia    She  continues to note pain and some trouble with swallowing. Will add Omeprazole 40 mg in am to her Famotidine 40 mg in pm and she is referred to gastroenterology for further evaluation      Relevant Orders   Ambulatory referral to Gastroenterology    I have changed Jalan A. Rubis "Ann"'s amphetamine-dextroamphetamine. I am also having her start on omeprazole, amphetamine-dextroamphetamine, amphetamine-dextroamphetamine, and rOPINIRole. Additionally, I am having her maintain her multivitamin, acetaminophen, ibuprofen, montelukast, levocetirizine, Symbicort, promethazine, Butalbital-APAP, ketoconazole, OneTouch Delica Plus [id number], OneTouch Verio Reflect, busPIRone, venlafaxine XR, atorvastatin, melatonin, famotidine, buPROPion, valACYclovir, OneTouch Verio, levothyroxine, and metFORMIN.  Meds ordered this encounter  Medications   omeprazole (PRILOSEC) 40 MG capsule    Sig: Take 1 capsule (40 mg total) by mouth daily.    Dispense:  30 capsule    Refill:  3   amphetamine-dextroamphetamine (ADDERALL) 10 MG tablet    Sig: Take  1 tablet (10 mg total) by mouth daily as needed (focus). July 2023    Dispense:  30 tablet    Refill:  0   amphetamine-dextroamphetamine (ADDERALL) 10 MG tablet    Sig: Take 1 tablet (10 mg total) by mouth daily as needed. August 2023    Dispense:  30 tablet    Refill:  0   amphetamine-dextroamphetamine (ADDERALL) 10 MG tablet    Sig: Take 1 tablet (10 mg total) by mouth daily as needed. September 2023    Dispense:  30 tablet    Refill:  0   rOPINIRole (REQUIP) 0.25 MG tablet    Sig: Take 1 tablet (0.25 mg total) by mouth at bedtime.    Dispense:  30 tablet    Refill:  3

## 2021-12-23 ENCOUNTER — Other Ambulatory Visit: Payer: Self-pay | Admitting: Family Medicine

## 2021-12-23 ENCOUNTER — Encounter: Payer: Self-pay | Admitting: Family Medicine

## 2021-12-23 ENCOUNTER — Ambulatory Visit (INDEPENDENT_AMBULATORY_CARE_PROVIDER_SITE_OTHER): Payer: Managed Care, Other (non HMO) | Admitting: Family Medicine

## 2021-12-23 VITALS — BP 118/80 | HR 86 | Resp 20 | Ht 65.0 in | Wt 229.0 lb

## 2021-12-23 DIAGNOSIS — J029 Acute pharyngitis, unspecified: Secondary | ICD-10-CM

## 2021-12-23 DIAGNOSIS — E079 Disorder of thyroid, unspecified: Secondary | ICD-10-CM

## 2021-12-23 DIAGNOSIS — F988 Other specified behavioral and emotional disorders with onset usually occurring in childhood and adolescence: Secondary | ICD-10-CM

## 2021-12-23 DIAGNOSIS — R1310 Dysphagia, unspecified: Secondary | ICD-10-CM | POA: Diagnosis not present

## 2021-12-23 DIAGNOSIS — Z85850 Personal history of malignant neoplasm of thyroid: Secondary | ICD-10-CM

## 2021-12-23 DIAGNOSIS — K219 Gastro-esophageal reflux disease without esophagitis: Secondary | ICD-10-CM

## 2021-12-23 DIAGNOSIS — E785 Hyperlipidemia, unspecified: Secondary | ICD-10-CM

## 2021-12-23 DIAGNOSIS — E119 Type 2 diabetes mellitus without complications: Secondary | ICD-10-CM

## 2021-12-23 MED ORDER — AMPHETAMINE-DEXTROAMPHETAMINE 10 MG PO TABS: 10.0000 mg | ORAL_TABLET | Freq: Every day | ORAL | 0 refills | Status: DC | PRN

## 2021-12-23 MED ORDER — ROPINIROLE HCL 0.25 MG PO TABS: 0.2500 mg | ORAL_TABLET | Freq: Every day | ORAL | 3 refills | Status: DC

## 2021-12-23 MED ORDER — OMEPRAZOLE 40 MG PO CPDR: 40.0000 mg | DELAYED_RELEASE_CAPSULE | Freq: Every day | ORAL | 3 refills | Status: DC

## 2021-12-23 NOTE — Patient Instructions (Signed)

## 2021-12-24 DIAGNOSIS — R1310 Dysphagia, unspecified: Secondary | ICD-10-CM | POA: Insufficient documentation

## 2021-12-24 NOTE — Assessment & Plan Note (Signed)
Stable on current meds, no changes. 

## 2021-12-24 NOTE — Assessment & Plan Note (Signed)
Avoid offending foods, start probiotics. Do not eat large meals in late evening and consider raising head of bed. Continue Famotidine and add Omeprazole

## 2021-12-24 NOTE — Assessment & Plan Note (Signed)
Has noted persistent trouble with her voice, hoarseness and pain most notably on the right side and worse with swallowing food.

## 2021-12-24 NOTE — Assessment & Plan Note (Signed)
hgba1c acceptable, minimize simple carbs. Increase exercise as tolerated. Continue current meds 

## 2021-12-24 NOTE — Assessment & Plan Note (Signed)
She continues to note pain and some trouble with swallowing. Will add Omeprazole 40 mg in am to her Famotidine 40 mg in pm and she is referred to gastroenterology for further evaluation

## 2021-12-24 NOTE — Assessment & Plan Note (Signed)
She underwent surgical resection and has healed well at her surgical site

## 2021-12-24 NOTE — Assessment & Plan Note (Signed)
Encourage heart healthy diet such as MIND or DASH diet, increase exercise, avoid trans fats, simple carbohydrates and processed foods, consider a krill or fish or flaxseed oil cap daily. Tolerating Atorvastatin 

## 2022-01-04 ENCOUNTER — Other Ambulatory Visit: Payer: Self-pay | Admitting: Family Medicine

## 2022-01-06 ENCOUNTER — Other Ambulatory Visit: Payer: Self-pay | Admitting: *Deleted

## 2022-01-06 DIAGNOSIS — E039 Hypothyroidism, unspecified: Secondary | ICD-10-CM

## 2022-01-12 ENCOUNTER — Other Ambulatory Visit: Payer: Self-pay

## 2022-01-12 ENCOUNTER — Other Ambulatory Visit (INDEPENDENT_AMBULATORY_CARE_PROVIDER_SITE_OTHER): Payer: Managed Care, Other (non HMO)

## 2022-01-12 ENCOUNTER — Other Ambulatory Visit: Payer: Self-pay | Admitting: Family Medicine

## 2022-01-12 ENCOUNTER — Encounter: Payer: Self-pay | Admitting: Family Medicine

## 2022-01-12 DIAGNOSIS — E039 Hypothyroidism, unspecified: Secondary | ICD-10-CM | POA: Diagnosis not present

## 2022-01-12 LAB — TSH: TSH: 22.48 u[IU]/mL — ABNORMAL HIGH (ref 0.35–5.50)

## 2022-01-12 MED ORDER — LEVOTHYROXINE SODIUM 175 MCG PO TABS: 175.0000 ug | ORAL_TABLET | Freq: Every day | ORAL | 3 refills | Status: DC

## 2022-01-12 MED ORDER — LEVOTHYROXINE SODIUM 150 MCG PO TABS: 150.0000 ug | ORAL_TABLET | Freq: Every day | ORAL | 3 refills | Status: DC

## 2022-01-16 ENCOUNTER — Other Ambulatory Visit: Payer: Self-pay | Admitting: Family Medicine

## 2022-02-02 ENCOUNTER — Other Ambulatory Visit: Payer: Self-pay | Admitting: Family Medicine

## 2022-02-18 ENCOUNTER — Encounter: Payer: Self-pay | Admitting: Gastroenterology

## 2022-03-21 ENCOUNTER — Other Ambulatory Visit: Payer: Self-pay | Admitting: Family Medicine

## 2022-03-23 ENCOUNTER — Ambulatory Visit (INDEPENDENT_AMBULATORY_CARE_PROVIDER_SITE_OTHER): Payer: Managed Care, Other (non HMO) | Admitting: Gastroenterology

## 2022-03-23 ENCOUNTER — Encounter: Payer: Self-pay | Admitting: Gastroenterology

## 2022-03-23 VITALS — BP 118/72 | HR 90 | Ht 65.0 in | Wt 230.1 lb

## 2022-03-23 DIAGNOSIS — Z1212 Encounter for screening for malignant neoplasm of rectum: Secondary | ICD-10-CM

## 2022-03-23 DIAGNOSIS — J029 Acute pharyngitis, unspecified: Secondary | ICD-10-CM

## 2022-03-23 DIAGNOSIS — R490 Dysphonia: Secondary | ICD-10-CM | POA: Diagnosis not present

## 2022-03-23 DIAGNOSIS — R1310 Dysphagia, unspecified: Secondary | ICD-10-CM

## 2022-03-23 DIAGNOSIS — Z1211 Encounter for screening for malignant neoplasm of colon: Secondary | ICD-10-CM | POA: Diagnosis not present

## 2022-03-23 IMAGING — MG MM DIGITAL SCREENING BILAT W/ TOMO AND CAD
8 series · 8 of 24 positions shown · non-contrast
Comparison: Previous exam(s).

ACR Breast Density Category a: The breast tissue is almost entirely
fatty.

CLINICAL DATA: Screening.

EXAM:
DIGITAL SCREENING BILATERAL MAMMOGRAM WITH TOMOSYNTHESIS AND CAD
TECHNIQUE: Bilateral screening digital craniocaudal and mediolateral oblique
mammograms were obtained. Bilateral screening digital breast
tomosynthesis was performed. The images were evaluated with
computer-aided detection.

[L MLO synth-2D]
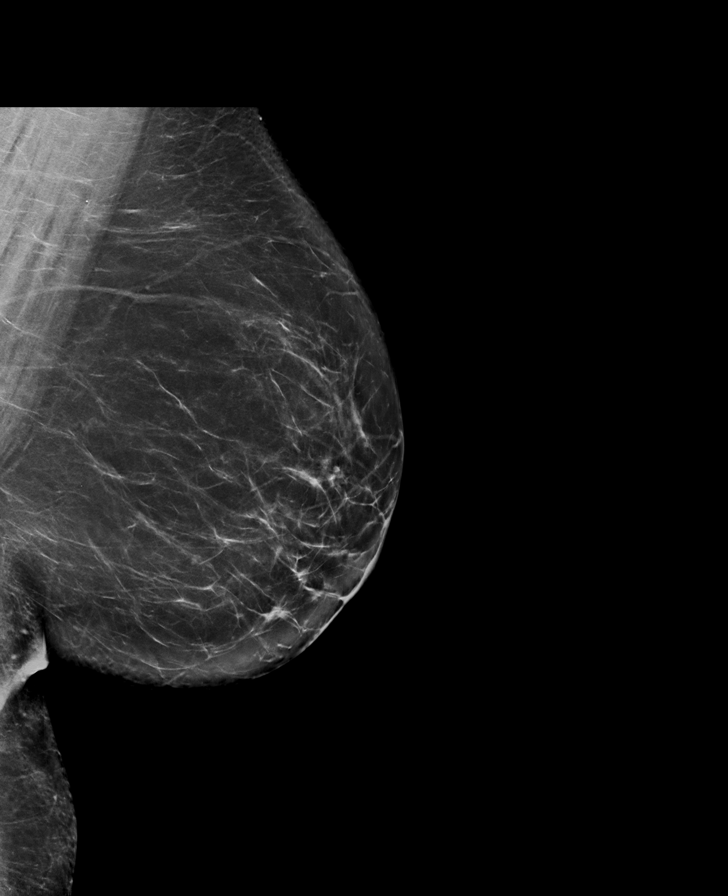

[L CC synth-2D]
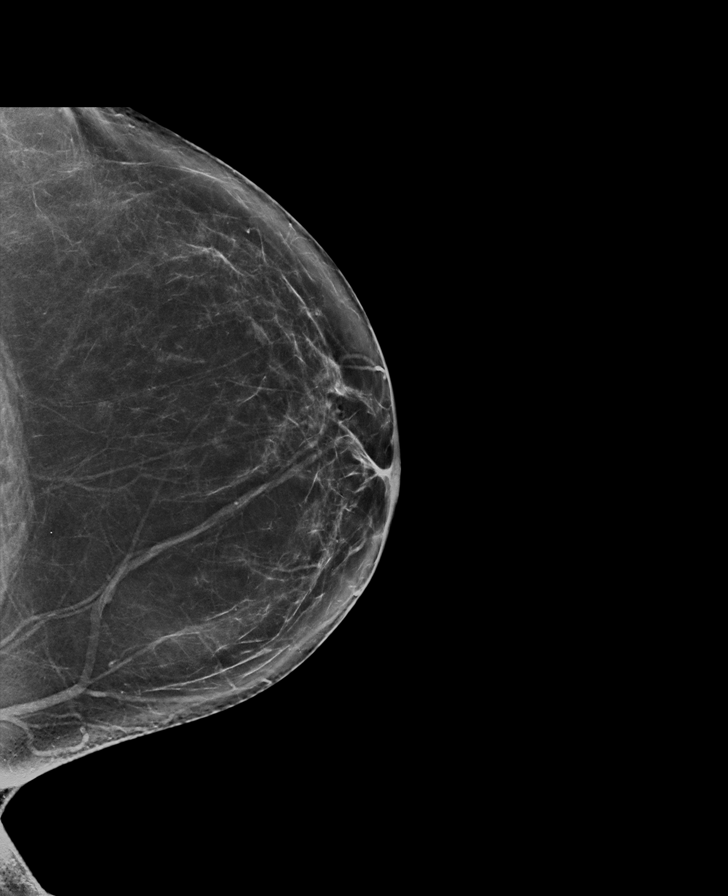

[R CC synth-2D]
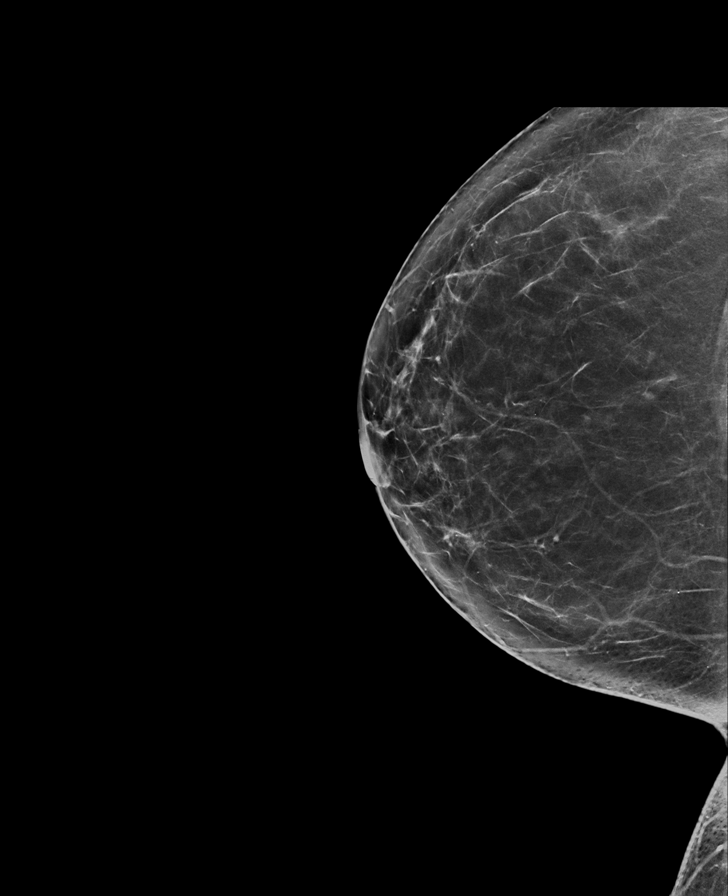

[R MLO synth-2D]
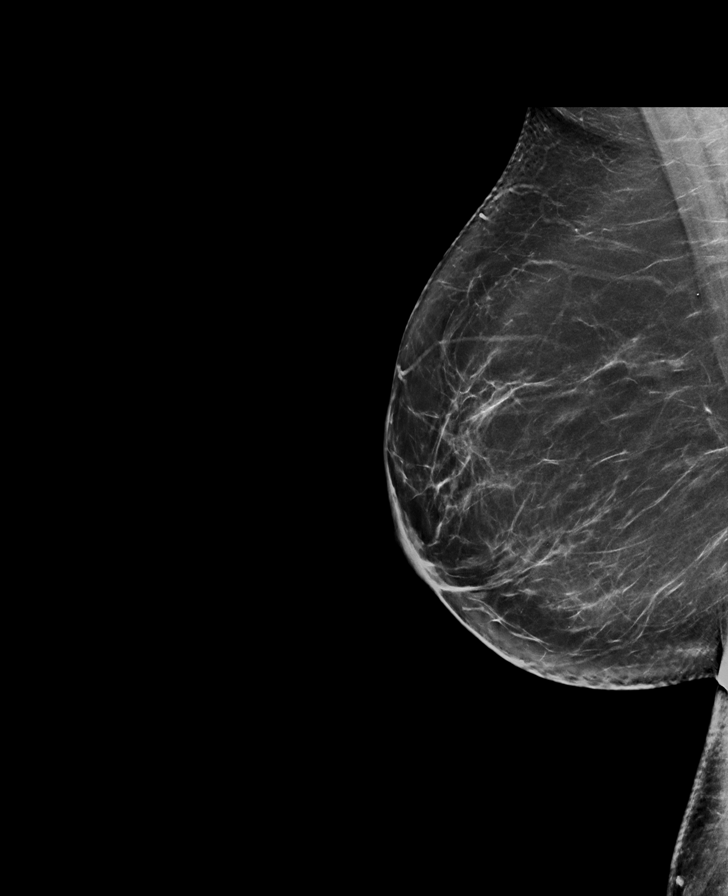

[L MLO tomo · tomo slice 44/87.0]
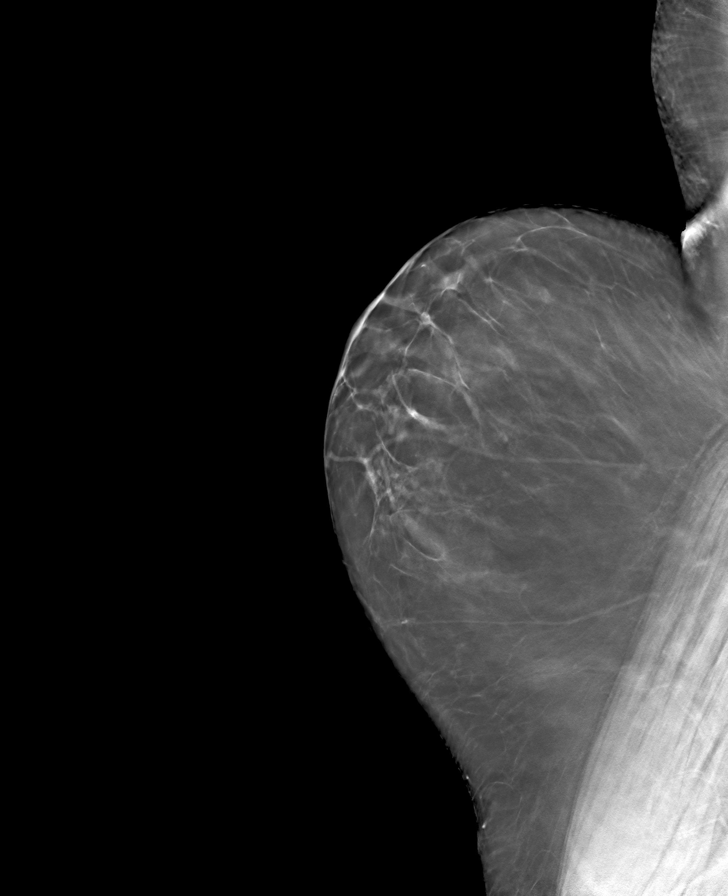

[L CC tomo · tomo slice 41/80.0]
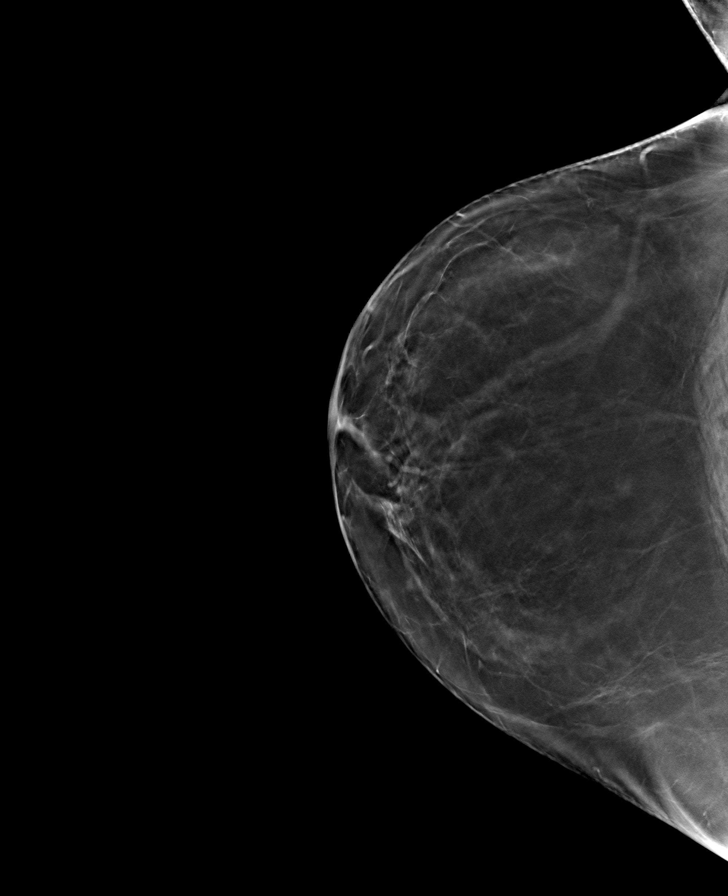

[R CC tomo · tomo slice 37/74.0]
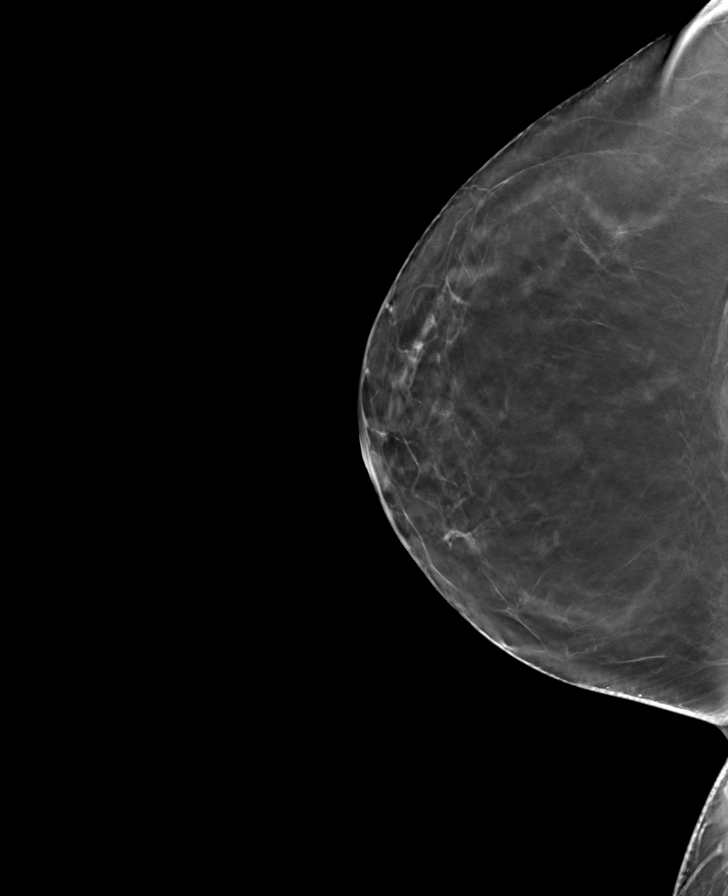

[R MLO tomo · tomo slice 41/81.0]
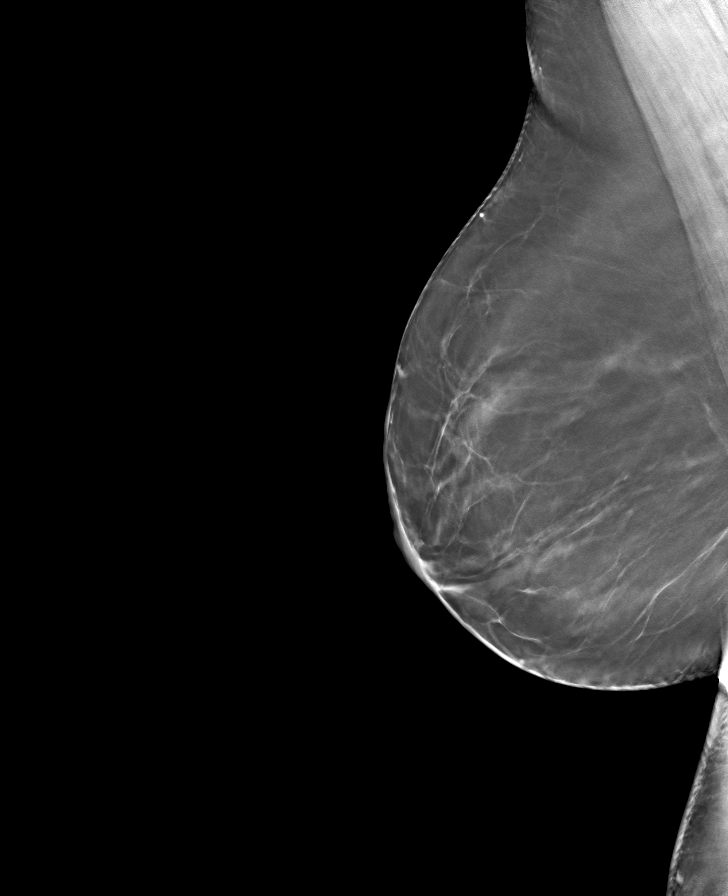

[8 of 24 positions shown; findings below may reference images not displayed]

FINDINGS: There are no findings suspicious for malignancy.
IMPRESSION: No mammographic evidence of malignancy. A result letter of this
screening mammogram will be mailed directly to the patient.

RECOMMENDATION:
Screening mammogram in one year. (Code:0E-3-N98)

BI-RADS CATEGORY  1: Negative.

## 2022-03-23 MED ORDER — NA SULFATE-K SULFATE-MG SULF 17.5-3.13-1.6 GM/177ML PO SOLN: 1.0000 | Freq: Once | ORAL | 0 refills | Status: AC

## 2022-03-23 MED ORDER — OMEPRAZOLE 40 MG PO CPDR
40.0000 mg | DELAYED_RELEASE_CAPSULE | Freq: Two times a day (BID) | ORAL | 2 refills | Status: DC
Start: 2022-03-23 — End: 2022-07-22

## 2022-03-23 NOTE — Patient Instructions (Addendum)
_______________________________________________________  If you are age 49 or older, your body mass index should be between 23-30. Your Body mass index is 38.29 kg/m. If this is out of the aforementioned range listed, please consider follow up with your Primary Care Provider.  If you are age 38 or younger, your body mass index should be between 19-25. Your Body mass index is 38.29 kg/m. If this is out of the aformentioned range listed, please consider follow up with your Primary Care Provider.   ________________________________________________________  The [facility] providers would like to encourage you to use [facility] to communicate with providers for non-urgent requests or questions.  Due to long hold times on the telephone, sending your provider a message by [facility] may be a faster and more efficient way to get a response.  Please allow 48 business hours for a response.  Please remember that this is for non-urgent requests.  _______________________________________________________  Dennis Bast have been scheduled for an endoscopy and colonoscopy. Please follow the written instructions given to you at your visit today. Please pick up your prep supplies at the pharmacy within the next 1-3 days. If you use inhalers (even only as needed), please bring them with you on the day of your procedure.  We have sent the following medications to your pharmacy for you to pick up at your convenience: [place] (Metformin, Glipizide, Glimepiride, Farxiga, [place], [place]belsus, [place], [place], [place])   The day before your procedure:  Take your diabetic pill as you do normally  The day of your procedure:  Do not take your diabetic pill   We will check your blood sugar levels during the admission process and again in Recovery before discharging you home  _____________________________________________________________________________  INSULIN (LONG ACTING)  MEDICATION INSTRUCTIONS (Lantus, Levemir, Basaglar, NPH, 70/30, Humulin, Novolin-N, [place]o,Tresiba, [place], [place])  The day before your procedure:  Take  your regular daily dose   The day of your procedure:  Do not take your morning dose We will check your blood sugar levels during the admission process and again in Recovery before discharging you home    INSULIN (SHORT ACTING) MEDICATION INSTRUCTIONS (Regular, Humulog, Novolog,Novolin-R)  The day before your procedure:  Do not take your evening dose  The day of your procedure:  Do not take your morning dose We will check your blood sugar levels during the admission process and again in Recovery before discharging you home     INSULIN PUMP MEDICATION INSTRUCTIONS  We will contact the physician managing your diabetic care for written dosage instructions for the day before your procedure and the day of your procedure.  Once we have received the instructions, we will contact you. Call our office 1 week prior to your exam if you have not heard from [place]. ____________________________________________________________________________  ONCE A WEEK INJECTIONS Trulicity/ Ozempic/ Mounjaro/ Wegovy - DO NOT TAKE three days prior to the procedure  _____________________________________________________________________________  ONCE A DAY INJECTIONS Byetta,Victoza,Saxenda,Adlyxin- DO NOT TAKE 2 days prior to the procedure    It was a pleasure to see you today!  Thank you for trusting me with your gastrointestinal care!

## 2022-03-23 NOTE — Progress Notes (Signed)
03/23/2022 Kristina Watts [id number] December 28, 1970   HISTORY OF PRESENT ILLNESS: This is a 51 year old female who is new to our office.  Has been referred here by her PCP, Dr. Charlett Blake, for evaluation regarding GERD, pharyngitis, dysphagia.  She has also never had a colonoscopy performed.  The patient tells me that she has been having these throat issues for over a year now.  She says that she always feels like she has a sore throat.  Her voice is always very hoarse.  She gets episodes at least once a day where she will get food caught high in her throat and have to kind of cough to bring it out.  She does not feel any overt heartburn or reflux symptoms.  She had previously been on famotidine daily, but was placed on omeprazole 40 mg daily several months ago.  Has maybe noticed some difference in the sore throat, but not completely resolved and still has the other issues.  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  She also has an appointment with ENT next week.  Everyone keeps telling her to reflux related.  She denies any issues with her bowel habits.  She says that she does fiber supplements and probiotics and that has regulated her bowels well.  She denies any rectal bleeding.  Of note, she had a history of pancreatitis that sounds like it was gallbladder related.  She has not had any issues with that in at least 16 years, however, it was recurrent during a period of time.   Past Medical History:  Diagnosis Date   Acute bronchitis 11/19/2012   ADD (attention deficit disorder) 04/27/2012   Allergy    seasonal   Asthma    Back pain 11/07/2013   Cervical cancer screening 12/23/2014   Chicken pox as a child   Depression with anxiety 04/18/2011   Dermatitis 05/18/2011   Diabetes mellitus without complication ([place])    History of gestational diabetes mellitus (GDM) 04/19/2011   History of pancreatitis  04/19/2011   Hyperlipidemia 04/27/2012   LUQ pain 12/12/2011   Migraine 08/23/2011   Otitis media of right ear 05/30/2011   Overweight(278.02) 04/19/2011   Pancreatitis, recurrent 12/12/2011   Pharyngitis 08/23/2011   Pneumonia 1998   Preventative health care 04/19/2011   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 Was done due to pancreatitis   THYROIDECTOMY N/A 08/27/2021   Procedure: TOTAL THYROIDECTOMY;  Surgeon: Kinsinger, Arta Bruce, MD;  Location: [facility];  Service: General;  Laterality: N/A;    reports that she has never smoked. She has never used smokeless tobacco. She reports current alcohol use. She reports that she does not use drugs. family history includes COPD in her father; Cancer in her maternal grandmother; Colon polyps in her father and mother; Depression in her father and paternal grandmother; Diabetes in her brother, father, paternal grandfather, and paternal grandmother; Diverticulosis in her father; Heart disease in her father, paternal grandfather, and paternal grandmother; Hyperlipidemia in her father, paternal grandfather, and paternal grandmother; Hypertension in her father, paternal grandfather, and paternal grandmother; Restless legs syndrome in her father and paternal grandmother;  Stroke in her father, paternal grandfather, and paternal grandmother; Thyroid disease in her mother. Allergies  Allergen Reactions   Actifed [Triprolidine-Pse] Hives      Outpatient Encounter Medications as of 03/23/2022  Medication Sig   acetaminophen (TYLENOL) 500 MG tablet Take 1,000 mg by mouth every 6 (six) hours as needed for moderate pain.   atorvastatin (LIPITOR) 10 MG tablet TAKE 1 TABLET BY MOUTH EVERY DAY    Blood Glucose Monitoring Suppl (ONETOUCH VERIO REFLECT) w/Device KIT USE TO CHECK SUGAR ONCE A DAY AND AS NEEDED. DX CODE: E11.9   buPROPion (WELLBUTRIN XL) 150 MG 24 hr tablet TAKE 1 TABLET BY MOUTH EVERY DAY   Butalbital-Acetaminophen (BUTALBITAL-APAP) 50-325 MG TABS Take 1 tablet by mouth 3 (three) times daily as needed.   famotidine (PEPCID) 40 MG tablet TAKE 1 TABLET BY MOUTH EVERY DAY   ibuprofen (ADVIL,MOTRIN) 200 MG tablet Take 400 mg by mouth every 6 (six) hours as needed for headache or moderate pain.   ketoconazole (NIZORAL) 2 % cream Apply 1 application topically daily. (Patient taking differently: Apply 1 application  topically daily as needed for irritation.)   Lancets (ONETOUCH DELICA PLUS [id number]) MISC Use to check sugar once a day and as needed.  Dx code: E11.9   levocetirizine (XYZAL) 5 MG tablet Take 5 mg by mouth every evening.   levothyroxine (SYNTHROID) 175 MCG tablet Take 1 tablet (175 mcg total) by mouth daily.   melatonin 3 MG TABS tablet Take 3 mg by mouth at bedtime.   metFORMIN (GLUCOPHAGE) 1000 MG tablet TAKE 1 TABLET (1,000 MG TOTAL) BY MOUTH TWICE A DAY WITH FOOD   montelukast (SINGULAIR) 10 MG tablet Take 1 tablet (10 mg total) by mouth at bedtime. (Patient taking differently: Take 10 mg by mouth at bedtime as needed (allergies in spring).)   Multiple Vitamin (MULTIVITAMIN) tablet Take 1 tablet by mouth daily.   ONETOUCH VERIO test strip USE TO CHECK SUGAR TWICE A DAY AND AS NEEDED. DX CODE: E11.65   rOPINIRole (REQUIP) 0.25 MG tablet TAKE 1 TABLET BY MOUTH AT BEDTIME. (Patient taking differently: Take 0.25 mg by mouth as needed.)   SYMBICORT 160-4.5 MCG/ACT inhaler TAKE 2 PUFFS BY MOUTH TWICE A DAY (Patient taking differently: Inhale 2 puffs into the lungs 2 (two) times daily as needed (shortness of breath or wheezing in the spring).)   valACYclovir (VALTREX) 1000 MG tablet TAKE 1 TABLET BY MOUTH TWICE A DAY (Patient taking differently: as needed.)   venlafaxine XR  (EFFEXOR-XR) 75 MG 24 hr capsule TAKE 1 CAPSULE (75 MG TOTAL) BY MOUTH 2 (TWO) TIMES DAILY WITH BREAKFAST AND LUNCH.   [DISCONTINUED] omeprazole (PRILOSEC) 40 MG capsule TAKE 1 CAPSULE (40 MG TOTAL) BY MOUTH DAILY.   amphetamine-dextroamphetamine (ADDERALL) 10 MG tablet Take 1 tablet (10 mg total) by mouth daily as needed (focus). July 2023 (Patient not taking: Reported on 03/23/2022)   amphetamine-dextroamphetamine (ADDERALL) 10 MG tablet Take 1 tablet (10 mg total) by mouth daily as needed. August 2023 (Patient not taking: Reported on 03/23/2022)   amphetamine-dextroamphetamine (ADDERALL) 10 MG tablet Take 1 tablet (10 mg total) by mouth daily as needed. September 2023 (Patient not taking: Reported on 03/23/2022)   busPIRone (BUSPAR) 5 MG tablet Take 1 tablet (5 mg total) by mouth 2 (two) times daily as needed. (Patient not taking: Reported on 03/23/2022)   omeprazole (PRILOSEC) 40 MG capsule Take 1 capsule (40 mg total) by mouth 2 (two) times daily.   promethazine (PHENERGAN) 25 MG tablet  Take 1 tablet (25 mg total) by mouth every 8 (eight) hours as needed for nausea or vomiting. (Patient not taking: Reported on 03/23/2022)   No facility-administered encounter medications on file as of 03/23/2022.     REVIEW OF SYSTEMS  : All other systems reviewed and negative except where noted in the History of Present Illness.   PHYSICAL EXAM: BP 118/72   Pulse 90   Ht _0  (1.651 m)   Wt 230 lb 2 oz (104.4 kg)   BMI 38.29 kg/m  General: Well developed white female in no acute distress Head: Normocephalic and atraumatic Eyes:  Sclerae anicteric, conjunctiva pink. Ears: Normal auditory acuity Lungs: Clear throughout to auscultation; no W/R/R. Heart: Regular rate and rhythm; no M/R/G. Abdomen: Soft, non-distended.  BS present.  Non-tender. Rectal:  Will be done at the time of colonoscopy. Musculoskeletal: Symmetrical with no gross deformities  Skin: No lesions on visible extremities Extremities: No  edema  Neurological: Alert oriented x 4, grossly non-focal Psychological:  Alert and cooperative. Normal mood and affect  ASSESSMENT AND PLAN: *Colorectal cancer screening: Has never had a colonoscopy in the past.  We will schedule Dr. Havery Moros. *Dysphagia, sore throat, voice hoarseness: This has not been present for over a year now.  She denies any overt reflux symptoms.  She previously had only been taking famotidine, but was placed on omeprazole 40 mg once daily several months ago.  Has had mild improvement in the sore throat, but is still present and she still has the hoarseness and sensation of food getting caught high in the throat area.  Everyone keeps telling her it is reflux related.  She has an appointment with ENT next week as well.  We will go ahead and proceed with EGD at the time of her colonoscopy.  We will also maximize her acid reflux regimen with omeprazole 40 mg twice daily and Pepcid at bedtime for about 4 to 6 weeks and see if that makes a difference.  New prescription for omeprazole was sent to her pharmacy.   CC:  [name], MD

## 2022-03-24 NOTE — Progress Notes (Signed)
Agree with assessment and plan as outlined.  I see that she has not scheduled for a few weeks yet.  [place] can you see if this patient has any availability to book her procedures the week of October 16, I have a few openings for doubles that week and would like them utilized.  Thanks

## 2022-03-24 NOTE — Progress Notes (Signed)
Left patient a detailed vm offering her a sooner appt for ECL. I asked that patient give [place] a call back to let us know either way if she would like sooner appt or if she would like to keep as scheduled.

## 2022-03-25 NOTE — Progress Notes (Signed)
2nd attempt to reach patient. I left her another detailed vm offering her a sooner appt for her procedures. I asked that patient call back to let us know if she would like a sooner appt or if she would like to keep as scheduled.

## 2022-04-14 ENCOUNTER — Encounter: Payer: Self-pay | Admitting: Certified Registered Nurse Anesthetist

## 2022-04-15 ENCOUNTER — Other Ambulatory Visit: Payer: Self-pay | Admitting: Family Medicine

## 2022-04-15 ENCOUNTER — Encounter: Payer: Self-pay | Admitting: Gastroenterology

## 2022-04-22 ENCOUNTER — Encounter: Payer: Self-pay | Admitting: Gastroenterology

## 2022-04-22 ENCOUNTER — Ambulatory Visit: Payer: Managed Care, Other (non HMO) | Admitting: Gastroenterology

## 2022-04-22 VITALS — BP 143/83 | HR 91 | Temp 98.1°F | Resp 20 | Ht 65.0 in | Wt 230.0 lb

## 2022-04-22 DIAGNOSIS — Z1212 Encounter for screening for malignant neoplasm of rectum: Secondary | ICD-10-CM

## 2022-04-22 DIAGNOSIS — R1310 Dysphagia, unspecified: Secondary | ICD-10-CM

## 2022-04-22 DIAGNOSIS — Z1211 Encounter for screening for malignant neoplasm of colon: Secondary | ICD-10-CM

## 2022-04-22 DIAGNOSIS — D125 Benign neoplasm of sigmoid colon: Secondary | ICD-10-CM

## 2022-04-22 DIAGNOSIS — K225 Diverticulum of esophagus, acquired: Secondary | ICD-10-CM | POA: Diagnosis not present

## 2022-04-22 DIAGNOSIS — J029 Acute pharyngitis, unspecified: Secondary | ICD-10-CM

## 2022-04-22 DIAGNOSIS — K635 Polyp of colon: Secondary | ICD-10-CM | POA: Diagnosis not present

## 2022-04-22 DIAGNOSIS — K31819 Angiodysplasia of stomach and duodenum without bleeding: Secondary | ICD-10-CM

## 2022-04-22 MED ORDER — SODIUM CHLORIDE 0.9 % IV SOLN: 500.0000 mL | Freq: Once | INTRAVENOUS | Status: DC

## 2022-04-22 NOTE — Progress Notes (Signed)
Cell phone off per pt  Called to room to assist during endoscopic procedure.  Patient ID and intended procedure confirmed with present staff. Received instructions for my participation in the procedure from the performing physician.

## 2022-04-22 NOTE — Progress Notes (Signed)
1400 Pt wheezing and coughing, albuterol given per neb.  MD aware. vss

## 2022-04-22 NOTE — Progress Notes (Signed)
1420 Report given to PACU, vss

## 2022-04-22 NOTE — Progress Notes (Signed)
[facility] History and Physical   Primary Care Physician:  [name], MD   Reason for Procedure:   Dysphagia, GERD / sore throat, colon cancer screening  Plan:    EGD and colonoscopy     HPI: Kristina Watts is a 51 y.o. female  here for EGD and colonoscopy for issues as outlined above. On omeprazole 44m BID. Has not helped her sore throat at all. 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 Ongoing intermittent dysphagia. Patient denies any bowel symptoms at this time. No family history of colon cancer known. Otherwise feels well without any cardiopulmonary symptoms.   I have discussed risks / benefits of anesthesia and endoscopic procedure with Kristina Watts and they wish to proceed with the exams as outlined today.    Past Medical History:  Diagnosis Date   Acute bronchitis 11/19/2012   ADD (attention deficit disorder) 04/27/2012   Allergy    seasonal   Asthma    Back pain 11/07/2013   Cancer ([facility]    thyroid- March 2023   Cervical cancer screening 12/23/2014   Chicken pox as a child   Depression with anxiety 04/18/2011   Dermatitis 05/18/2011   Di[place]    GERD (gastroesophageal reflux disease)    History of gestational diabetes mellitus (GDM) 04/19/2011   History of pancreatitis 04/19/2011   Hyperlipidemia 04/27/2012   LUQ pain 12/12/2011   Migraine 08/23/2011   Otitis media of right ear 05/30/2011   Overweight(278.02) 04/19/2011   Pancreatitis, recurrent 12/12/2011   Pharyngitis 08/23/2011   Pneumonia 1998   Preventative health care 04/19/2011   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 Was done due to pancreatitis   THYROIDECTOMY N/A 08/27/2021    Procedure: TOTAL THYROIDECTOMY;  Surgeon: Kinsinger, LArta Bruce MD;  Location: [facility];  Service: General;  Laterality: N/A;   UPPER GASTROINTESTINAL ENDOSCOPY      Prior to Admission medications   Medication Sig Start Date End Date Taking? Authorizing Provider  acetaminophen (TYLENOL) 500 MG tablet Take 1,000 mg by mouth every 6 (six) hours as needed for moderate pain.   Yes [provider]  amphetamine-dextroamphetamine (ADDERALL) 10 MG tablet Take 1 tablet (10 mg total) by mouth daily as needed (focus). July 2023 12/23/21  Yes BMosie Lukes MD  atorvastatin (LIPITOR) 10 MG tablet TAKE 1 TABLET BY MOUTH EVERY DAY 01/17/22  Yes BMosie Lukes MD  buPROPion (WELLBUTRIN XL) 150 MG 24 hr tablet TAKE 1 TABLET BY MOUTH EVERY DAY 04/15/22  Yes BMosie Lukes MD  famotidine (PEPCID) 40 MG tablet TAKE 1 TABLET BY MOUTH EVERY DAY 02/02/22  Yes BMosie Lukes MD  ibuprofen (ADVIL,MOTRIN) 200 MG tablet Take 400 mg by mouth every 6 (six) hours as needed for headache or moderate pain.   Yes [provider]  levocetirizine (XYZAL) 5 MG tablet Take 5 mg by mouth every evening.   Yes [provider]  levothyroxine (SYNTHROID) 175 MCG tablet Take 1 tablet (175 mcg total) by mouth daily. 01/12/22  Yes BMosie Lukes MD  melatonin 3 MG TABS tablet Take 3 mg by mouth at bedtime.   Yes [provider]  metFORMIN (GLUCOPHAGE) 1000  MG tablet TAKE 1 TABLET (1,000 MG TOTAL) BY MOUTH TWICE A DAY WITH FOOD 12/23/21  Yes [name], MD  Multiple Vitamin (MULTIVITAMIN) tablet Take 1 tablet by mouth daily.   Yes [provider]  omeprazole (PRILOSEC) 40 MG capsule Take 1 capsule (40 mg total) by mouth 2 (two) times daily. 03/23/22  Yes Zehr, Laban Emperor, PA-C  valACYclovir (VALTREX) 1000 MG tablet TAKE 1 TABLET BY MOUTH TWICE A DAY Patient taking differently: as needed. 09/01/21  Yes [name], MD  venlafaxine XR (EFFEXOR-XR) 75 MG 24 hr capsule TAKE 1 CAPSULE (75 MG  TOTAL) BY MOUTH 2 (TWO) TIMES DAILY WITH BREAKFAST AND LUNCH. 04/27/21  Yes [name], MD  amphetamine-dextroamphetamine (ADDERALL) 10 MG tablet Take 1 tablet (10 mg total) by mouth daily as needed. August 2023 Patient not taking: Reported on 03/23/2022 12/23/21   [name], MD  amphetamine-dextroamphetamine (ADDERALL) 10 MG tablet Take 1 tablet (10 mg total) by mouth daily as needed. September 2023 Patient not taking: Reported on 03/23/2022 12/23/21   [name], MD  Blood Glucose Monitoring Suppl (ONETOUCH VERIO REFLECT) w/Device KIT USE TO CHECK SUGAR ONCE A DAY AND AS NEEDED. DX CODE: E11.9 03/25/20   [name], MD  busPIRone (BUSPAR) 5 MG tablet Take 1 tablet (5 mg total) by mouth 2 (two) times daily as needed. Patient not taking: Reported on 03/23/2022 05/18/20   [name], MD  Butalbital-Acetaminophen (BUTALBITAL-APAP) 50-325 MG TABS Take 1 tablet by mouth 3 (three) times daily as needed. 07/06/18   [name], MD  ketoconazole (NIZORAL) 2 % cream Apply 1 application topically daily. Patient taking differently: Apply 1 application  topically daily as needed for irritation. 07/25/19   [name], MD  Lancets (ONETOUCH DELICA PLUS [id number]) MISC Use to check sugar once a day and as needed.  Dx code: E11.9 02/13/20   [name], MD  montelukast (SINGULAIR) 10 MG tablet Take 1 tablet (10 mg total) by mouth at bedtime. Patient taking differently: Take 10 mg by mouth at bedtime as needed (allergies in spring). 02/11/16   [name], MD  ONETOUCH VERIO test strip USE TO CHECK SUGAR TWICE A DAY AND AS NEEDED. DX CODE: E11.65 11/16/21   [name], MD  promethazine (PHENERGAN) 25 MG tablet Take 1 tablet (25 mg total) by mouth every 8 (eight) hours as needed for nausea or vomiting. Patient not taking: Reported on 03/23/2022 07/05/18   [name], MD  rOPINIRole (REQUIP) 0.25 MG tablet TAKE 1 TABLET BY MOUTH AT BEDTIME. Patient not taking: Reported on  04/22/2022 03/21/22   [name], MD  SYMBICORT 160-4.5 MCG/ACT inhaler TAKE 2 PUFFS BY MOUTH TWICE A DAY Patient taking differently: Inhale 2 puffs into the lungs 2 (two) times daily as needed (shortness of breath or wheezing in the spring). 03/22/18   [name], MD    Current Outpatient Medications  Medication Sig Dispense Refill   acetaminophen (TYLENOL) 500 MG tablet Take 1,000 mg by mouth every 6 (six) hours as needed for moderate pain.     amphetamine-dextroamphetamine (ADDERALL) 10 MG tablet Take 1 tablet (10 mg total) by mouth daily as needed (focus). July 2023 30 tablet 0   atorvastatin (LIPITOR) 10 MG tablet TAKE 1 TABLET BY MOUTH EVERY DAY 90 tablet 1   buPROPion (WELLBUTRIN XL) 150 MG 24 hr tablet TAKE 1 TABLET BY MOUTH EVERY DAY 90 tablet 1  famotidine (PEPCID) 40 MG tablet TAKE 1 TABLET BY MOUTH EVERY DAY 90 tablet 1   ibuprofen (ADVIL,MOTRIN) 200 MG tablet Take 400 mg by mouth every 6 (six) hours as needed for headache or moderate pain.     levocetirizine (XYZAL) 5 MG tablet Take 5 mg by mouth every evening.     levothyroxine (SYNTHROID) 175 MCG tablet Take 1 tablet (175 mcg total) by mouth daily. 30 tablet 3   melatonin 3 MG TABS tablet Take 3 mg by mouth at bedtime.     metFORMIN (GLUCOPHAGE) 1000 MG tablet TAKE 1 TABLET (1,000 MG TOTAL) BY MOUTH TWICE A DAY WITH FOOD 180 tablet 1   Multiple Vitamin (MULTIVITAMIN) tablet Take 1 tablet by mouth daily.     omeprazole (PRILOSEC) 40 MG capsule Take 1 capsule (40 mg total) by mouth 2 (two) times daily. 60 capsule 2   valACYclovir (VALTREX) 1000 MG tablet TAKE 1 TABLET BY MOUTH TWICE A DAY (Patient taking differently: as needed.) 180 tablet 1   venlafaxine XR (EFFEXOR-XR) 75 MG 24 hr capsule TAKE 1 CAPSULE (75 MG TOTAL) BY MOUTH 2 (TWO) TIMES DAILY WITH BREAKFAST AND LUNCH. 180 capsule 1   amphetamine-dextroamphetamine (ADDERALL) 10 MG tablet Take 1 tablet (10 mg total) by mouth daily as needed. August 2023 (Patient not  taking: Reported on 03/23/2022) 30 tablet 0   amphetamine-dextroamphetamine (ADDERALL) 10 MG tablet Take 1 tablet (10 mg total) by mouth daily as needed. September 2023 (Patient not taking: Reported on 03/23/2022) 30 tablet 0   Blood Glucose Monitoring Suppl (ONETOUCH VERIO REFLECT) w/Device KIT USE TO CHECK SUGAR ONCE A DAY AND AS NEEDED. DX CODE: E11.9 1 kit 0   busPIRone (BUSPAR) 5 MG tablet Take 1 tablet (5 mg total) by mouth 2 (two) times daily as needed. (Patient not taking: Reported on 03/23/2022) 40 tablet 1   Butalbital-Acetaminophen (BUTALBITAL-APAP) 50-325 MG TABS Take 1 tablet by mouth 3 (three) times daily as needed. 60 each 0   ketoconazole (NIZORAL) 2 % cream Apply 1 application topically daily. (Patient taking differently: Apply 1 application  topically daily as needed for irritation.) 30 g 1   Lancets (ONETOUCH DELICA PLUS [id number]) MISC Use to check sugar once a day and as needed.  Dx code: E11.9 100 each 1   montelukast (SINGULAIR) 10 MG tablet Take 1 tablet (10 mg total) by mouth at bedtime. (Patient taking differently: Take 10 mg by mouth at bedtime as needed (allergies in spring).) 90 tablet 0   ONETOUCH VERIO test strip USE TO CHECK SUGAR TWICE A DAY AND AS NEEDED. DX CODE: E11.65 100 strip 12   promethazine (PHENERGAN) 25 MG tablet Take 1 tablet (25 mg total) by mouth every 8 (eight) hours as needed for nausea or vomiting. (Patient not taking: Reported on 03/23/2022) 30 tablet 1   rOPINIRole (REQUIP) 0.25 MG tablet TAKE 1 TABLET BY MOUTH AT BEDTIME. (Patient not taking: Reported on 04/22/2022) 90 tablet 1   SYMBICORT 160-4.5 MCG/ACT inhaler TAKE 2 PUFFS BY MOUTH TWICE A DAY (Patient taking differently: Inhale 2 puffs into the lungs 2 (two) times daily as needed (shortness of breath or wheezing in the spring).) 10.2 g 11   Current Facility-Administered Medications  Medication Dose Route Frequency Provider Last Rate Last Admin   0.9 %  sodium chloride infusion  500 mL Intravenous  Once Laryssa Hassing, Carlota Raspberry, MD        Allergies as of 04/22/2022 - Review Complete 04/22/2022  Allergen Reaction Noted  Actifed [triprolidine-pse] Hives 04/18/2011    Family History  Problem Relation Age of Onset   Thyroid disease Mother    Colon polyps Mother    Hypertension Father    Hyperlipidemia Father    Heart disease Father    Diabetes Father    COPD Father        previous smoker   Stroke Father    Depression Father    Restless legs syndrome Father    Colon polyps Father    Diverticulosis Father    Diabetes Brother    Cancer Maternal Grandmother        Head and Neck   Heart disease Paternal Grandmother    Hyperlipidemia Paternal Grandmother    Hypertension Paternal Grandmother    Diabetes Paternal Grandmother    Depression Paternal Grandmother    Stroke Paternal Grandmother    Restless legs syndrome Paternal Grandmother    Heart disease Paternal Grandfather    Hyperlipidemia Paternal Grandfather    Hypertension Paternal Grandfather    Stroke Paternal Grandfather    Diabetes Paternal Grandfather    Colon cancer Neg Hx    Esophageal cancer Neg Hx    Rectal cancer Neg Hx    Stomach cancer Neg Hx     Social History   Socioeconomic History   Marital status: Married    Spouse name: Not on file   Number of children: Not on file   Years of education: Not on file   Highest education level: Not on file  Occupational History   Not on file  Tobacco Use   Smoking status: Never   Smokeless tobacco: Never  Vaping Use   Vaping Use: Never used  Substance and Sexual Activity   Alcohol use: Yes    Comment: 1/week   Drug use: No   Sexual activity: Not Currently    Partners: Male    Comment: abstinence  Other Topics Concern   Not on file  Social History Narrative   Not on file   Social Determinants of Health   Financial Resource Strain: Not on file  Food Insecurity: Not on file  Transportation Needs: Not on file  Physical Activity: Not on file  Stress:  Not on file  Social Connections: Not on file  Intimate Partner Violence: Not on file    Review of Systems: All other review of systems negative except as mentioned in the HPI.  Physical Exam: Vital signs BP 124/73   Pulse 93   Temp 98.1 F (36.7 C) (Skin)   Ht _0  (1.651 m)   Wt 230 lb (104.3 kg)   LMP  (Approximate) Comment: 1 week ago  SpO2 96%   BMI 38.27 kg/m   General:   Alert,  Well-developed, pleasant and cooperative in NAD Lungs:  Clear throughout to auscultation.   Heart:  Regular rate and rhythm Abdomen:  Soft, nontender and nondistended.   Neuro/Psych:  Alert and cooperative. Normal mood and affect. A and O x 3  Jolly Mango, MD [facility]

## 2022-04-22 NOTE — Op Note (Signed)
[place] Patient Name: Kristina Watts Procedure Date: 04/22/2022 1:31 PM MRN: [id number] Endoscopist: Remo Lipps P. Havery Moros , MD, [id number] Age: 51 Referring MD:  Date of Birth: 06-02-1971 Gender: Female Account #: [id number] Procedure:                Colonoscopy Indications:              Screening for colorectal malignant neoplasm, This                            is the patient's first colonoscopy Medicines:                Monitored Anesthesia Care Procedure:                Pre-Anesthesia Assessment:                           - Prior to the procedure, a History and Physical                            was performed, and patient medications and                            allergies were reviewed. The patient's tolerance of                            previous anesthesia was also reviewed. The risks                            and benefits of the procedure and the sedation                            options and risks were discussed with the patient.                            All questions were answered, and informed consent                            was obtained. Prior Anticoagulants: The patient has                            taken no anticoagulant or antiplatelet agents. ASA                            Grade Assessment: II - A patient with mild systemic                            disease. After reviewing the risks and benefits,                            the patient was deemed in satisfactory condition to                            undergo the procedure.  After obtaining informed consent, the colonoscope                            was passed under direct vision. Throughout the                            procedure, the patient's blood pressure, pulse, and                            oxygen saturations were monitored continuously. The                            Olympus PCF-H190DL [id number]) Colonoscope was                            introduced through  the anus and advanced to the the                            cecum, identified by appendiceal orifice and                            ileocecal valve. The colonoscopy was performed                            without difficulty. The patient tolerated the                            procedure well. The quality of the bowel                            preparation was good. The ileocecal valve,                            appendiceal orifice, and rectum were photographed. Scope In: 1:54:08 PM Scope Out: 2:16:00 PM Scope Withdrawal Time: 0 hours 16 minutes 53 seconds  Total Procedure Duration: 0 hours 21 minutes 52 seconds  Findings:                 The perianal and digital rectal examinations were                            normal.                           The terminal ileum appeared normal.                           A 4 mm polyp was found in the sigmoid colon. The                            polyp was sessile. The polyp was removed with a                            cold snare. Resection and retrieval were complete.  Internal hemorrhoids were found during                            retroflexion. The hemorrhoids were small.                           The exam was otherwise without abnormality. Complications:            No immediate complications. Estimated blood loss:                            Minimal. Estimated Blood Loss:     Estimated blood loss was minimal. Impression:               - The examined portion of the ileum was normal.                           - One 4 mm polyp in the sigmoid colon, removed with                            a cold snare. Resected and retrieved.                           - Internal hemorrhoids.                           - The examination was otherwise normal. Recommendation:           - Patient has a contact number available for                            emergencies. The signs and symptoms of potential                            delayed  complications were discussed with the                            patient. Return to normal activities tomorrow.                            Written discharge instructions were provided to the                            patient.                           - Resume previous diet.                           - Continue present medications.                           - Await pathology results. Remo Lipps P. Asley Baskerville, MD 04/22/2022 2:20:31 PM This report has been signed electronically.

## 2022-04-22 NOTE — Op Note (Signed)
[place] Patient Name: Kristina Watts Procedure Date: 04/22/2022 1:32 PM MRN: [id number] Endoscopist: Remo Lipps P. Havery Moros , MD, [id number] Age: 51 Referring MD:  Date of Birth: Nov 07, 1970 Gender: Female Account #: [id number] Procedure:                Upper GI endoscopy Indications:              Dysphagia, throat hoarseness - treated with BID PPI                            without any improvement Medicines:                Monitored Anesthesia Care Procedure:                Pre-Anesthesia Assessment:                           - Prior to the procedure, a History and Physical                            was performed, and patient medications and                            allergies were reviewed. The patient's tolerance of                            previous anesthesia was also reviewed. The risks                            and benefits of the procedure and the sedation                            options and risks were discussed with the patient.                            All questions were answered, and informed consent                            was obtained. Prior Anticoagulants: The patient has                            taken no anticoagulant or antiplatelet agents. ASA                            Grade Assessment: II - A patient with mild systemic                            disease. After reviewing the risks and benefits,                            the patient was deemed in satisfactory condition to                            undergo the procedure.  After obtaining informed consent, the endoscope was                            passed under direct vision. Throughout the                            procedure, the patient's blood pressure, pulse, and                            oxygen saturations were monitored continuously. The                            GIF [id number] #[id number] was introduced through the                            mouth, and advanced to  the second part of duodenum.                            The upper GI endoscopy was accomplished without                            difficulty. The patient tolerated the procedure                            well. Scope In: Scope Out: Findings:                 Esophagogastric landmarks were identified: the                            Z-line was found at 39 cm, the gastroesophageal                            junction was found at 39 cm and the upper extent of                            the gastric folds was found at 39 cm from the                            incisors.                           The exam of the esophagus was otherwise normal. No                            stenosis / stricture appreciated. Mucosa normal                            without inflammatory changes.                           A guidewire was placed and the scope was withdrawn.                            Empiric dilation was  performed in the entire                            esophagus with a Savary dilator with mild                            resistance at 16 mm and 18 mm. Relook endoscopy                            showed no mucosal wrents.                           The entire examined stomach was normal.                           Benign lymphangiectasia was present in the second                            portion of the duodenum.                           A medium diverticulum was found in the second                            portion of the duodenum.                           The exam of the duodenum was otherwise normal. Complications:            No immediate complications. Estimated blood loss:                            Minimal. Estimated Blood Loss:     Estimated blood loss was minimal. Impression:               - Esophagogastric landmarks identified.                           - Normal esophagus - no erosive changes.                           - Empiric dilation performed to 19m.                           -  Normal stomach.                           - Duodenal mucosal lymphangiectasia.                           - Duodenal diverticulum.                           - Normal duodenum otherwise.                           Exam without clear cause for symptoms. While  non-erosive reflux disease (NERD) remains possible,                            given lack of improvement with PPI, normal                            endoscopy, no other reflux symptoms, I would pursue                            other causes of voice hoarseness, follow up with                            ENT and perhaps allergy. No cause for dysphagia                            seen, empiric dilation performed. Recommendation:           - Patient has a contact number available for                            emergencies. The signs and symptoms of potential                            delayed complications were discussed with the                            patient. Return to normal activities tomorrow.                            Written discharge instructions were provided to the                            patient.                           - Resume previous diet.                           - Continue present medications.                           - Await course post dilation                           - Follow up with ENT and primary care. Can consider                            24 hour pH impedance testing if symptoms persist                            and need to more objectively rule out nonerosive                            reflux disease Carlota Raspberry. Angelica Wix, MD 04/22/2022 2:28:05 PM This report has been signed electronically.

## 2022-04-22 NOTE — Patient Instructions (Signed)
Upper-Await course post dilation. Follow up with ENT and primary care. Can consider 24 hour PH impedance testing if symptoms persist and need to more objectively rule out non-erosive reflux disease. Lower-Resume previous diet and medications.   YOU HAD AN ENDOSCOPIC PROCEDURE TODAY AT [facility]:   Refer to the procedure report that was given to you for any specific questions about what was found during the examination.  If the procedure report does not answer your questions, please call your gastroenterologist to clarify.  If you requested that your care partner not be given the details of your procedure findings, then the procedure report has been included in a sealed envelope for you to review at your convenience later.  YOU SHOULD EXPECT: Some feelings of bloating in the abdomen. Passage of more gas than usual.  Walking can help get rid of the air that was put into your GI tract during the procedure and reduce the bloating. If you had a lower endoscopy (such as a colonoscopy or flexible sigmoidoscopy) you may notice spotting of blood in your stool or on the toilet paper. If you underwent a bowel prep for your procedure, you may not have a normal bowel movement for a few days.  Please Note:  You might notice some irritation and congestion in your nose or some drainage.  This is from the oxygen used during your procedure.  There is no need for concern and it should clear up in a day or so.  SYMPTOMS TO REPORT IMMEDIATELY:  Following lower endoscopy (colonoscopy or flexible sigmoidoscopy):  Excessive amounts of blood in the stool  Significant tenderness or worsening of abdominal pains  Swelling of the abdomen that is new, acute  Fever of 100F or higher  Following upper endoscopy (EGD)  Vomiting of blood or coffee ground material  New chest pain or pain under the shoulder blades  Painful or persistently difficult swallowing  New shortness of breath  Fever of 100F or  higher  Black, tarry-looking stools  For urgent or emergent issues, a gastroenterologist can be reached at any hour by calling [phone]. Do not use MyChart messaging for urgent concerns.    DIET:  We do recommend a small meal at first, but then you may proceed to your regular diet.  Drink plenty of fluids but you should avoid alcoholic beverages for 24 hours.  ACTIVITY:  You should plan to take it easy for the rest of today and you should NOT DRIVE or use heavy machinery until tomorrow (because of the sedation medicines used during the test).    FOLLOW UP: Our staff will call the number listed on your records the next business day following your procedure.  We will call around 7:15- 8:00 am to check on you and address any questions or concerns that you may have regarding the information given to you following your procedure. If we do not reach you, we will leave a message.     If any biopsies were taken you will be contacted by phone or by letter within the next 1-3 weeks.  Please call us at [phone] if you have not heard about the biopsies in 3 weeks.    SIGNATURES/CONFIDENTIALITY: You and/or your care partner have signed paperwork which will be entered into your electronic medical record.  These signatures attest to the fact that that the information above on your After Visit Summary has been reviewed and is understood.  Full responsibility of the confidentiality of this discharge  information lies with you and/or your care-partner. 

## 2022-04-22 NOTE — Progress Notes (Signed)
1330 Robinul 0.1 mg IV given due large amount of secretions upon assessment.  MD made aware, vss  °

## 2022-04-25 ENCOUNTER — Telehealth: Payer: Self-pay

## 2022-04-25 ENCOUNTER — Ambulatory Visit: Payer: Managed Care, Other (non HMO) | Admitting: Family Medicine

## 2022-04-25 ENCOUNTER — Other Ambulatory Visit (HOSPITAL_COMMUNITY)
Admission: RE | Admit: 2022-04-25 | Discharge: 2022-04-25 | Disposition: A | Discharge status: Home / Self Care | Payer: Managed Care, Other (non HMO) | Source: Ambulatory Visit | Attending: Family Medicine | Admitting: Family Medicine

## 2022-04-25 VITALS — BP 132/80 | HR 80 | Temp 98.0°F | Resp 16 | Ht 65.0 in | Wt 233.2 lb

## 2022-04-25 DIAGNOSIS — J029 Acute pharyngitis, unspecified: Secondary | ICD-10-CM | POA: Diagnosis not present

## 2022-04-25 DIAGNOSIS — Z124 Encounter for screening for malignant neoplasm of cervix: Secondary | ICD-10-CM | POA: Diagnosis present

## 2022-04-25 DIAGNOSIS — R490 Dysphonia: Secondary | ICD-10-CM

## 2022-04-25 DIAGNOSIS — J45909 Unspecified asthma, uncomplicated: Secondary | ICD-10-CM

## 2022-04-25 DIAGNOSIS — E785 Hyperlipidemia, unspecified: Secondary | ICD-10-CM

## 2022-04-25 DIAGNOSIS — E119 Type 2 diabetes mellitus without complications: Secondary | ICD-10-CM | POA: Diagnosis not present

## 2022-04-25 DIAGNOSIS — G43009 Migraine without aura, not intractable, without status migrainosus: Secondary | ICD-10-CM

## 2022-04-25 DIAGNOSIS — F988 Other specified behavioral and emotional disorders with onset usually occurring in childhood and adolescence: Secondary | ICD-10-CM

## 2022-04-25 DIAGNOSIS — E669 Obesity, unspecified: Secondary | ICD-10-CM

## 2022-04-25 DIAGNOSIS — E039 Hypothyroidism, unspecified: Secondary | ICD-10-CM

## 2022-04-25 DIAGNOSIS — Z23 Encounter for immunization: Secondary | ICD-10-CM | POA: Diagnosis not present

## 2022-04-25 NOTE — Patient Instructions (Addendum)
Add back Zyrtec in am  Hoarseness  Hoarseness, also called dysphonia, is any abnormal change in your voice that can make it difficult to speak. Your voice may sound raspy, breathy, or strained. Hoarseness is caused by a problem with your vocal cords (vocal folds). These are two bands of tissue inside your voice box (larynx). When you speak, your vocal cords move back and forth to create sound. The surfaces of your vocal cords need to be smooth for your voice to sound clear. Swelling or lumps on your vocal cords can cause hoarseness. Vocal cord problems may be the result of injuries or abnormal growths, certain diseases, upper respiratory infection, or allergies. Other causes may include medicine side effects and exposure to irritants. Follow these instructions at home:  Pay attention to any changes in your symptoms. Take these actions to stay safe and to help relieve your symptoms: Lifestyle Do not eat foods that give you heartburn, such as spicy or acidic foods like hot peppers and orange juice. These foods can cause a gastroesophageal reflux that may worsen your vocal cord problems. Limit how much alcohol and caffeine you drink as told by your health care provider. Drink enough fluid to keep your urine pale yellow. Do not use any products that contain nicotine or tobacco. These products include cigarettes, chewing tobacco, and vaping devices, such as e-cigarettes. If you need help quitting, ask your health care provider. Avoid secondhand smoke. General instructions Use a humidifier if the air in your home is dry. Avoid coughing or clearing your throat. Do not whisper. Whispering can cause muscle strain. Do not speak in a loud or harsh voice. Rest your voice. If recommended by your health care provider, schedule an appointment with a speech-language specialist. This specialist may give you methods to try that can help you avoid misusing your voice. Contact a health care provider if: Your  voice is hoarse longer than 2 weeks. You almost lose or completely lose your voice for more than 3 days. You have pain when you swallow or try to talk. You feel a lump in your neck. Get help right away if: You have trouble swallowing. You feel like you are choking when you swallow. You cough up blood or vomit blood. You have trouble breathing. You choke, cannot swallow, or cannot breathe if you lie flat. You notice swelling or a rash on your body, face, or tongue. These symptoms may represent a serious problem that is an emergency. Do not wait to see if the symptoms will go away. Get medical help right away. Call your local emergency services (911 in the [place] Do not drive yourself to the hospital. Summary Hoarseness, also called dysphonia, is any abnormal change in your voice that can make it difficult to speak. Your voice may sound raspy, breathy, or strained. Hoarseness is caused by a problem with your vocal cords (vocal folds). Do not speak in a loud or harsh voice, whisper, use nicotine or tobacco products, or eat foods that give you heartburn. See your health care provider if your hoarseness does not improve after 2 weeks. This information is not intended to replace advice given to you by your health care provider. Make sure you discuss any questions you have with your health care provider. Document Revised: 11/24/2020 Document Reviewed: 11/24/2020 Elsevier Patient Education  [place].

## 2022-04-25 NOTE — Telephone Encounter (Signed)
  Follow up Call-     04/22/2022   12:36 PM  Call back number  Post procedure Call Back phone  # [phone]  Permission to leave phone message Yes     Patient questions:  Do you have a fever, pain , or abdominal swelling? No. Pain Score  0 *  Have you tolerated food without any problems? Yes.    Have you been able to return to your normal activities? Yes.    Do you have any questions about your discharge instructions: Diet   No. Medications  No. Follow up visit  No.  Do you have questions or concerns about your Care? No.  Actions: * If pain score is 4 or above: No action needed, pain <4.

## 2022-04-25 NOTE — Progress Notes (Signed)
Subjective:   By signing my name below, I, Penni Homans MD, attest that this documentation has been prepared under the direction and in the presence of Penni Homans MD, 04/25/2022.   Patient ID: Kristina Watts, female    DOB: 1970/12/06, 51 y.o.   MRN: [id number]  No chief complaint on file.   HPI Patient is in today for an office visit.  Throat Patient reports of bilateral jaw pain with a tender/sore throat. She states that she loses her voice and confirms occasional difficulty swallowing. She has seen an ENT specialist about this issue and a nasal endoscopy was performed with no findings. She denies sob.  Allergies Patient reports that she currently uses 5 mg Xyzal at night and Zyrtec in the morning if her allergies are more severe.  Immunizations She is interested in receiving an influenza vaccine this visit. Health Maintenance Due  Topic Date Due   FOOT EXAM  Never done   OPHTHALMOLOGY EXAM  Never done   HIV Screening  Never done   Diabetic kidney evaluation - Urine ACR  Never done   Hepatitis C Screening  Never done   PAP SMEAR-Modifier  12/22/2017   COVID-19 Vaccine (4 - Pfizer risk series) 10/06/2020   INFLUENZA VACCINE  01/18/2022   HEMOGLOBIN A1C  04/05/2022    Past Medical History:  Diagnosis Date   Acute bronchitis 11/19/2012   ADD (attention deficit disorder) 04/27/2012   Allergy    seasonal   Asthma    Back pain 11/07/2013   Cancer ([place])    thyroid- March 2023   Cervical cancer screening 12/23/2014   Chicken pox as a child   Depression with anxiety 04/18/2011   Dermatitis 05/18/2011   Diabetes mellitus without complication ([place])    GERD (gastroesophageal reflux disease)    History of gestational diabetes mellitus (GDM) 04/19/2011   History of pancreatitis 04/19/2011   Hyperlipidemia 04/27/2012   LUQ pain 12/12/2011   Migraine 08/23/2011   Otitis media of right ear 05/30/2011   Overweight(278.02) 04/19/2011   Pancreatitis, recurrent 12/12/2011    Pharyngitis 08/23/2011   Pneumonia 1998   Preventative health care 04/19/2011   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 Was done due to pancreatitis   THYROIDECTOMY N/A 08/27/2021   Procedure: TOTAL THYROIDECTOMY;  Surgeon: Kinsinger, Arta Bruce, MD;  Location: [facility];  Service: General;  Laterality: N/A;   UPPER GASTROINTESTINAL ENDOSCOPY      Family History  Problem Relation Age of Onset   Thyroid disease Mother    Colon polyps Mother    Hypertension Father    Hyperlipidemia Father    Heart disease Father    Diabetes Father    COPD Father        previous smoker   Stroke Father    Depression Father    Restless legs syndrome Father    Colon polyps Father    Diverticulosis Father    Diabetes Brother    Cancer Maternal Grandmother        Head and Neck   Heart disease Paternal Grandmother    Hyperlipidemia Paternal Grandmother    Hypertension Paternal Grandmother    Diabetes Paternal Grandmother    Depression Paternal Grandmother  Stroke Paternal Grandmother    Restless legs syndrome Paternal Grandmother    Heart disease Paternal Grandfather    Hyperlipidemia Paternal Grandfather    Hypertension Paternal Grandfather    Stroke Paternal Grandfather    Diabetes Paternal Grandfather    Colon cancer Neg Hx    Esophageal cancer Neg Hx    Rectal cancer Neg Hx    Stomach cancer Neg Hx     Social History   Socioeconomic History   Marital status: Married    Spouse name: Not on file   Number of children: Not on file   Years of education: Not on file   Highest education level: Not on file  Occupational History   Not on file  Tobacco Use   Smoking status: Never   Smokeless tobacco: Never  Vaping  Use   Vaping Use: Never used  Substance and Sexual Activity   Alcohol use: Yes    Comment: 1/week   Drug use: No   Sexual activity: Not Currently    Partners: Male    Comment: abstinence  Other Topics Concern   Not on file  Social History Narrative   Not on file   Social Determinants of Health   Financial Resource Strain: Not on file  Food Insecurity: Not on file  Transportation Needs: Not on file  Physical Activity: Not on file  Stress: Not on file  Social Connections: Not on file  Intimate Partner Violence: Not on file    Outpatient Medications Prior to Visit  Medication Sig Dispense Refill   acetaminophen (TYLENOL) 500 MG tablet Take 1,000 mg by mouth every 6 (six) hours as needed for moderate pain.     amphetamine-dextroamphetamine (ADDERALL) 10 MG tablet Take 1 tablet (10 mg total) by mouth daily as needed (focus). July 2023 30 tablet 0   amphetamine-dextroamphetamine (ADDERALL) 10 MG tablet Take 1 tablet (10 mg total) by mouth daily as needed. August 2023 (Patient not taking: Reported on 03/23/2022) 30 tablet 0   amphetamine-dextroamphetamine (ADDERALL) 10 MG tablet Take 1 tablet (10 mg total) by mouth daily as needed. September 2023 (Patient not taking: Reported on 03/23/2022) 30 tablet 0   atorvastatin (LIPITOR) 10 MG tablet TAKE 1 TABLET BY MOUTH EVERY DAY 90 tablet 1   Blood Glucose Monitoring Suppl (ONETOUCH VERIO REFLECT) w/Device KIT USE TO CHECK SUGAR ONCE A DAY AND AS NEEDED. DX CODE: E11.9 1 kit 0   buPROPion (WELLBUTRIN XL) 150 MG 24 hr tablet TAKE 1 TABLET BY MOUTH EVERY DAY 90 tablet 1   busPIRone (BUSPAR) 5 MG tablet Take 1 tablet (5 mg total) by mouth 2 (two) times daily as needed. (Patient not taking: Reported on 03/23/2022) 40 tablet 1   Butalbital-Acetaminophen (BUTALBITAL-APAP) 50-325 MG TABS Take 1 tablet by mouth 3 (three) times daily as needed. 60 each 0   famotidine (PEPCID) 40 MG tablet TAKE 1 TABLET BY MOUTH EVERY DAY 90 tablet 1   ibuprofen  (ADVIL,MOTRIN) 200 MG tablet Take 400 mg by mouth every 6 (six) hours as needed for headache or moderate pain.     ketoconazole (NIZORAL) 2 % cream Apply 1 application topically daily. (Patient taking differently: Apply 1 application  topically daily as needed for irritation.) 30 g 1   Lancets (ONETOUCH DELICA PLUS YVOPFY92K) MISC Use to check sugar once a day and as needed.  Dx code: E11.9 100 each 1   levocetirizine (XYZAL) 5 MG tablet Take 5 mg by mouth every evening.     levothyroxine (  SYNTHROID) 175 MCG tablet Take 1 tablet (175 mcg total) by mouth daily. 30 tablet 3   melatonin 3 MG TABS tablet Take 3 mg by mouth at bedtime.     metFORMIN (GLUCOPHAGE) 1000 MG tablet TAKE 1 TABLET (1,000 MG TOTAL) BY MOUTH TWICE A DAY WITH FOOD 180 tablet 1   montelukast (SINGULAIR) 10 MG tablet Take 1 tablet (10 mg total) by mouth at bedtime. (Patient taking differently: Take 10 mg by mouth at bedtime as needed (allergies in spring).) 90 tablet 0   Multiple Vitamin (MULTIVITAMIN) tablet Take 1 tablet by mouth daily.     omeprazole (PRILOSEC) 40 MG capsule Take 1 capsule (40 mg total) by mouth 2 (two) times daily. 60 capsule 2   ONETOUCH VERIO test strip USE TO CHECK SUGAR TWICE A DAY AND AS NEEDED. DX CODE: E11.65 100 strip 12   promethazine (PHENERGAN) 25 MG tablet Take 1 tablet (25 mg total) by mouth every 8 (eight) hours as needed for nausea or vomiting. (Patient not taking: Reported on 03/23/2022) 30 tablet 1   rOPINIRole (REQUIP) 0.25 MG tablet TAKE 1 TABLET BY MOUTH AT BEDTIME. (Patient not taking: Reported on 04/22/2022) 90 tablet 1   SYMBICORT 160-4.5 MCG/ACT inhaler TAKE 2 PUFFS BY MOUTH TWICE A DAY (Patient taking differently: Inhale 2 puffs into the lungs 2 (two) times daily as needed (shortness of breath or wheezing in the spring).) 10.2 g 11   valACYclovir (VALTREX) 1000 MG tablet TAKE 1 TABLET BY MOUTH TWICE A DAY (Patient taking differently: as needed.) 180 tablet 1   venlafaxine XR (EFFEXOR-XR) 75  MG 24 hr capsule TAKE 1 CAPSULE (75 MG TOTAL) BY MOUTH 2 (TWO) TIMES DAILY WITH BREAKFAST AND LUNCH. 180 capsule 1   No facility-administered medications prior to visit.    Allergies  Allergen Reactions   Actifed [Triprolidine-Pse] Hives    Review of Systems  HENT:  Positive for sore throat.        (+) jaw pain   Respiratory:  Negative for shortness of breath.        Objective:    Physical Exam Constitutional:      General: She is not in acute distress.    Appearance: Normal appearance. She is not ill-appearing.  HENT:     Head: Normocephalic and atraumatic.     Right Ear: External ear normal.     Left Ear: External ear normal.  Eyes:     Extraocular Movements: Extraocular movements intact.     Pupils: Pupils are equal, round, and reactive to light.  Cardiovascular:     Rate and Rhythm: Normal rate and regular rhythm.     Heart sounds: Normal heart sounds. No murmur heard.    No gallop.  Pulmonary:     Effort: Pulmonary effort is normal. No respiratory distress.     Breath sounds: Normal breath sounds. No wheezing or rales.  Skin:    General: Skin is warm and dry.  Neurological:     Mental Status: She is alert and oriented to person, place, and time.  Psychiatric:        Judgment: Judgment normal.     There were no vitals taken for this visit. Wt Readings from Last 3 Encounters:  04/22/22 230 lb (104.3 kg)  03/23/22 230 lb 2 oz (104.4 kg)  12/23/21 229 lb (103.9 kg)       Assessment & Plan:   Problem List Items Addressed This Visit   None  No orders of the  defined types were placed in this encounter.   Cranston Neighbor MD, personally preformed the services described in this documentation.  All medical record entries made by the scribe were at my direction and in my presence.  I have reviewed the chart and discharge instructions (if applicable) and agree that the record reflects my personal performance and is accurate and complete. 04/25/2022.   I,Verona  Buck,acting as a Education administrator for Penni Homans, MD.,have documented all relevant documentation on the behalf of Penni Homans, MD,as directed by  Penni Homans, MD while in the presence of Penni Homans, MD.    Luna Glasgow

## 2022-04-26 LAB — COMPREHENSIVE METABOLIC PANEL
ALT: 49 U/L — ABNORMAL HIGH (ref 0–35)
AST: 30 U/L (ref 0–37)
Albumin: 4.3 g/dL (ref 3.5–5.2)
Alkaline Phosphatase: 70 U/L (ref 39–117)
BUN: 11 mg/dL (ref 6–23)
CO2: 27 mEq/L (ref 19–32)
Calcium: 8.8 mg/dL (ref 8.4–10.5)
Chloride: 100 mEq/L (ref 96–112)
Creatinine, Ser: 0.64 mg/dL (ref 0.40–1.20)
GFR: 104 mL/min (ref >60.00)
Glucose, Bld: 184 mg/dL — ABNORMAL HIGH (ref 70–99)
Potassium: 3.9 mEq/L (ref 3.5–5.1)
Sodium: 136 mEq/L (ref 135–145)
Total Bilirubin: 0.3 mg/dL (ref 0.2–1.2)
Total Protein: 6.9 g/dL (ref 6.0–8.3)

## 2022-04-26 LAB — CBC
HCT: 30.9 % — ABNORMAL LOW (ref 36.0–46.0)
Hemoglobin: 10.4 g/dL — ABNORMAL LOW (ref 12.0–15.0)
MCHC: 33.7 g/dL (ref 30.0–36.0)
MCV: 81.6 fl (ref 78.0–100.0)
Platelets: 340 10*3/uL (ref 150.0–400.0)
RBC: 3.78 Mil/uL — ABNORMAL LOW (ref 3.87–5.11)
RDW: 15.8 % — ABNORMAL HIGH (ref 11.5–15.5)
WBC: 7.8 10*3/uL (ref 4.0–10.5)

## 2022-04-26 LAB — HEMOGLOBIN A1C: Hgb A1c MFr Bld: 8.5 % — ABNORMAL HIGH (ref 4.6–6.5)

## 2022-04-26 LAB — TSH: TSH: 9.15 u[IU]/mL — ABNORMAL HIGH (ref 0.35–5.50)

## 2022-04-26 LAB — LIPID PANEL
Cholesterol: 168 mg/dL (ref 0–200)
HDL: 44.1 mg/dL (ref >39.00)
NonHDL: 123.93
Total CHOL/HDL Ratio: 4
Triglycerides: 329 mg/dL — ABNORMAL HIGH (ref 0.0–149.0)
VLDL: 65.8 mg/dL — ABNORMAL HIGH (ref 0.0–40.0)

## 2022-04-26 LAB — LDL CHOLESTEROL, DIRECT: Direct LDL: 104 mg/dL

## 2022-04-27 ENCOUNTER — Other Ambulatory Visit: Payer: Self-pay

## 2022-04-27 DIAGNOSIS — E119 Type 2 diabetes mellitus without complications: Secondary | ICD-10-CM

## 2022-04-27 MED ORDER — EMPAGLIFLOZIN 10 MG PO TABS: ORAL_TABLET | ORAL | 1 refills | Status: DC

## 2022-04-27 MED ORDER — LEVOTHYROXINE SODIUM 200 MCG PO TABS: 200.0000 ug | ORAL_TABLET | Freq: Every day | ORAL | 1 refills | Status: DC

## 2022-04-27 MED ORDER — ATORVASTATIN CALCIUM 20 MG PO TABS: 20.0000 mg | ORAL_TABLET | Freq: Every day | ORAL | 1 refills | Status: DC

## 2022-04-27 NOTE — Assessment & Plan Note (Signed)
Encouraged increased hydration, 64 ounces of clear fluids daily. Minimize alcohol and caffeine. Eat small frequent meals with lean proteins and complex carbs. Avoid high and low blood sugars. Get adequate sleep, 7-8 hours a night. Needs exercise daily preferably in the morning.  

## 2022-04-27 NOTE — Assessment & Plan Note (Signed)
Has had scopes done by gastroenterology and ENT and no diagnosis made. Will refer to speech therapy to see if they can help

## 2022-04-27 NOTE — Assessment & Plan Note (Signed)
Encourage heart healthy diet such as MIND or DASH diet, increase exercise, avoid trans fats, simple carbohydrates and processed foods, consider a krill or fish or flaxseed oil cap daily.  °

## 2022-04-27 NOTE — Assessment & Plan Note (Signed)
Continue current meds 

## 2022-04-27 NOTE — Assessment & Plan Note (Signed)
Can struggle with season changes but doing ok at present

## 2022-04-27 NOTE — Assessment & Plan Note (Signed)
Encouraged DASH or MIND diet, decrease po intake and increase exercise as tolerated. Needs 7-8 hours of sleep nightly. Avoid trans fats, eat small, frequent meals every 4-5 hours with lean proteins, complex carbs and healthy fats. Minimize simple carbs, high fat foods and processed foods 

## 2022-04-27 NOTE — Assessment & Plan Note (Signed)
hgba1c acceptable, minimize simple carbs. Increase exercise as tolerated. Continue current meds 

## 2022-04-29 LAB — CYTOLOGY - PAP
Comment: NEGATIVE
Diagnosis: UNDETERMINED — AB
High risk HPV: NEGATIVE

## 2022-05-09 ENCOUNTER — Other Ambulatory Visit (INDEPENDENT_AMBULATORY_CARE_PROVIDER_SITE_OTHER): Payer: Managed Care, Other (non HMO)

## 2022-05-09 ENCOUNTER — Encounter: Payer: Self-pay | Admitting: Family Medicine

## 2022-05-09 DIAGNOSIS — E119 Type 2 diabetes mellitus without complications: Secondary | ICD-10-CM | POA: Diagnosis not present

## 2022-05-10 ENCOUNTER — Other Ambulatory Visit: Payer: Self-pay

## 2022-05-10 DIAGNOSIS — D72829 Elevated white blood cell count, unspecified: Secondary | ICD-10-CM

## 2022-05-10 LAB — CBC WITH DIFFERENTIAL/PLATELET
Basophils Absolute: 0.1 10*3/uL (ref 0.0–0.1)
Basophils Relative: 1.3 % (ref 0.0–3.0)
Eosinophils Absolute: 0.1 10*3/uL (ref 0.0–0.7)
Eosinophils Relative: 1.1 % (ref 0.0–5.0)
HCT: 36.2 % (ref 36.0–46.0)
Hemoglobin: 12 g/dL (ref 12.0–15.0)
Lymphocytes Relative: 21 % (ref 12.0–46.0)
Lymphs Abs: 2.4 10*3/uL (ref 0.7–4.0)
MCHC: 33.1 g/dL (ref 30.0–36.0)
MCV: 82.7 fl (ref 78.0–100.0)
Monocytes Absolute: 0.6 10*3/uL (ref 0.1–1.0)
Monocytes Relative: 5.3 % (ref 3.0–12.0)
Neutro Abs: 8 10*3/uL — ABNORMAL HIGH (ref 1.4–7.7)
Neutrophils Relative %: 71.3 % (ref 43.0–77.0)
Platelets: 389 10*3/uL (ref 150.0–400.0)
RBC: 4.37 Mil/uL (ref 3.87–5.11)
RDW: 16.6 % — ABNORMAL HIGH (ref 11.5–15.5)
WBC: 11.2 10*3/uL — ABNORMAL HIGH (ref 4.0–10.5)

## 2022-05-23 ENCOUNTER — Other Ambulatory Visit: Payer: Self-pay | Admitting: Family Medicine

## 2022-05-23 ENCOUNTER — Encounter: Payer: Self-pay | Admitting: Family Medicine

## 2022-05-23 DIAGNOSIS — R5383 Other fatigue: Secondary | ICD-10-CM

## 2022-05-23 DIAGNOSIS — R748 Abnormal levels of other serum enzymes: Secondary | ICD-10-CM

## 2022-05-23 DIAGNOSIS — D72829 Elevated white blood cell count, unspecified: Secondary | ICD-10-CM

## 2022-05-24 ENCOUNTER — Other Ambulatory Visit (INDEPENDENT_AMBULATORY_CARE_PROVIDER_SITE_OTHER): Payer: Managed Care, Other (non HMO)

## 2022-05-24 DIAGNOSIS — R748 Abnormal levels of other serum enzymes: Secondary | ICD-10-CM | POA: Diagnosis not present

## 2022-05-24 DIAGNOSIS — R5383 Other fatigue: Secondary | ICD-10-CM | POA: Diagnosis not present

## 2022-05-24 DIAGNOSIS — D72829 Elevated white blood cell count, unspecified: Secondary | ICD-10-CM

## 2022-05-24 NOTE — Telephone Encounter (Signed)
Called pt and lab appt was made for today

## 2022-05-25 LAB — HEPATITIS PANEL, ACUTE
Hep A IgM: NONREACTIVE
Hep B C IgM: NONREACTIVE
Hepatitis B Surface Ag: NONREACTIVE
Hepatitis C Ab: NONREACTIVE

## 2022-05-25 LAB — CBC WITH DIFFERENTIAL/PLATELET
Basophils Absolute: 0.1 10*3/uL (ref 0.0–0.1)
Basophils Relative: 1.1 % (ref 0.0–3.0)
Eosinophils Absolute: 0.1 10*3/uL (ref 0.0–0.7)
Eosinophils Relative: 0.8 % (ref 0.0–5.0)
HCT: 37.1 % (ref 36.0–46.0)
Hemoglobin: 12.3 g/dL (ref 12.0–15.0)
Lymphocytes Relative: 18.2 % (ref 12.0–46.0)
Lymphs Abs: 1.5 10*3/uL (ref 0.7–4.0)
MCHC: 33.1 g/dL (ref 30.0–36.0)
MCV: 84.3 fl (ref 78.0–100.0)
Monocytes Absolute: 0.7 10*3/uL (ref 0.1–1.0)
Monocytes Relative: 8.2 % (ref 3.0–12.0)
Neutro Abs: 5.9 10*3/uL (ref 1.4–7.7)
Neutrophils Relative %: 71.7 % (ref 43.0–77.0)
Platelets: 326 10*3/uL (ref 150.0–400.0)
RBC: 4.4 Mil/uL (ref 3.87–5.11)
RDW: 18 % — ABNORMAL HIGH (ref 11.5–15.5)
WBC: 8.2 10*3/uL (ref 4.0–10.5)

## 2022-05-25 LAB — COMPREHENSIVE METABOLIC PANEL
ALT: 50 U/L — ABNORMAL HIGH (ref 0–35)
AST: 28 U/L (ref 0–37)
Albumin: 4.6 g/dL (ref 3.5–5.2)
Alkaline Phosphatase: 75 U/L (ref 39–117)
BUN: 11 mg/dL (ref 6–23)
CO2: 29 mEq/L (ref 19–32)
Calcium: 9 mg/dL (ref 8.4–10.5)
Chloride: 100 mEq/L (ref 96–112)
Creatinine, Ser: 0.69 mg/dL (ref 0.40–1.20)
GFR: 102.08 mL/min (ref >60.00)
Glucose, Bld: 176 mg/dL — ABNORMAL HIGH (ref 70–99)
Potassium: 4.1 mEq/L (ref 3.5–5.1)
Sodium: 137 mEq/L (ref 135–145)
Total Bilirubin: 0.4 mg/dL (ref 0.2–1.2)
Total Protein: 7.1 g/dL (ref 6.0–8.3)

## 2022-05-25 LAB — CMV IGM: CMV IgM: <30 AU/mL

## 2022-05-25 LAB — MONONUCLEOSIS SCREEN: Mono Screen: NEGATIVE

## 2022-06-06 ENCOUNTER — Other Ambulatory Visit: Payer: Managed Care, Other (non HMO)

## 2022-06-19 ENCOUNTER — Other Ambulatory Visit: Payer: Self-pay | Admitting: Family Medicine

## 2022-06-24 ENCOUNTER — Other Ambulatory Visit: Payer: Self-pay | Admitting: Family Medicine

## 2022-07-11 NOTE — Assessment & Plan Note (Signed)
Encouraged DASH or MIND diet, decrease po intake and increase exercise as tolerated. Needs 7-8 hours of sleep nightly. Avoid trans fats, eat small, frequent meals every 4-5 hours with lean proteins, complex carbs and healthy fats. Minimize simple carbs, high fat foods and processed foods 

## 2022-07-11 NOTE — Assessment & Plan Note (Addendum)
Patient encouraged to maintain heart healthy diet, regular exercise, adequate sleep. Consider daily probiotics. Take medications as prescribed. Labs ordered and reviewed. MGM 07/2021 repeat this year Pap 04/2022 Colon 04/2022

## 2022-07-11 NOTE — Assessment & Plan Note (Signed)
hgba1c acceptable, minimize simple carbs. Increase exercise as tolerated. Continue current meds 

## 2022-07-12 ENCOUNTER — Ambulatory Visit (INDEPENDENT_AMBULATORY_CARE_PROVIDER_SITE_OTHER): Payer: Managed Care, Other (non HMO) | Admitting: Family Medicine

## 2022-07-12 ENCOUNTER — Other Ambulatory Visit: Payer: Self-pay

## 2022-07-12 VITALS — BP 128/80 | HR 91 | Temp 97.5°F | Resp 16 | Ht 65.0 in | Wt 224.2 lb

## 2022-07-12 DIAGNOSIS — M25512 Pain in left shoulder: Secondary | ICD-10-CM

## 2022-07-12 DIAGNOSIS — E669 Obesity, unspecified: Secondary | ICD-10-CM | POA: Diagnosis not present

## 2022-07-12 DIAGNOSIS — L989 Disorder of the skin and subcutaneous tissue, unspecified: Secondary | ICD-10-CM | POA: Diagnosis not present

## 2022-07-12 DIAGNOSIS — R490 Dysphonia: Secondary | ICD-10-CM

## 2022-07-12 DIAGNOSIS — E785 Hyperlipidemia, unspecified: Secondary | ICD-10-CM

## 2022-07-12 DIAGNOSIS — Z0000 Encounter for general adult medical examination without abnormal findings: Secondary | ICD-10-CM

## 2022-07-12 DIAGNOSIS — E119 Type 2 diabetes mellitus without complications: Secondary | ICD-10-CM | POA: Diagnosis not present

## 2022-07-12 DIAGNOSIS — L578 Other skin changes due to chronic exposure to nonionizing radiation: Secondary | ICD-10-CM | POA: Diagnosis not present

## 2022-07-12 DIAGNOSIS — F988 Other specified behavioral and emotional disorders with onset usually occurring in childhood and adolescence: Secondary | ICD-10-CM

## 2022-07-12 DIAGNOSIS — E039 Hypothyroidism, unspecified: Secondary | ICD-10-CM

## 2022-07-12 MED ORDER — TRIAMCINOLONE ACETONIDE 0.1 % EX OINT: 1.0000 | TOPICAL_OINTMENT | Freq: Every day | CUTANEOUS | 1 refills | Status: AC

## 2022-07-12 NOTE — Progress Notes (Signed)
Subjective:   By signing my name below, I, Kristina Watts, attest that this documentation has been prepared under the direction and in the presence of Kristina Lukes, MD., 07/12/2022.     Patient ID: Kristina Watts, female    DOB: 04-05-1971, 52 y.o.   MRN: [id number]  Chief Complaint  Patient presents with   Annual Exam    Annual Exam    HPI Patient is in today for a comprehensive physical exam and follow up on chronic medical concerns. She denies CP/palpitations/SOB/HA/congestion/fever/ chills/GI or GU symptoms.  Decreased ROM (Left Shoulder) Patient complains of decreased range of motion in her left shoulder. She experiences pain when rasing and moving her left arm in a circular motion. She is interested in following with sports medicine to manage this.  Diabetes Mellitus Patient has been taking Jardiance daily for the past week to control her DM. Lab Results  Component Value Date   HGBA1C 8.5 (H) 04/25/2022   Family History A cyst has been discovered on her mother's kidney and pancreas.  Fatigue Patient sleeps 6-8 hours nightly but feels fatigue throughout the day.  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 She is interested in following with speech pathology to manage this.  Supplements She continues to take 2 otc iron tablets daily.  Past Medical History:  Diagnosis Date   Acute bronchitis 11/19/2012   ADD (attention deficit disorder) 04/27/2012   Allergy    seasonal   Asthma    Back pain 11/07/2013   Cancer [facility])    thyroid- March 2023   Cervical cancer screening 12/23/2014   Chicken pox as a child   Depression with anxiety 04/18/2011   Dermatitis 05/18/2011   Diabetes mellitus without complication ([place])    GERD (gastroesophageal reflux disease)    History of gestational diabetes mellitus (GDM) 04/19/2011   History of pancreatitis 04/19/2011   Hyperlipidemia 04/27/2012   LUQ pain  12/12/2011   Migraine 08/23/2011   Otitis media of right ear 05/30/2011   Overweight(278.02) 04/19/2011   Pancreatitis, recurrent 12/12/2011   Pharyngitis 08/23/2011   Pneumonia 1998   Preventative health care 04/19/2011   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 Was done due to pancreatitis   THYROIDECTOMY N/A 08/27/2021   Procedure: TOTAL THYROIDECTOMY;  Surgeon: Kinsinger, Arta Bruce, MD;  Location: [facility];  Service: General;  Laterality: N/A;   UPPER GASTROINTESTINAL ENDOSCOPY      Family History  Problem Relation Age of Onset   Thyroid disease Mother    Colon polyps Mother    Hypertension Father    Hyperlipidemia Father    Heart disease Father    Diabetes Father    COPD Father        previous smoker   Stroke Father    Depression Father    Restless legs syndrome Father    Colon polyps Father    Diverticulosis Father    Diabetes Brother    Cancer Maternal Grandmother        Head and Neck   Heart disease Paternal Grandmother    Hyperlipidemia Paternal Grandmother    Hypertension Paternal Grandmother    Diabetes Paternal Grandmother  Depression Paternal Grandmother    Stroke Paternal Grandmother    Restless legs syndrome Paternal Grandmother    Heart disease Paternal Grandfather    Hyperlipidemia Paternal Grandfather    Hypertension Paternal Grandfather    Stroke Paternal Grandfather    Diabetes Paternal Grandfather    Colon cancer Neg Hx    Esophageal cancer Neg Hx    Rectal cancer Neg Hx    Stomach cancer Neg Hx     Social History   Socioeconomic History   Marital status: Married    Spouse name: Not on file   Number of children: Not on file   Years of education: Not on file    Highest education level: Not on file  Occupational History   Not on file  Tobacco Use   Smoking status: Never   Smokeless tobacco: Never  Vaping Use   Vaping Use: Never used  Substance and Sexual Activity   Alcohol use: Yes    Comment: 1/week   Drug use: No   Sexual activity: Not Currently    Partners: Male    Comment: abstinence  Other Topics Concern   Not on file  Social History Narrative   Not on file   Social Determinants of Health   Financial Resource Strain: Not on file  Food Insecurity: Not on file  Transportation Needs: Not on file  Physical Activity: Not on file  Stress: Not on file  Social Connections: Not on file  Intimate Partner Violence: Not on file    Outpatient Medications Prior to Visit  Medication Sig Dispense Refill   acetaminophen (TYLENOL) 500 MG tablet Take 1,000 mg by mouth every 6 (six) hours as needed for moderate pain.     amphetamine-dextroamphetamine (ADDERALL) 10 MG tablet Take 1 tablet (10 mg total) by mouth daily as needed (focus). July 2023 30 tablet 0   amphetamine-dextroamphetamine (ADDERALL) 10 MG tablet Take 1 tablet (10 mg total) by mouth daily as needed. August 2023 30 tablet 0   amphetamine-dextroamphetamine (ADDERALL) 10 MG tablet Take 1 tablet (10 mg total) by mouth daily as needed. September 2023 30 tablet 0   atorvastatin (LIPITOR) 10 MG tablet TAKE 1 TABLET BY MOUTH EVERY DAY 90 tablet 1   atorvastatin (LIPITOR) 20 MG tablet Take 1 tablet (20 mg total) by mouth daily. 90 tablet 1   Blood Glucose Monitoring Suppl (ONETOUCH VERIO REFLECT) w/Device KIT USE TO CHECK SUGAR ONCE A DAY AND AS NEEDED. DX CODE: E11.9 1 kit 0   buPROPion (WELLBUTRIN XL) 150 MG 24 hr tablet TAKE 1 TABLET BY MOUTH EVERY DAY 90 tablet 1   busPIRone (BUSPAR) 5 MG tablet Take 1 tablet (5 mg total) by mouth 2 (two) times daily as needed. 40 tablet 1   Butalbital-Acetaminophen (BUTALBITAL-APAP) 50-325 MG TABS Take 1 tablet by mouth 3 (three) times daily as needed.  60 each 0   empagliflozin (JARDIANCE) 10 MG TABS tablet Take table 10 mg by mouth daily 30 tablet 1   famotidine (PEPCID) 40 MG tablet TAKE 1 TABLET BY MOUTH EVERY DAY 90 tablet 1   ibuprofen (ADVIL,MOTRIN) 200 MG tablet Take 400 mg by mouth every 6 (six) hours as needed for headache or moderate pain.     ketoconazole (NIZORAL) 2 % cream Apply 1 application topically daily. (Patient taking differently: Apply 1 application  topically daily as needed for irritation.) 30 g 1   Lancets (ONETOUCH DELICA PLUS FYBOFB51W) MISC Use to check sugar once a day and as needed.  Dx code: E11.9 100 each 1   levocetirizine (XYZAL) 5 MG tablet Take 5 mg by mouth every evening.     levothyroxine (SYNTHROID) 200 MCG tablet Take 1 tablet (200 mcg total) by mouth daily. 90 tablet 1   melatonin 3 MG TABS tablet Take 3 mg by mouth at bedtime.     metFORMIN (GLUCOPHAGE) 1000 MG tablet TAKE 1 TABLET (1,000 MG TOTAL) BY MOUTH TWICE A DAY WITH FOOD 180 tablet 1   montelukast (SINGULAIR) 10 MG tablet Take 1 tablet (10 mg total) by mouth at bedtime. (Patient taking differently: Take 10 mg by mouth at bedtime as needed (allergies in spring).) 90 tablet 0   Multiple Vitamin (MULTIVITAMIN) tablet Take 1 tablet by mouth daily.     omeprazole (PRILOSEC) 40 MG capsule Take 1 capsule (40 mg total) by mouth 2 (two) times daily. 60 capsule 2   ONETOUCH VERIO test strip USE TO CHECK SUGAR TWICE A DAY AND AS NEEDED. DX CODE: E11.65 100 strip 12   promethazine (PHENERGAN) 25 MG tablet Take 1 tablet (25 mg total) by mouth every 8 (eight) hours as needed for nausea or vomiting. 30 tablet 1   SYMBICORT 160-4.5 MCG/ACT inhaler TAKE 2 PUFFS BY MOUTH TWICE A DAY (Patient taking differently: Inhale 2 puffs into the lungs 2 (two) times daily as needed (shortness of breath or wheezing in the spring).) 10.2 g 11   valACYclovir (VALTREX) 1000 MG tablet TAKE 1 TABLET BY MOUTH TWICE A DAY (Patient taking differently: as needed.) 180 tablet 1    venlafaxine XR (EFFEXOR-XR) 75 MG 24 hr capsule TAKE 1 CAPSULE (75 MG TOTAL) BY MOUTH 2 (TWO) TIMES DAILY WITH BREAKFAST AND LUNCH. 180 capsule 1   rOPINIRole (REQUIP) 0.25 MG tablet TAKE 1 TABLET BY MOUTH AT BEDTIME. 90 tablet 1   No facility-administered medications prior to visit.    Allergies  Allergen Reactions   Actifed [Triprolidine-Pse] Hives    Review of Systems  Constitutional:  Negative for chills and fever.  HENT:  Negative for congestion.   Respiratory:  Negative for shortness of breath.   Cardiovascular:  Negative for chest pain and palpitations.  Gastrointestinal:  Negative for abdominal pain, blood in stool, constipation, diarrhea, nausea and vomiting.  Genitourinary:  Negative for dysuria, frequency, hematuria and urgency.  Musculoskeletal:  Positive for joint pain (left shoulder).       Decreased ROM in the left shoulder.  Skin:           Neurological:  Negative for headaches.      Objective:    Physical Exam Constitutional:      General: She is not in acute distress.    Appearance: Normal appearance. She is normal weight. She is not ill-appearing.  HENT:     Head: Normocephalic and atraumatic.     Right Ear: Tympanic membrane, ear canal and external ear normal.     Left Ear: Tympanic membrane, ear canal and external ear normal.     Nose: Nose normal.     Mouth/Throat:     Mouth: Mucous membranes are moist.     Pharynx: Oropharynx is clear.  Eyes:     General:        Right eye: No discharge.        Left eye: No discharge.     Extraocular Movements: Extraocular movements intact.     Right eye: No nystagmus.     Left eye: No nystagmus.     Pupils: Pupils are equal,  round, and reactive to light.  Neck:     Vascular: No carotid bruit.  Cardiovascular:     Rate and Rhythm: Normal rate and regular rhythm.     Pulses: Normal pulses.     Heart sounds: Normal heart sounds. No murmur heard.    No gallop.  Pulmonary:     Effort: Pulmonary effort is  normal. No respiratory distress.     Breath sounds: Normal breath sounds. No wheezing or rales.  Abdominal:     General: Bowel sounds are normal.     Palpations: Abdomen is soft.     Tenderness: There is abdominal tenderness (upper right abdomen). There is no guarding.  Musculoskeletal:        General: Normal range of motion.     Cervical back: Normal range of motion.     Right lower leg: No edema.     Left lower leg: No edema.     Comments: Muscle strength 5/5 on upper and lower extremities.   Lymphadenopathy:     Cervical: No cervical adenopathy.  Skin:    General: Skin is warm and dry.     Findings: Lesion (posteror left leg) present.     Comments: Sun-damaged skin on the right thumb.  Neurological:     Mental Status: She is alert and oriented to person, place, and time.     Sensory: Sensation is intact.     Motor: Motor function is intact.     Coordination: Coordination is intact.     Deep Tendon Reflexes:     Reflex Scores:      Patellar reflexes are 2+ on the right side and 2+ on the left side. Psychiatric:        Mood and Affect: Mood normal.        Behavior: Behavior normal.        Judgment: Judgment normal.     BP 128/80 (BP Location: Right Arm, Patient Position: Sitting, Cuff Size: Normal)   Pulse 91   Temp (!) 97.5 F (36.4 C) (Oral)   Resp 16   Ht '5\' 5"'$  (1.651 m)   Wt 224 lb 3.2 oz (101.7 kg)   SpO2 96%   BMI 37.31 kg/m  Wt Readings from Last 3 Encounters:  07/12/22 224 lb 3.2 oz (101.7 kg)  04/25/22 233 lb 3.2 oz (105.8 kg)  04/22/22 230 lb (104.3 kg)    Diabetic Foot Exam - Simple   No data filed    Lab Results  Component Value Date   WBC 8.2 05/24/2022   HGB 12.3 05/24/2022   HCT 37.1 05/24/2022   PLT 326.0 05/24/2022   GLUCOSE 176 (H) 05/24/2022   CHOL 168 04/25/2022   TRIG 329.0 (H) 04/25/2022   HDL 44.10 04/25/2022   LDLDIRECT 104.0 04/25/2022   LDLCALC 60 05/11/2020   ALT 50 (H) 05/24/2022   AST 28 05/24/2022   NA 137 05/24/2022    K 4.1 05/24/2022   CL 100 05/24/2022   CREATININE 0.69 05/24/2022   BUN 11 05/24/2022   CO2 29 05/24/2022   TSH 9.15 (H) 04/25/2022   HGBA1C 8.5 (H) 04/25/2022    Lab Results  Component Value Date   TSH 9.15 (H) 04/25/2022   Lab Results  Component Value Date   WBC 8.2 05/24/2022   HGB 12.3 05/24/2022   HCT 37.1 05/24/2022   MCV 84.3 05/24/2022   PLT 326.0 05/24/2022   Lab Results  Component Value Date   NA 137 05/24/2022  K 4.1 05/24/2022   CO2 29 05/24/2022   GLUCOSE 176 (H) 05/24/2022   BUN 11 05/24/2022   CREATININE 0.69 05/24/2022   BILITOT 0.4 05/24/2022   ALKPHOS 75 05/24/2022   AST 28 05/24/2022   ALT 50 (H) 05/24/2022   PROT 7.1 05/24/2022   ALBUMIN 4.6 05/24/2022   CALCIUM 9.0 05/24/2022   ANIONGAP 8 08/28/2021   GFR 102.08 05/24/2022   Lab Results  Component Value Date   CHOL 168 04/25/2022   Lab Results  Component Value Date   HDL 44.10 04/25/2022   Lab Results  Component Value Date   LDLCALC 60 05/11/2020   Lab Results  Component Value Date   TRIG 329.0 (H) 04/25/2022   Lab Results  Component Value Date   CHOLHDL 4 04/25/2022   Lab Results  Component Value Date   HGBA1C 8.5 (H) 04/25/2022      Assessment & Plan:  Colonoscopy: Last completed on 04/22/2022. Repeat in 10 years. -The examined portion of the ileum was normal. -One 4 mm polyp in the sigmoid colon, removed with a cold snare. Resected and retrieved. -Internal hemorrhoids. -The examination was otherwise normal.  Mammogram: Last completed on 08/09/2021. No mammographic evidence of malignancy. Repeat in 1-2 years.  Pap Smear: Last completed on 04/25/2022. Atypical squamous cells of undetermined significance. Repeat in 1-3 years.  Advanced Directives: Encouraged patient to complete advanced care planning documents.  Healthy Lifestyle: Encouraged a minimum of 4000 daily steps, heart healthy diet, and hydration.  Immunizations: Reviewed patient's immunization history and  encouraged COVID-19 immunization. Pneumonia immunization administered today in office.  Decreased ROM (Left Shoulder): A referral to sports medicine has been made.  Sun-damaged skin (Right Thumb): Triamcinolone 0.1% has been prescribed to manage sun-damaged skin on the right thumb. A referral to dermatology has also been made.  Labs: Lab appointment and routine blood work has been ordered for 07/27/2022.  Problem List Items Addressed This Visit     ADD (attention deficit disorder)    Stable on current meds no changes      Diabetes mellitus without complication (HCC)    DHRC1U acceptable, minimize simple carbs. Increase exercise as tolerated. Continue current meds       Relevant Orders   CBC with Differential/Platelet   Comprehensive metabolic panel   Hemoglobin A1c   Hoarseness of voice    Referred [facility] voice clinc at [facility]      Hyperlipidemia    Encourage heart healthy diet such as MIND or DASH diet, increase exercise, avoid trans fats, simple carbohydrates and processed foods, consider a krill or fish or flaxseed oil cap daily.        Relevant Orders   Lipid panel   Hypothyroidism    On Levothyroxine, continue to monitor       Relevant Orders   TSH   Left shoulder pain    Encouraged moist heat and gentle stretching as tolerated. May try NSAIDs and prescription meds as directed and report if symptoms worsen or seek immediate care referred to sports medicine      Relevant Orders   Ambulatory referral to Sports Medicine   Obesity (BMI 30-39.9) - Primary    Encouraged DASH or MIND diet, decrease po intake and increase exercise as tolerated. Needs 7-8 hours of sleep nightly. Avoid trans fats, eat small, frequent meals every 4-5 hours with lean proteins, complex carbs and healthy fats. Minimize simple carbs, high fat foods and processed foods       Preventative health  care    Patient encouraged to maintain heart healthy diet, regular exercise, adequate sleep. Consider daily  probiotics. Take medications as prescribed. Labs ordered and reviewed. MGM 07/2021 repeat this year Pap 04/2022 Colon 04/2022      Sun-damaged skin    Referred to dermatology      Relevant Orders   Ambulatory referral to Dermatology   Other Visit Diagnoses     Skin lesion       Relevant Orders   Ambulatory referral to Dermatology      Meds ordered this encounter  Medications   triamcinolone ointment (KENALOG) 0.1 %    Sig: Apply 1 Application topically at bedtime.    Dispense:  80 g    Refill:  1   I, Penni Homans, MD, personally preformed the services described in this documentation.  All medical record entries made by the scribe were at my direction and in my presence.  I have reviewed the chart and discharge instructions (if applicable) and agree that the record reflects my personal performance and is accurate and complete. 07/12/2022  I,Mohammed Iqbal,acting as a scribe for Penni Homans, MD.,have documented all relevant documentation on the behalf of Penni Homans, MD,as directed by  Penni Homans, MD while in the presence of Penni Homans, MD.  Penni Homans, MD

## 2022-07-12 NOTE — Patient Instructions (Signed)
Preventive Care 52 Years Old, Female Preventive care refers to lifestyle choices and visits with your health care provider that can promote health and wellness. Preventive care visits are also called wellness exams. What can I expect for my preventive care visit? Counseling Your health care provider may ask you questions about your: Medical history, including: Past medical problems. Family medical history. Pregnancy history. Current health, including: Menstrual cycle. Method of birth control. Emotional well-being. Home life and relationship well-being. Sexual activity and sexual health. Lifestyle, including: Alcohol, nicotine or tobacco, and drug use. Access to firearms. Diet, exercise, and sleep habits. Work and work environment. Sunscreen use. Safety issues such as seatbelt and bike helmet use. Physical exam Your health care provider will check your: Height and weight. These may be used to calculate your BMI (body mass index). BMI is a measurement that tells if you are at a healthy weight. Waist circumference. This measures the distance around your waistline. This measurement also tells if you are at a healthy weight and may help predict your risk of certain diseases, such as type 2 diabetes and high blood pressure. Heart rate and blood pressure. Body temperature. Skin for abnormal spots. What immunizations do I need?  Vaccines are usually given at various ages, according to a schedule. Your health care provider will recommend vaccines for you based on your age, medical history, and lifestyle or other factors, such as travel or where you work. What tests do I need? Screening Your health care provider may recommend screening tests for certain conditions. This may include: Lipid and cholesterol levels. Diabetes screening. This is done by checking your blood sugar (glucose) after you have not eaten for a while (fasting). Pelvic exam and Pap test. Hepatitis B test. Hepatitis C  test. HIV (human immunodeficiency virus) test. STI (sexually transmitted infection) testing, if you are at risk. Lung cancer screening. Colorectal cancer screening. Mammogram. Talk with your health care provider about when you should start having regular mammograms. This may depend on whether you have a family history of breast cancer. BRCA-related cancer screening. This may be done if you have a family history of breast, ovarian, tubal, or peritoneal cancers. Bone density scan. This is done to screen for osteoporosis. Talk with your health care provider about your test results, treatment options, and if necessary, the need for more tests. Follow these instructions at home: Eating and drinking  Eat a diet that includes fresh fruits and vegetables, whole grains, lean protein, and low-fat dairy products. Take vitamin and mineral supplements as recommended by your health care provider. Do not drink alcohol if: Your health care provider tells you not to drink. You are pregnant, may be pregnant, or are planning to become pregnant. If you drink alcohol: Limit how much you have to 0-1 drink a day. Know how much alcohol is in your drink. In the [place] one drink equals one 12 oz bottle of beer (355 mL), one 5 oz glass of wine (148 mL), or one 1 oz glass of hard liquor (44 mL). Lifestyle Brush your teeth every morning and night with fluoride toothpaste. Floss one time each day. Exercise for at least 30 minutes 5 or more days each week. Do not use any products that contain nicotine or tobacco. These products include cigarettes, chewing tobacco, and vaping devices, such as e-cigarettes. If you need help quitting, ask your health care provider. Do not use drugs. If you are sexually active, practice safe sex. Use a condom or other form of protection to   prevent STIs. If you do not wish to become pregnant, use a form of birth control. If you plan to become pregnant, see your health care provider for a  prepregnancy visit. Take aspirin only as told by your health care provider. Make sure that you understand how much to take and what form to take. Work with your health care provider to find out whether it is safe and beneficial for you to take aspirin daily. Find healthy ways to manage stress, such as: Meditation, yoga, or listening to music. Journaling. Talking to a trusted person. Spending time with friends and family. Minimize exposure to UV radiation to reduce your risk of skin cancer. Safety Always wear your seat belt while driving or riding in a vehicle. Do not drive: If you have been drinking alcohol. Do not ride with someone who has been drinking. When you are tired or distracted. While texting. If you have been using any mind-altering substances or drugs. Wear a helmet and other protective equipment during sports activities. If you have firearms in your house, make sure you follow all gun safety procedures. Seek help if you have been physically or sexually abused. What's next? Visit your health care provider once a year for an annual wellness visit. Ask your health care provider how often you should have your eyes and teeth checked. Stay up to date on all vaccines. This information is not intended to replace advice given to you by your health care provider. Make sure you discuss any questions you have with your health care provider. Document Revised: 12/02/2020 Document Reviewed: 12/02/2020 Elsevier Patient Education  [place].

## 2022-07-17 DIAGNOSIS — E039 Hypothyroidism, unspecified: Secondary | ICD-10-CM | POA: Insufficient documentation

## 2022-07-17 DIAGNOSIS — L578 Other skin changes due to chronic exposure to nonionizing radiation: Secondary | ICD-10-CM | POA: Insufficient documentation

## 2022-07-17 DIAGNOSIS — M25512 Pain in left shoulder: Secondary | ICD-10-CM | POA: Insufficient documentation

## 2022-07-17 NOTE — Assessment & Plan Note (Signed)
Encouraged moist heat and gentle stretching as tolerated. May try NSAIDs and prescription meds as directed and report if symptoms worsen or seek immediate care referred to sports medicine

## 2022-07-17 NOTE — Assessment & Plan Note (Signed)
Stable on current meds no changes 

## 2022-07-17 NOTE — Assessment & Plan Note (Signed)
Referred to voice clinc at [facility]

## 2022-07-17 NOTE — Assessment & Plan Note (Signed)
On Levothyroxine, continue to monitor 

## 2022-07-17 NOTE — Assessment & Plan Note (Signed)
Referred to dermatology

## 2022-07-17 NOTE — Assessment & Plan Note (Signed)
Encourage heart healthy diet such as MIND or DASH diet, increase exercise, avoid trans fats, simple carbohydrates and processed foods, consider a krill or fish or flaxseed oil cap daily.  °

## 2022-07-20 ENCOUNTER — Ambulatory Visit: Payer: Self-pay

## 2022-07-20 ENCOUNTER — Ambulatory Visit (INDEPENDENT_AMBULATORY_CARE_PROVIDER_SITE_OTHER): Payer: Managed Care, Other (non HMO) | Admitting: Family Medicine

## 2022-07-20 ENCOUNTER — Ambulatory Visit (INDEPENDENT_AMBULATORY_CARE_PROVIDER_SITE_OTHER): Payer: Managed Care, Other (non HMO)

## 2022-07-20 VITALS — BP 128/82 | HR 97 | Ht 65.0 in | Wt 225.0 lb

## 2022-07-20 DIAGNOSIS — G8929 Other chronic pain: Secondary | ICD-10-CM | POA: Diagnosis not present

## 2022-07-20 DIAGNOSIS — M25512 Pain in left shoulder: Secondary | ICD-10-CM

## 2022-07-20 NOTE — Patient Instructions (Addendum)
Thank you for coming in today.   I've referred you to Physical Therapy.  Let us know if you don't hear from them in one week.   Please get an Xray today before you leave   Keep me updated.   Recheck in about 6 weeks.   Let me know if this is not working.

## 2022-07-20 NOTE — Progress Notes (Signed)
   I, Peterson Lombard, LAT, ATC acting as a scribe for Lynne Leader, MD.  Subjective:    CC: L shoulder pain  HPI: Patient is a 52 year old female presenting with left shoulder pain x 6 months intermittently. Pt notes a lack of AROM.  Pt's husband has MS so she is trying to lift and transport him. Pt locates pain to all over the L shoulder.  She attended [facility] for her back pain earlier in 2023.  Radiates: slightly into upper arm UE Numbness/tingling: UE Weakness: no Aggravates: horz aBd, aBd Treatments tried: IBU,   Pertinent review of Systems: No fevers or chills  Relevant historical information: Diabetes History of papillary thyroid carcinoma  Objective:    Vitals:   07/20/22 1530  BP: 128/82  Pulse: 97  SpO2: 97%   General: Well Developed, well nourished, and in no acute distress.   MSK: Left shoulder: Normal-appearing Nontender to palpation. Normal range of motion however pain is present with shoulder abduction at about 120 degrees. Intact strength again pain is present with abduction. Positive Hawkins and Neer's test. Positive empty can test. Negative Yergason's and speeds test. Pulses and capillary fill and sensation are intact distal upper extremities.  Lab and Radiology Results  Diagnostic Limited MSK Ultrasound of: Left shoulder Biceps tendon intact normal-appearing Subscapularis tendon is intact and normal. Supraspinatus tendon is intact. Mild subacromial bursitis is present. Infraspinatus tendon is normal-appearing AC joint degenerative with effusion. Impression: Subacromial bursitis  X-ray images left shoulder obtained today personally and independently interpreted Mild AC DJD.  No acute fractures are visible. Await formal radiology review  Impression and Recommendations:    Assessment and Plan: 52 y.o. female with left shoulder pain thought to be due to subacromial impingement and bursitis.  Plan for  trial of physical therapy.  Check back in about 6 weeks.  If not better consider steroid injection.  PDMP not reviewed this encounter. Orders Placed This Encounter  Procedures   [place] LIMITED JOINT SPACE STRUCTURES UP LEFT(NO LINKED CHARGES)    Order Specific Question:   Reason for Exam (SYMPTOM  OR DIAGNOSIS REQUIRED)    Answer:   Left shoulder pain    Order Specific Question:   Preferred imaging location?    Answer:   [facility]   DG Shoulder Left    Standing Status:   Future    Number of Occurrences:   1    Standing Expiration Date:   08/18/2022    Order Specific Question:   Reason for Exam (SYMPTOM  OR DIAGNOSIS REQUIRED)    Answer:   Left shoulder pain    Order Specific Question:   Preferred imaging location?    Answer:   [facility]    Order Specific Question:   Is patient pregnant?    Answer:   No   Ambulatory referral to Physical Therapy    Referral Priority:   Routine    Referral Type:   Physical Medicine    Referral Reason:   Specialty Services Required    Requested Specialty:   Physical Therapy    Number of Visits Requested:   1   No orders of the defined types were placed in this encounter.   Discussed warning signs or symptoms. Please see discharge instructions. Patient expresses understanding.   The above documentation has been reviewed and is accurate and complete Lynne Leader, M.D.

## 2022-07-21 NOTE — Progress Notes (Signed)
Left shoulder x-ray looks normal to radiology

## 2022-07-22 ENCOUNTER — Other Ambulatory Visit: Payer: Self-pay | Admitting: Gastroenterology

## 2022-07-27 ENCOUNTER — Other Ambulatory Visit (INDEPENDENT_AMBULATORY_CARE_PROVIDER_SITE_OTHER): Payer: Managed Care, Other (non HMO)

## 2022-07-27 DIAGNOSIS — E119 Type 2 diabetes mellitus without complications: Secondary | ICD-10-CM

## 2022-07-27 DIAGNOSIS — E785 Hyperlipidemia, unspecified: Secondary | ICD-10-CM | POA: Diagnosis not present

## 2022-07-27 DIAGNOSIS — E039 Hypothyroidism, unspecified: Secondary | ICD-10-CM

## 2022-07-28 LAB — CBC WITH DIFFERENTIAL/PLATELET
Basophils Absolute: 0.1 10*3/uL (ref 0.0–0.1)
Basophils Relative: 1.1 % (ref 0.0–3.0)
Eosinophils Absolute: 0.1 10*3/uL (ref 0.0–0.7)
Eosinophils Relative: 0.9 % (ref 0.0–5.0)
HCT: 38 % (ref 36.0–46.0)
Hemoglobin: 12.7 g/dL (ref 12.0–15.0)
Lymphocytes Relative: 21.7 % (ref 12.0–46.0)
Lymphs Abs: 1.9 10*3/uL (ref 0.7–4.0)
MCHC: 33.4 g/dL (ref 30.0–36.0)
MCV: 86.5 fl (ref 78.0–100.0)
Monocytes Absolute: 0.5 10*3/uL (ref 0.1–1.0)
Monocytes Relative: 6.1 % (ref 3.0–12.0)
Neutro Abs: 6.1 10*3/uL (ref 1.4–7.7)
Neutrophils Relative %: 70.2 % (ref 43.0–77.0)
Platelets: 318 10*3/uL (ref 150.0–400.0)
RBC: 4.4 Mil/uL (ref 3.87–5.11)
RDW: 15 % (ref 11.5–15.5)
WBC: 8.7 10*3/uL (ref 4.0–10.5)

## 2022-07-28 LAB — COMPREHENSIVE METABOLIC PANEL
ALT: 51 U/L — ABNORMAL HIGH (ref 0–35)
AST: 27 U/L (ref 0–37)
Albumin: 4.4 g/dL (ref 3.5–5.2)
Alkaline Phosphatase: 63 U/L (ref 39–117)
BUN: 13 mg/dL (ref 6–23)
CO2: 27 mEq/L (ref 19–32)
Calcium: 9.1 mg/dL (ref 8.4–10.5)
Chloride: 103 mEq/L (ref 96–112)
Creatinine, Ser: 0.77 mg/dL (ref 0.40–1.20)
GFR: 90.62 mL/min (ref >60.00)
Glucose, Bld: 134 mg/dL — ABNORMAL HIGH (ref 70–99)
Potassium: 4.3 mEq/L (ref 3.5–5.1)
Sodium: 138 mEq/L (ref 135–145)
Total Bilirubin: 0.4 mg/dL (ref 0.2–1.2)
Total Protein: 6.6 g/dL (ref 6.0–8.3)

## 2022-07-28 LAB — HEMOGLOBIN A1C: Hgb A1c MFr Bld: 8.2 % — ABNORMAL HIGH (ref 4.6–6.5)

## 2022-07-28 LAB — LIPID PANEL
Cholesterol: 133 mg/dL (ref 0–200)
HDL: 35.7 mg/dL — ABNORMAL LOW (ref >39.00)
LDL Cholesterol: 67 mg/dL (ref 0–99)
NonHDL: 97.35
Total CHOL/HDL Ratio: 4
Triglycerides: 152 mg/dL — ABNORMAL HIGH (ref 0.0–149.0)
VLDL: 30.4 mg/dL (ref 0.0–40.0)

## 2022-07-28 LAB — TSH: TSH: 0.55 u[IU]/mL (ref 0.35–5.50)

## 2022-07-29 ENCOUNTER — Other Ambulatory Visit: Payer: Self-pay | Admitting: Family Medicine

## 2022-07-29 MED ORDER — TIRZEPATIDE 2.5 MG/0.5ML ~~LOC~~ SOAJ: 2.5000 mg | SUBCUTANEOUS | 1 refills | Status: DC

## 2022-07-31 ENCOUNTER — Other Ambulatory Visit: Payer: Self-pay | Admitting: Family Medicine

## 2022-08-01 ENCOUNTER — Other Ambulatory Visit: Payer: Self-pay | Admitting: Family Medicine

## 2022-08-01 ENCOUNTER — Telehealth: Payer: Self-pay

## 2022-08-01 ENCOUNTER — Other Ambulatory Visit: Payer: Self-pay

## 2022-08-01 DIAGNOSIS — E039 Hypothyroidism, unspecified: Secondary | ICD-10-CM

## 2022-08-01 DIAGNOSIS — E785 Hyperlipidemia, unspecified: Secondary | ICD-10-CM

## 2022-08-01 DIAGNOSIS — E119 Type 2 diabetes mellitus without complications: Secondary | ICD-10-CM

## 2022-08-01 NOTE — Telephone Encounter (Signed)
Called pt was advised and pt stated she understand,  She ok with it will take medication.

## 2022-08-01 NOTE — Telephone Encounter (Signed)
done 

## 2022-08-15 ENCOUNTER — Other Ambulatory Visit: Payer: Self-pay | Admitting: Family Medicine

## 2022-08-15 ENCOUNTER — Encounter: Payer: Self-pay | Admitting: Family Medicine

## 2022-08-16 ENCOUNTER — Other Ambulatory Visit: Payer: Self-pay

## 2022-08-16 MED ORDER — VENLAFAXINE HCL ER 75 MG PO CP24: 75.0000 mg | ORAL_CAPSULE | Freq: Two times a day (BID) | ORAL | 2 refills | Status: DC

## 2022-08-16 NOTE — Telephone Encounter (Signed)
Medication sent.

## 2022-08-20 ENCOUNTER — Other Ambulatory Visit: Payer: Self-pay | Admitting: Family Medicine

## 2022-09-28 ENCOUNTER — Other Ambulatory Visit: Payer: Self-pay | Admitting: Family Medicine

## 2022-10-02 ENCOUNTER — Encounter: Payer: Self-pay | Admitting: Family Medicine

## 2022-10-03 ENCOUNTER — Other Ambulatory Visit: Payer: Self-pay | Admitting: Family Medicine

## 2022-10-03 MED ORDER — TIRZEPATIDE 2.5 MG/0.5ML ~~LOC~~ SOAJ: 2.5000 mg | SUBCUTANEOUS | 2 refills | Status: DC

## 2022-10-11 ENCOUNTER — Other Ambulatory Visit: Payer: Self-pay | Admitting: Family Medicine

## 2022-10-19 ENCOUNTER — Other Ambulatory Visit: Payer: Self-pay | Admitting: Family Medicine

## 2022-11-09 NOTE — Assessment & Plan Note (Signed)
Encourage heart healthy diet such as MIND or DASH diet, increase exercise, avoid trans fats, simple carbohydrates and processed foods, consider a krill or fish or flaxseed oil cap daily.  °

## 2022-11-10 ENCOUNTER — Ambulatory Visit (INDEPENDENT_AMBULATORY_CARE_PROVIDER_SITE_OTHER): Payer: Managed Care, Other (non HMO) | Admitting: Family Medicine

## 2022-11-10 VITALS — BP 126/78 | HR 97 | Temp 98.1°F | Resp 16 | Ht 65.0 in | Wt 211.6 lb

## 2022-11-10 DIAGNOSIS — E039 Hypothyroidism, unspecified: Secondary | ICD-10-CM | POA: Diagnosis not present

## 2022-11-10 DIAGNOSIS — E669 Obesity, unspecified: Secondary | ICD-10-CM

## 2022-11-10 DIAGNOSIS — E785 Hyperlipidemia, unspecified: Secondary | ICD-10-CM | POA: Diagnosis not present

## 2022-11-10 DIAGNOSIS — J45909 Unspecified asthma, uncomplicated: Secondary | ICD-10-CM | POA: Diagnosis not present

## 2022-11-10 DIAGNOSIS — Z7985 Long-term (current) use of injectable non-insulin antidiabetic drugs: Secondary | ICD-10-CM

## 2022-11-10 DIAGNOSIS — E119 Type 2 diabetes mellitus without complications: Secondary | ICD-10-CM | POA: Diagnosis not present

## 2022-11-10 MED ORDER — TIRZEPATIDE 2.5 MG/0.5ML ~~LOC~~ SOAJ: 2.5000 mg | SUBCUTANEOUS | 2 refills | Status: DC

## 2022-11-10 NOTE — Progress Notes (Signed)
Subjective:    Patient ID: Kristina Watts, female    DOB: 08/07/1970, 52 y.o.   MRN: [id number]  Chief Complaint  Patient presents with   Follow-up    Follow up    HPI Patient is in today for follow up on chronic medical concerns. No recent febrile illness or hospitalizations. Denies CP/palp/SOB/HA/congestion/fevers/GI or GU c/o. Taking meds as prescribed, she is tolerating Mounjaro with good appetite suppression. No concerning side effects.   Past Medical History:  Diagnosis Date   Acute bronchitis 11/19/2012   ADD (attention deficit disorder) 04/27/2012   Allergy    seasonal   Asthma    Back pain 11/07/2013   Cancer [facility])    thyroid- March 2023   Cervical cancer screening 12/23/2014   Chicken pox as a child   Depression with anxiety 04/18/2011   Dermatitis 05/18/2011   Diabetes mellitus without complication (HCC)    GERD (gastroesophageal reflux disease)    History of gestational diabetes mellitus (GDM) 04/19/2011   History of pancreatitis 04/19/2011   Hyperlipidemia 04/27/2012   LUQ pain 12/12/2011   Migraine 08/23/2011   Otitis media of right ear 05/30/2011   Overweight(278.02) 04/19/2011   Pancreatitis, recurrent 12/12/2011   Pharyngitis 08/23/2011   Pneumonia 1998   Preventative health care 04/19/2011   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 Was done due to pancreatitis   THYROIDECTOMY N/A 08/27/2021   Procedure: TOTAL THYROIDECTOMY;  Surgeon: Kinsinger, De Blanch, MD;  Location: [facility];  Service: General;  Laterality: N/A;   UPPER GASTROINTESTINAL ENDOSCOPY      Family History  Problem Relation Age of Onset   Thyroid disease Mother    Colon polyps Mother    Hypertension Father     Hyperlipidemia Father    Heart disease Father    Diabetes Father    COPD Father        previous smoker   Stroke Father    Depression Father    Restless legs syndrome Father    Colon polyps Father    Diverticulosis Father    Diabetes Brother    Cancer Maternal Grandmother        Head and Neck   Heart disease Paternal Grandmother    Hyperlipidemia Paternal Grandmother    Hypertension Paternal Grandmother    Diabetes Paternal Grandmother    Depression Paternal Grandmother    Stroke Paternal Grandmother    Restless legs syndrome Paternal Grandmother    Heart disease Paternal Grandfather    Hyperlipidemia Paternal Grandfather    Hypertension Paternal Grandfather    Stroke Paternal Grandfather    Diabetes Paternal Grandfather    Colon cancer Neg Hx    Esophageal cancer Neg Hx    Rectal cancer Neg Hx    Stomach cancer Neg Hx     Social History   Socioeconomic History   Marital status: Married    Spouse name: Not on file   Number of children: Not on file   Years of education: Not on file   Highest education level: Not on file  Occupational History   Not on file  Tobacco Use   Smoking status: Never   Smokeless tobacco: Never  Vaping Use   Vaping  Use: Never used  Substance and Sexual Activity   Alcohol use: Yes    Comment: 1/week   Drug use: No   Sexual activity: Not Currently    Partners: Male    Comment: abstinence  Other Topics Concern   Not on file  Social History Narrative   Not on file   Social Determinants of Health   Financial Resource Strain: Not on file  Food Insecurity: Not on file  Transportation Needs: Not on file  Physical Activity: Not on file  Stress: Not on file  Social Connections: Not on file  Intimate Partner Violence: Not on file    Outpatient Medications Prior to Visit  Medication Sig Dispense Refill   acetaminophen (TYLENOL) 500 MG tablet Take 1,000 mg by mouth every 6 (six) hours as needed for moderate pain.      amphetamine-dextroamphetamine (ADDERALL) 10 MG tablet Take 1 tablet (10 mg total) by mouth daily as needed (focus). July 2023 30 tablet 0   amphetamine-dextroamphetamine (ADDERALL) 10 MG tablet Take 1 tablet (10 mg total) by mouth daily as needed. August 2023 30 tablet 0   amphetamine-dextroamphetamine (ADDERALL) 10 MG tablet Take 1 tablet (10 mg total) by mouth daily as needed. September 2023 30 tablet 0   Blood Glucose Monitoring Suppl (ONETOUCH VERIO REFLECT) w/Device KIT USE TO CHECK SUGAR ONCE A DAY AND AS NEEDED. DX CODE: E11.9 1 kit 0   buPROPion (WELLBUTRIN XL) 150 MG 24 hr tablet TAKE 1 TABLET BY MOUTH EVERY DAY 90 tablet 1   busPIRone (BUSPAR) 5 MG tablet Take 1 tablet (5 mg total) by mouth 2 (two) times daily as needed. 40 tablet 1   Butalbital-Acetaminophen (BUTALBITAL-APAP) 50-325 MG TABS Take 1 tablet by mouth 3 (three) times daily as needed. 60 each 0   famotidine (PEPCID) 40 MG tablet TAKE 1 TABLET BY MOUTH EVERY DAY 90 tablet 1   ibuprofen (ADVIL,MOTRIN) 200 MG tablet Take 400 mg by mouth every 6 (six) hours as needed for headache or moderate pain.     JARDIANCE 10 MG TABS tablet TAKE 1 TABLET BY MOUTH EVERY DAY 30 tablet 1   ketoconazole (NIZORAL) 2 % cream Apply 1 application topically daily. (Patient taking differently: Apply 1 application  topically daily as needed for irritation.) 30 g 1   Lancets (ONETOUCH DELICA PLUS LANCET33G) MISC Use to check sugar once a day and as needed.  Dx code: E11.9 100 each 1   levocetirizine (XYZAL) 5 MG tablet Take 5 mg by mouth every evening.     levothyroxine (SYNTHROID) 200 MCG tablet TAKE 1 TABLET BY MOUTH EVERY DAY 90 tablet 1   melatonin 3 MG TABS tablet Take 3 mg by mouth at bedtime.     metFORMIN (GLUCOPHAGE) 1000 MG tablet TAKE 1 TABLET (1,000 MG TOTAL) BY MOUTH TWICE A DAY WITH FOOD 180 tablet 1   montelukast (SINGULAIR) 10 MG tablet Take 1 tablet (10 mg total) by mouth at bedtime. (Patient taking differently: Take 10 mg by mouth at  bedtime as needed (allergies in spring).) 90 tablet 0   Multiple Vitamin (MULTIVITAMIN) tablet Take 1 tablet by mouth daily.     omeprazole (PRILOSEC) 40 MG capsule TAKE 1 CAPSULE (40 MG TOTAL) BY MOUTH DAILY. 90 capsule 1   ONETOUCH VERIO test strip USE TO CHECK SUGAR TWICE A DAY AND AS NEEDED. DX CODE: E11.65 100 strip 12   promethazine (PHENERGAN) 25 MG tablet Take 1 tablet (25 mg total) by mouth every 8 (eight) hours  as needed for nausea or vomiting. 30 tablet 1   SYMBICORT 160-4.5 MCG/ACT inhaler TAKE 2 PUFFS BY MOUTH TWICE A DAY (Patient taking differently: Inhale 2 puffs into the lungs 2 (two) times daily as needed (shortness of breath or wheezing in the spring).) 10.2 g 11   triamcinolone ointment (KENALOG) 0.1 % Apply 1 Application topically at bedtime. 80 g 1   valACYclovir (VALTREX) 1000 MG tablet TAKE 1 TABLET BY MOUTH TWICE A DAY (Patient taking differently: as needed.) 180 tablet 1   venlafaxine XR (EFFEXOR-XR) 75 MG 24 hr capsule Take 1 capsule (75 mg total) by mouth 2 (two) times daily with breakfast and lunch. 180 capsule 2   tirzepatide (MOUNJARO) 2.5 MG/0.5ML Pen Inject 2.5 mg into the skin once a week. 2 mL 2   No facility-administered medications prior to visit.    Allergies  Allergen Reactions   Actifed [Triprolidine-Pse] Hives    Review of Systems  Constitutional:  Negative for fever and malaise/fatigue.  HENT:  Negative for congestion.   Eyes:  Negative for blurred vision.  Respiratory:  Negative for shortness of breath.   Cardiovascular:  Negative for chest pain, palpitations and leg swelling.  Gastrointestinal:  Negative for abdominal pain, blood in stool and nausea.  Genitourinary:  Negative for dysuria and frequency.  Musculoskeletal:  Negative for falls.  Skin:  Negative for rash.  Neurological:  Negative for dizziness, loss of consciousness and headaches.  Endo/Heme/Allergies:  Negative for environmental allergies.  Psychiatric/Behavioral:  Negative for  depression. The patient is not nervous/anxious.        Objective:    Physical Exam Constitutional:      General: She is not in acute distress.    Appearance: Normal appearance. She is well-developed. She is not toxic-appearing.  HENT:     Head: Normocephalic and atraumatic.     Right Ear: External ear normal.     Left Ear: External ear normal.     Nose: Nose normal.  Eyes:     General:        Right eye: No discharge.        Left eye: No discharge.     Conjunctiva/sclera: Conjunctivae normal.  Neck:     Thyroid: No thyromegaly.  Cardiovascular:     Rate and Rhythm: Normal rate and regular rhythm.     Heart sounds: Normal heart sounds. No murmur heard. Pulmonary:     Effort: Pulmonary effort is normal. No respiratory distress.     Breath sounds: Normal breath sounds.  Abdominal:     General: Bowel sounds are normal.     Palpations: Abdomen is soft.     Tenderness: There is no abdominal tenderness. There is no guarding.  Musculoskeletal:        General: Normal range of motion.     Cervical back: Neck supple.  Lymphadenopathy:     Cervical: No cervical adenopathy.  Skin:    General: Skin is warm and dry.  Neurological:     Mental Status: She is alert and oriented to person, place, and time.  Psychiatric:        Mood and Affect: Mood normal.        Behavior: Behavior normal.        Thought Content: Thought content normal.        Judgment: Judgment normal.     BP 126/78 (BP Location: Right Arm, Patient Position: Sitting, Cuff Size: Normal)   Pulse 97   Temp 98.1 F (36.7 C) (  Oral)   Resp 16   Ht 5\' 5"  (1.651 m)   Wt 211 lb 9.6 oz (96 kg)   SpO2 96%   BMI 35.21 kg/m  Wt Readings from Last 3 Encounters:  11/10/22 211 lb 9.6 oz (96 kg)  07/20/22 225 lb (102.1 kg)  07/12/22 224 lb 3.2 oz (101.7 kg)    Diabetic Foot Exam - Simple   No data filed    Lab Results  Component Value Date   WBC 9.6 11/10/2022   HGB 12.5 11/10/2022   HCT 37.8 11/10/2022   PLT  401.0 (H) 11/10/2022   GLUCOSE 96 11/10/2022   CHOL 130 11/10/2022   TRIG 113.0 11/10/2022   HDL 43.30 11/10/2022   LDLDIRECT 104.0 04/25/2022   LDLCALC 64 11/10/2022   ALT 45 (H) 11/10/2022   AST 27 11/10/2022   NA 139 11/10/2022   K 4.2 11/10/2022   CL 103 11/10/2022   CREATININE 0.70 11/10/2022   BUN 18 11/10/2022   CO2 26 11/10/2022   TSH 0.34 (L) 11/10/2022   HGBA1C 6.2 11/10/2022   MICROALBUR 1.4 11/10/2022    Lab Results  Component Value Date   TSH 0.34 (L) 11/10/2022   Lab Results  Component Value Date   WBC 9.6 11/10/2022   HGB 12.5 11/10/2022   HCT 37.8 11/10/2022   MCV 85.4 11/10/2022   PLT 401.0 (H) 11/10/2022   Lab Results  Component Value Date   NA 139 11/10/2022   K 4.2 11/10/2022   CO2 26 11/10/2022   GLUCOSE 96 11/10/2022   BUN 18 11/10/2022   CREATININE 0.70 11/10/2022   BILITOT 0.4 11/10/2022   ALKPHOS 63 11/10/2022   AST 27 11/10/2022   ALT 45 (H) 11/10/2022   PROT 7.3 11/10/2022   ALBUMIN 4.5 11/10/2022   CALCIUM 9.0 11/10/2022   ANIONGAP 8 08/28/2021   GFR 101.39 11/10/2022   Lab Results  Component Value Date   CHOL 130 11/10/2022   Lab Results  Component Value Date   HDL 43.30 11/10/2022   Lab Results  Component Value Date   LDLCALC 64 11/10/2022   Lab Results  Component Value Date   TRIG 113.0 11/10/2022   Lab Results  Component Value Date   CHOLHDL 3 11/10/2022   Lab Results  Component Value Date   HGBA1C 6.2 11/10/2022       Assessment & Plan:  Hyperlipidemia, unspecified hyperlipidemia type Assessment & Plan: Encourage heart healthy diet such as MIND or DASH diet, increase exercise, avoid trans fats, simple carbohydrates and processed foods, consider a krill or fish or flaxseed oil cap daily.    Orders: -     Lipid panel  Obesity (BMI 30-39.9) Assessment & Plan: Tolerating Mounjaro 2.5 mg with good response will not increase dosing. Refill given  Orders: -     CBC with  Differential/Platelet  Hypothyroidism, unspecified type -     TSH  Diabetes mellitus without complication (HCC) -     Hemoglobin A1c -     Comprehensive metabolic panel -     Microalbumin / creatinine urine ratio  Uncomplicated asthma, unspecified asthma severity, unspecified whether persistent -     CBC with Differential/Platelet  Other orders -     Tirzepatide; Inject 2.5 mg into the skin once a week.  Dispense: 2 mL; Refill: 2    Danise Edge, MD

## 2022-11-11 LAB — CBC WITH DIFFERENTIAL/PLATELET
Basophils Absolute: 0.1 10*3/uL (ref 0.0–0.1)
Basophils Relative: 0.7 % (ref 0.0–3.0)
Eosinophils Absolute: 0.2 10*3/uL (ref 0.0–0.7)
Eosinophils Relative: 1.7 % (ref 0.0–5.0)
HCT: 37.8 % (ref 36.0–46.0)
Hemoglobin: 12.5 g/dL (ref 12.0–15.0)
Lymphocytes Relative: 19.1 % (ref 12.0–46.0)
Lymphs Abs: 1.8 10*3/uL (ref 0.7–4.0)
MCHC: 33.1 g/dL (ref 30.0–36.0)
MCV: 85.4 fl (ref 78.0–100.0)
Monocytes Absolute: 0.6 10*3/uL (ref 0.1–1.0)
Monocytes Relative: 6 % (ref 3.0–12.0)
Neutro Abs: 7 10*3/uL (ref 1.4–7.7)
Neutrophils Relative %: 72.5 % (ref 43.0–77.0)
Platelets: 401 10*3/uL — ABNORMAL HIGH (ref 150.0–400.0)
RBC: 4.43 Mil/uL (ref 3.87–5.11)
RDW: 13.9 % (ref 11.5–15.5)
WBC: 9.6 10*3/uL (ref 4.0–10.5)

## 2022-11-11 LAB — LIPID PANEL
Cholesterol: 130 mg/dL (ref 0–200)
HDL: 43.3 mg/dL (ref >39.00)
LDL Cholesterol: 64 mg/dL (ref 0–99)
NonHDL: 86.33
Total CHOL/HDL Ratio: 3
Triglycerides: 113 mg/dL (ref 0.0–149.0)
VLDL: 22.6 mg/dL (ref 0.0–40.0)

## 2022-11-11 LAB — COMPREHENSIVE METABOLIC PANEL
ALT: 45 U/L — ABNORMAL HIGH (ref 0–35)
AST: 27 U/L (ref 0–37)
Albumin: 4.5 g/dL (ref 3.5–5.2)
Alkaline Phosphatase: 63 U/L (ref 39–117)
BUN: 18 mg/dL (ref 6–23)
CO2: 26 mEq/L (ref 19–32)
Calcium: 9 mg/dL (ref 8.4–10.5)
Chloride: 103 mEq/L (ref 96–112)
Creatinine, Ser: 0.7 mg/dL (ref 0.40–1.20)
GFR: 101.39 mL/min (ref >60.00)
Glucose, Bld: 96 mg/dL (ref 70–99)
Potassium: 4.2 mEq/L (ref 3.5–5.1)
Sodium: 139 mEq/L (ref 135–145)
Total Bilirubin: 0.4 mg/dL (ref 0.2–1.2)
Total Protein: 7.3 g/dL (ref 6.0–8.3)

## 2022-11-11 LAB — MICROALBUMIN / CREATININE URINE RATIO
Creatinine,U: 39.5 mg/dL
Microalb Creat Ratio: 3.6 mg/g (ref 0.0–30.0)
Microalb, Ur: 1.4 mg/dL (ref 0.0–1.9)

## 2022-11-11 LAB — TSH: TSH: 0.34 u[IU]/mL — ABNORMAL LOW (ref 0.35–5.50)

## 2022-11-11 LAB — HEMOGLOBIN A1C: Hgb A1c MFr Bld: 6.2 % (ref 4.6–6.5)

## 2022-11-14 NOTE — Assessment & Plan Note (Signed)
Tolerating Mounjaro 2.5 mg with good response will not increase dosing. Refill given

## 2022-11-15 ENCOUNTER — Other Ambulatory Visit: Payer: Self-pay

## 2022-11-15 MED ORDER — LEVOTHYROXINE SODIUM 200 MCG PO TABS: ORAL_TABLET | ORAL | 1 refills | Status: DC

## 2022-12-10 ENCOUNTER — Other Ambulatory Visit: Payer: Self-pay | Admitting: Family Medicine

## 2022-12-17 ENCOUNTER — Other Ambulatory Visit: Payer: Self-pay | Admitting: Family Medicine

## 2023-01-06 ENCOUNTER — Other Ambulatory Visit: Payer: Self-pay | Admitting: Family Medicine

## 2023-01-06 ENCOUNTER — Encounter: Payer: Self-pay | Admitting: Family Medicine

## 2023-01-06 MED ORDER — TIRZEPATIDE 5 MG/0.5ML ~~LOC~~ SOAJ
5.0000 mg | SUBCUTANEOUS | 1 refills | Status: DC
Start: 2023-01-06 — End: 2023-06-12

## 2023-01-18 ENCOUNTER — Encounter (INDEPENDENT_AMBULATORY_CARE_PROVIDER_SITE_OTHER): Payer: Self-pay

## 2023-01-26 ENCOUNTER — Other Ambulatory Visit: Payer: Self-pay | Admitting: Family Medicine

## 2023-02-15 NOTE — Assessment & Plan Note (Signed)
Encouraged increased hydration, 64 ounces of clear fluids daily. Minimize alcohol and caffeine. Eat small frequent meals with lean proteins and complex carbs. Avoid high and low blood sugars. Get adequate sleep, 7-8 hours a night. Needs exercise daily preferably in the morning.  

## 2023-02-15 NOTE — Assessment & Plan Note (Signed)
Tolerating Adderall

## 2023-02-15 NOTE — Assessment & Plan Note (Signed)
Encourage heart healthy diet such as MIND or DASH diet, increase exercise, avoid trans fats, simple carbohydrates and processed foods, consider a krill or fish or flaxseed oil cap daily.  °

## 2023-02-15 NOTE — Assessment & Plan Note (Signed)
hgba1c acceptable, minimize simple carbs. Increase exercise as tolerated. Continue current meds 

## 2023-02-15 NOTE — Assessment & Plan Note (Signed)
On Levothyroxine, continue to monitor 

## 2023-02-16 ENCOUNTER — Telehealth (INDEPENDENT_AMBULATORY_CARE_PROVIDER_SITE_OTHER): Payer: Managed Care, Other (non HMO) | Admitting: Family Medicine

## 2023-02-16 ENCOUNTER — Encounter: Payer: Self-pay | Admitting: Family Medicine

## 2023-02-16 ENCOUNTER — Ambulatory Visit: Payer: Managed Care, Other (non HMO) | Admitting: Family Medicine

## 2023-02-16 VITALS — Ht 65.0 in | Wt 204.0 lb

## 2023-02-16 DIAGNOSIS — E785 Hyperlipidemia, unspecified: Secondary | ICD-10-CM

## 2023-02-16 DIAGNOSIS — E039 Hypothyroidism, unspecified: Secondary | ICD-10-CM

## 2023-02-16 DIAGNOSIS — F988 Other specified behavioral and emotional disorders with onset usually occurring in childhood and adolescence: Secondary | ICD-10-CM

## 2023-02-16 DIAGNOSIS — F418 Other specified anxiety disorders: Secondary | ICD-10-CM

## 2023-02-16 DIAGNOSIS — G43009 Migraine without aura, not intractable, without status migrainosus: Secondary | ICD-10-CM

## 2023-02-16 DIAGNOSIS — E119 Type 2 diabetes mellitus without complications: Secondary | ICD-10-CM | POA: Diagnosis not present

## 2023-02-16 MED ORDER — AMPHETAMINE-DEXTROAMPHETAMINE 10 MG PO TABS: 10.0000 mg | ORAL_TABLET | Freq: Every day | ORAL | 0 refills | Status: DC | PRN

## 2023-02-16 MED ORDER — AMPHETAMINE-DEXTROAMPHETAMINE 10 MG PO TABS
10.0000 mg | ORAL_TABLET | Freq: Every day | ORAL | 0 refills | Status: DC | PRN
Start: 2023-02-16 — End: 2023-06-10

## 2023-02-16 MED ORDER — AMPHETAMINE-DEXTROAMPHETAMINE 10 MG PO TABS
10.0000 mg | ORAL_TABLET | Freq: Every day | ORAL | 0 refills | Status: DC | PRN
Start: 2023-02-16 — End: 2023-08-21

## 2023-02-19 ENCOUNTER — Encounter: Payer: Self-pay | Admitting: Family Medicine

## 2023-02-20 NOTE — Progress Notes (Signed)
MyChart Video Visit    Virtual Visit via Video Note   This patient is at least at moderate risk for complications without adequate follow up. This format is felt to be most appropriate for this patient at this time. Physical exam was limited by quality of the video and audio technology used for the visit. [name], CMA was able to get the patient set up on a video visit.  Patient location: work Patient and provider in visit Provider location: Office  I discussed the limitations of evaluation and management by telemedicine and the availability of in person appointments. The patient expressed understanding and agreed to proceed.  Visit Date: 02/16/2023  Today's healthcare provider: Danise Edge, MD     Subjective:    Patient ID: Kristina Watts, female    DOB: 1971-01-09, 52 y.o.   MRN: [id number]  Chief Complaint  Patient presents with   Follow-up    3 month, no concerns    HPI Discussed the use of AI scribe software for clinical note transcription with the patient, who gave verbal consent to proceed.  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 She has a history of thyroid issues, high cholesterol, and diabetes. She occasionally takes Adderall, particularly at the start of the school year. She has been dealing with acid reflux and has been on omeprazole, which she has started to taper off. She has not reported any recent trouble with migraines. She has not requested any refills on her Adderall prescription, as she still has some left. Her greatest stress is her mother who has become increasingly forgetful and agitated. she wonders if her mother should restart Venlafaxine.        Past Medical History:  Diagnosis Date   Acute bronchitis 11/19/2012   ADD (attention deficit disorder) 04/27/2012   Allergy    seasonal   Asthma    Back pain 11/07/2013   Cancer [facility])    thyroid- March 2023    Cervical cancer screening 12/23/2014   Chicken pox as a child   Depression with anxiety 04/18/2011   Dermatitis 05/18/2011   Diabetes mellitus without complication (HCC)    GERD (gastroesophageal reflux disease)    History of gestational diabetes mellitus (GDM) 04/19/2011   History of pancreatitis 04/19/2011   Hyperlipidemia 04/27/2012   LUQ pain 12/12/2011   Migraine 08/23/2011   Otitis media of right ear 05/30/2011   Overweight(278.02) 04/19/2011   Pancreatitis, recurrent 12/12/2011   Pharyngitis 08/23/2011   Pneumonia 1998   Preventative health care 04/19/2011   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 Was done due to pancreatitis   THYROIDECTOMY N/A 08/27/2021   Procedure: TOTAL THYROIDECTOMY;  Surgeon: Kinsinger, De Blanch, MD;  Location: [facility];  Service: General;  Laterality: N/A;   UPPER GASTROINTESTINAL ENDOSCOPY      Family History  Problem Relation Age of Onset   Thyroid disease Mother    Colon polyps Mother    Hypertension Father    Hyperlipidemia Father    Heart disease Father    Diabetes Father    COPD Father        previous smoker  Stroke Father    Depression Father    Restless legs syndrome Father    Colon polyps Father    Diverticulosis Father    Diabetes Brother    Cancer Maternal Grandmother        Head and Neck   Heart disease Paternal Grandmother    Hyperlipidemia Paternal Grandmother    Hypertension Paternal Grandmother    Diabetes Paternal Grandmother    Depression Paternal Grandmother    Stroke Paternal Grandmother    Restless legs syndrome Paternal Grandmother    Heart disease Paternal Grandfather    Hyperlipidemia Paternal Grandfather    Hypertension Paternal  Grandfather    Stroke Paternal Grandfather    Diabetes Paternal Grandfather    Colon cancer Neg Hx    Esophageal cancer Neg Hx    Rectal cancer Neg Hx    Stomach cancer Neg Hx     Social History   Socioeconomic History   Marital status: Married    Spouse name: Not on file   Number of children: Not on file   Years of education: Not on file   Highest education level: Not on file  Occupational History   Not on file  Tobacco Use   Smoking status: Never   Smokeless tobacco: Never  Vaping Use   Vaping status: Never Used  Substance and Sexual Activity   Alcohol use: Yes    Comment: 1/week   Drug use: No   Sexual activity: Not Currently    Partners: Male    Comment: abstinence  Other Topics Concern   Not on file  Social History Narrative   Not on file   Social Determinants of Health   Financial Resource Strain: Not on file  Food Insecurity: Not on file  Transportation Needs: Not on file  Physical Activity: Not on file  Stress: Not on file  Social Connections: Not on file  Intimate Partner Violence: Not on file    Outpatient Medications Prior to Visit  Medication Sig Dispense Refill   acetaminophen (TYLENOL) 500 MG tablet Take 1,000 mg by mouth every 6 (six) hours as needed for moderate pain.     Blood Glucose Monitoring Suppl (ONETOUCH VERIO REFLECT) w/Device KIT USE TO CHECK SUGAR ONCE A DAY AND AS NEEDED. DX CODE: E11.9 1 kit 0   buPROPion (WELLBUTRIN XL) 150 MG 24 hr tablet TAKE 1 TABLET BY MOUTH EVERY DAY 90 tablet 1   busPIRone (BUSPAR) 5 MG tablet Take 1 tablet (5 mg total) by mouth 2 (two) times daily as needed. 40 tablet 1   Butalbital-Acetaminophen (BUTALBITAL-APAP) 50-325 MG TABS Take 1 tablet by mouth 3 (three) times daily as needed. 60 each 0   empagliflozin (JARDIANCE) 10 MG TABS tablet Take 1 tablet (10 mg total) by mouth daily. 90 tablet 0   famotidine (PEPCID) 40 MG tablet TAKE 1 TABLET BY MOUTH EVERY DAY 90 tablet 1   ibuprofen (ADVIL,MOTRIN) 200 MG  tablet Take 400 mg by mouth every 6 (six) hours as needed for headache or moderate pain.     ketoconazole (NIZORAL) 2 % cream Apply 1 application topically daily. (Patient taking differently: Apply 1 application  topically daily as needed for irritation.) 30 g 1   Lancets (ONETOUCH DELICA PLUS LANCET33G) MISC Use to check sugar once a day and as needed.  Dx code: E11.9 100 each 1   levocetirizine (XYZAL) 5 MG tablet Take 5 mg by mouth every evening.     levothyroxine (SYNTHROID) 200 MCG tablet Take 1  tablet by mouth 6 days a week. 90 tablet 1   melatonin 3 MG TABS tablet Take 3 mg by mouth at bedtime.     metFORMIN (GLUCOPHAGE) 1000 MG tablet TAKE 1 TABLET (1,000 MG TOTAL) BY MOUTH TWICE A DAY WITH FOOD 180 tablet 1   montelukast (SINGULAIR) 10 MG tablet Take 1 tablet (10 mg total) by mouth at bedtime. (Patient taking differently: Take 10 mg by mouth at bedtime as needed (allergies in spring).) 90 tablet 0   Multiple Vitamin (MULTIVITAMIN) tablet Take 1 tablet by mouth daily.     omeprazole (PRILOSEC) 40 MG capsule TAKE 1 CAPSULE (40 MG TOTAL) BY MOUTH DAILY. 90 capsule 1   ONETOUCH VERIO test strip USE TO CHECK SUGAR TWICE A DAY AND AS NEEDED. DX CODE: E11.65 100 strip 12   promethazine (PHENERGAN) 25 MG tablet Take 1 tablet (25 mg total) by mouth every 8 (eight) hours as needed for nausea or vomiting. 30 tablet 1   SYMBICORT 160-4.5 MCG/ACT inhaler TAKE 2 PUFFS BY MOUTH TWICE A DAY (Patient taking differently: Inhale 2 puffs into the lungs 2 (two) times daily as needed (shortness of breath or wheezing in the spring).) 10.2 g 11   tirzepatide (MOUNJARO) 5 MG/0.5ML Pen Inject 5 mg into the skin once a week. 6 mL 1   triamcinolone ointment (KENALOG) 0.1 % Apply 1 Application topically at bedtime. 80 g 1   valACYclovir (VALTREX) 1000 MG tablet TAKE 1 TABLET BY MOUTH TWICE A DAY (Patient taking differently: as needed.) 180 tablet 1   venlafaxine XR (EFFEXOR-XR) 75 MG 24 hr capsule Take 1 capsule (75 mg  total) by mouth 2 (two) times daily with breakfast and lunch. 180 capsule 2   amphetamine-dextroamphetamine (ADDERALL) 10 MG tablet Take 1 tablet (10 mg total) by mouth daily as needed (focus). July 2023 30 tablet 0   amphetamine-dextroamphetamine (ADDERALL) 10 MG tablet Take 1 tablet (10 mg total) by mouth daily as needed. August 2023 30 tablet 0   amphetamine-dextroamphetamine (ADDERALL) 10 MG tablet Take 1 tablet (10 mg total) by mouth daily as needed. September 2023 30 tablet 0   No facility-administered medications prior to visit.    Allergies  Allergen Reactions   Actifed [Triprolidine-Pse] Hives    Review of Systems  Constitutional:  Positive for malaise/fatigue. Negative for fever.  HENT:  Negative for congestion.   Eyes:  Negative for blurred vision.  Respiratory:  Negative for shortness of breath.   Cardiovascular:  Negative for chest pain, palpitations and leg swelling.  Gastrointestinal:  Negative for abdominal pain, blood in stool and nausea.  Genitourinary:  Negative for dysuria and frequency.  Musculoskeletal:  Negative for falls.  Skin:  Negative for rash.  Neurological:  Negative for dizziness, loss of consciousness and headaches.  Endo/Heme/Allergies:  Negative for environmental allergies.  Psychiatric/Behavioral:  Negative for depression. The patient is not nervous/anxious.        Objective:    Physical Exam Constitutional:      General: She is not in acute distress.    Appearance: Normal appearance. She is not ill-appearing or toxic-appearing.  HENT:     Head: Normocephalic and atraumatic.     Right Ear: External ear normal.     Left Ear: External ear normal.     Nose: Nose normal.  Eyes:     General:        Right eye: No discharge.        Left eye: No discharge.  Pulmonary:  Effort: Pulmonary effort is normal.  Skin:    Findings: No rash.  Neurological:     Mental Status: She is alert and oriented to person, place, and time.  Psychiatric:         Behavior: Behavior normal.     Ht 5\' 5"  (1.651 m)   Wt 204 lb (92.5 kg)   BMI 33.95 kg/m  Wt Readings from Last 3 Encounters:  02/16/23 204 lb (92.5 kg)  11/10/22 211 lb 9.6 oz (96 kg)  07/20/22 225 lb (102.1 kg)       Assessment & Plan:  Attention deficit disorder, unspecified hyperactivity presence Assessment & Plan: Tolerating Adderall  Orders: -     Amphetamine-Dextroamphetamine; Take 1 tablet (10 mg total) by mouth daily as needed (focus). September 2024  Dispense: 30 tablet; Refill: 0 -     Amphetamine-Dextroamphetamine; Take 1 tablet (10 mg total) by mouth daily as needed. October 2024  Dispense: 30 tablet; Refill: 0  Depression with anxiety  Diabetes mellitus without complication (HCC) Assessment & Plan: hgba1c acceptable, minimize simple carbs. Increase exercise as tolerated. Continue current meds   Orders: -     Hemoglobin A1c; Future -     Microalbumin / creatinine urine ratio; Future  Hyperlipidemia, unspecified hyperlipidemia type Assessment & Plan: Encourage heart healthy diet such as MIND or DASH diet, increase exercise, avoid trans fats, simple carbohydrates and processed foods, consider a krill or fish or flaxseed oil cap daily.    Orders: -     Lipid panel; Future -     Comprehensive metabolic panel; Future -     CBC with Differential/Platelet; Future  Hypothyroidism, unspecified type Assessment & Plan: On Levothyroxine, continue to monitor   Orders: -     TSH; Future  Migraine without aura and without status migrainosus, not intractable Assessment & Plan: Encouraged increased hydration, 64 ounces of clear fluids daily. Minimize alcohol and caffeine. Eat small frequent meals with lean proteins and complex carbs. Avoid high and low blood sugars. Get adequate sleep, 7-8 hours a night. Needs exercise daily preferably in the morning.   Orders: -     CBC with Differential/Platelet; Future  Other orders -     Amphetamine-Dextroamphetamine;  Take 1 tablet (10 mg total) by mouth daily as needed. November 2024  Dispense: 30 tablet; Refill: 0     Assessment and Plan    Hypothyroidism Slightly elevated TSH on last check. -Order repeat TSH.  Hyperlipidemia and Diabetes Routine monitoring. -Order lipid panel, A1C, and basic metabolic panel.  ADHD Reports occasional use of Adderall. -Refill Adderall for September, October, and November.  Gastroesophageal Reflux Disease Attempting to decrease omeprazole dose. -Continue current plan to decrease omeprazole dose.  General Health Maintenance -Order urine microalbumin to monitor kidney function. -Schedule physical exam for late January or February. -Discussed potential for flu and COVID booster in the fall.         I discussed the assessment and treatment plan with the patient. The patient was provided an opportunity to ask questions and all were answered. The patient agreed with the plan and demonstrated an understanding of the instructions.   The patient was advised to call back or seek an in-person evaluation if the symptoms worsen or if the condition fails to improve as anticipated.  Danise Edge, MD [facility] [phone] (phone) [phone] (fax)  [facility]

## 2023-03-27 ENCOUNTER — Other Ambulatory Visit: Payer: Self-pay | Admitting: Family Medicine

## 2023-04-08 ENCOUNTER — Other Ambulatory Visit: Payer: Self-pay | Admitting: Family Medicine

## 2023-04-19 ENCOUNTER — Other Ambulatory Visit: Payer: Self-pay | Admitting: Family Medicine

## 2023-04-26 ENCOUNTER — Other Ambulatory Visit: Payer: Self-pay | Admitting: Family Medicine

## 2023-06-10 ENCOUNTER — Encounter: Payer: Self-pay | Admitting: Family Medicine

## 2023-06-10 ENCOUNTER — Other Ambulatory Visit: Payer: Self-pay | Admitting: Family Medicine

## 2023-06-10 DIAGNOSIS — F909 Attention-deficit hyperactivity disorder, unspecified type: Secondary | ICD-10-CM

## 2023-06-10 DIAGNOSIS — F988 Other specified behavioral and emotional disorders with onset usually occurring in childhood and adolescence: Secondary | ICD-10-CM

## 2023-06-12 ENCOUNTER — Other Ambulatory Visit: Payer: Self-pay | Admitting: Emergency Medicine

## 2023-06-12 ENCOUNTER — Other Ambulatory Visit: Payer: Self-pay | Admitting: Family Medicine

## 2023-06-12 MED ORDER — AMPHETAMINE-DEXTROAMPHETAMINE 10 MG PO TABS
10.0000 mg | ORAL_TABLET | Freq: Every day | ORAL | 0 refills | Status: DC | PRN
Start: 2023-06-12 — End: 2023-08-21

## 2023-06-12 MED ORDER — ALBUTEROL SULFATE HFA 108 (90 BASE) MCG/ACT IN AERS: 2.0000 | INHALATION_SPRAY | Freq: Four times a day (QID) | RESPIRATORY_TRACT | 1 refills | Status: AC | PRN

## 2023-06-12 MED ORDER — MONTELUKAST SODIUM 10 MG PO TABS: 10.0000 mg | ORAL_TABLET | Freq: Every day | ORAL | 0 refills | Status: DC

## 2023-06-12 MED ORDER — EMPAGLIFLOZIN 10 MG PO TABS: 10.0000 mg | ORAL_TABLET | Freq: Every day | ORAL | 0 refills | Status: DC

## 2023-06-12 MED ORDER — BUDESONIDE-FORMOTEROL FUMARATE 160-4.5 MCG/ACT IN AERO: 2.0000 | INHALATION_SPRAY | Freq: Two times a day (BID) | RESPIRATORY_TRACT | 1 refills | Status: DC | PRN

## 2023-06-15 ENCOUNTER — Other Ambulatory Visit: Payer: Self-pay | Admitting: Family Medicine

## 2023-07-08 ENCOUNTER — Other Ambulatory Visit: Payer: Self-pay | Admitting: Family Medicine

## 2023-07-12 ENCOUNTER — Other Ambulatory Visit: Payer: Self-pay | Admitting: Family Medicine

## 2023-07-17 ENCOUNTER — Encounter: Payer: Self-pay | Admitting: Physician Assistant

## 2023-07-17 ENCOUNTER — Ambulatory Visit (INDEPENDENT_AMBULATORY_CARE_PROVIDER_SITE_OTHER): Payer: Managed Care, Other (non HMO) | Admitting: Physician Assistant

## 2023-07-17 ENCOUNTER — Ambulatory Visit: Payer: Self-pay | Admitting: Family Medicine

## 2023-07-17 VITALS — BP 138/92 | HR 96 | Temp 98.0°F | Ht 65.0 in | Wt 195.1 lb

## 2023-07-17 DIAGNOSIS — E039 Hypothyroidism, unspecified: Secondary | ICD-10-CM

## 2023-07-17 DIAGNOSIS — R21 Rash and other nonspecific skin eruption: Secondary | ICD-10-CM

## 2023-07-17 DIAGNOSIS — E785 Hyperlipidemia, unspecified: Secondary | ICD-10-CM | POA: Diagnosis not present

## 2023-07-17 DIAGNOSIS — E119 Type 2 diabetes mellitus without complications: Secondary | ICD-10-CM | POA: Diagnosis not present

## 2023-07-17 DIAGNOSIS — G43009 Migraine without aura, not intractable, without status migrainosus: Secondary | ICD-10-CM | POA: Diagnosis not present

## 2023-07-17 MED ORDER — PREDNISONE 10 MG PO TABS: ORAL_TABLET | ORAL | 0 refills | Status: DC

## 2023-07-17 MED ORDER — PREDNISONE 10 MG PO TABS: ORAL_TABLET | ORAL | 0 refills | Status: AC

## 2023-07-17 NOTE — Telephone Encounter (Signed)
  Chief Complaint: rash Symptoms: itching, hives Frequency: 2 days Pertinent Negatives: Patient denies SOB, CP, Fever Disposition: [] ED /[] Urgent Care (no appt availability in office) / [x] Appointment(In office/virtual)/ []  [place] [facility]/ [] Home Care/ [] Refused Recommended Disposition /[] [place] [facility]/ []  Follow-up with PCP Additional Notes: Patient calls reporting widespread rash on hands, arms, legs, abdomen, neck x 2 days. Patient states that she has tried OTC remedies with no relief. Per protocol, patient to be evaluated within 24 hours. Scheduled with first available provider at patient request for today at 1600. Care advice reviewed, patient verbalized understanding. Alerting PCP for review.   Reason for Disposition  SEVERE itching (i.e., interferes with sleep, normal activities or school)  Answer Assessment - Initial Assessment Questions 1. APPEARANCE of RASH: "Describe the rash." (e.g., spots, blisters, raised areas, skin peeling, scaly)     Raised red, with white boarder on outside. Patches and individual areas as well. 2. SIZE: "How big are the spots?" (e.g., tip of pen, eraser, coin; inches, centimeters)     Quarter to half dollar size, some are pencil sized. 3. LOCATION: "Where is the rash located?"     Hands, arms are worst, a few on neck, face, and shoulders, abdomen, legs. 4. COLOR: "What color is the rash?" (Note: It is difficult to assess rash color in people with darker-colored skin. When this situation occurs, simply ask the caller to describe what they see.)     Red 5. ONSET: "When did the rash begin?"     Yesterday 6. FEVER: "Do you have a fever?" If Yes, ask: "What is your temperature, how was it measured, and when did it start?"     Denies 7. ITCHING: "Does the rash itch?" If Yes, ask: "How bad is the itch?" (Scale 1-10; or mild, moderate, severe)     7/10 8. CAUSE: "What do you think is causing the rash?"     States she used lotion on Saturday  night, which is the only thing that was different. 9. MEDICINE FACTORS: "Have you started any new medicines within the last 2 weeks?" (e.g., antibiotics)      Denies 10. OTHER SYMPTOMS: "Do you have any other symptoms?" (e.g., dizziness, headache, sore throat, joint pain)       Denies  Protocols used: Rash or Redness - [facility]

## 2023-07-17 NOTE — Progress Notes (Signed)
Established patient visit   Patient: Kristina Watts   DOB: 1970/11/30   53 y.o. Female  MRN: [id number] Visit Date: 07/17/2023  Today's healthcare provider: Alfredia Ferguson, PA-C   Chief Complaint  Patient presents with   Urticaria    Started yesterday- itching, hives are every where on body. OTC- a little bit of everything- nothing is helping  Only thing she can think of that caused it is a new pair of gloves that she applies her lotion with.    Subjective    Pt reports yesterday she used a new cotton glove at night ,to help protect against exzema. She used the same cream as usual. She did have an 'asthma attack' last night, resolved w/ inhaler use. She started feeling itching, and changed her bedding.  This morning, and today throughout the day, she began feeling more itchy spots across her arms, abdomen, head, neck  Medications: Outpatient Medications Prior to Visit  Medication Sig   acetaminophen (TYLENOL) 500 MG tablet Take 1,000 mg by mouth every 6 (six) hours as needed for moderate pain.   albuterol (VENTOLIN HFA) 108 (90 Base) MCG/ACT inhaler Inhale 2 puffs into the lungs every 6 (six) hours as needed for wheezing or shortness of breath.   amphetamine-dextroamphetamine (ADDERALL) 10 MG tablet Take 1 tablet (10 mg total) by mouth daily as needed (focus). September 2024   amphetamine-dextroamphetamine (ADDERALL) 10 MG tablet Take 1 tablet (10 mg total) by mouth daily as needed. December 2024   amphetamine-dextroamphetamine (ADDERALL) 10 MG tablet Take 1 tablet (10 mg total) by mouth daily as needed. January 2025   Blood Glucose Monitoring Suppl (ONETOUCH VERIO REFLECT) w/Device KIT USE TO CHECK SUGAR ONCE A DAY AND AS NEEDED. DX CODE: E11.9   budesonide-formoterol (SYMBICORT) 160-4.5 MCG/ACT inhaler Inhale 2 puffs into the lungs 2 (two) times daily as needed (shortness of breath or wheezing in the spring).   buPROPion (WELLBUTRIN XL) 150 MG 24 hr tablet TAKE 1 TABLET BY  MOUTH EVERY DAY   busPIRone (BUSPAR) 5 MG tablet Take 1 tablet (5 mg total) by mouth 2 (two) times daily as needed.   Butalbital-Acetaminophen (BUTALBITAL-APAP) 50-325 MG TABS Take 1 tablet by mouth 3 (three) times daily as needed.   empagliflozin (JARDIANCE) 10 MG TABS tablet Take 1 tablet (10 mg total) by mouth daily.   famotidine (PEPCID) 40 MG tablet Take 1 tablet (40 mg total) by mouth daily.   ibuprofen (ADVIL,MOTRIN) 200 MG tablet Take 400 mg by mouth every 6 (six) hours as needed for headache or moderate pain.   ketoconazole (NIZORAL) 2 % cream Apply 1 application topically daily. (Patient taking differently: Apply 1 application  topically daily as needed for irritation.)   Lancets (ONETOUCH DELICA PLUS LANCET33G) MISC Use to check sugar once a day and as needed.  Dx code: E11.9   levocetirizine (XYZAL) 5 MG tablet Take 5 mg by mouth every evening.   levothyroxine (SYNTHROID) 200 MCG tablet TAKE 1 TABLET BY MOUTH EVERY DAY   melatonin 3 MG TABS tablet Take 3 mg by mouth at bedtime.   metFORMIN (GLUCOPHAGE) 1000 MG tablet Take 1 tablet (1,000 mg total) by mouth 2 (two) times daily with a meal. LABS FOR FURTHER REFILLS   montelukast (SINGULAIR) 10 MG tablet Take 1 tablet (10 mg total) by mouth at bedtime.   Multiple Vitamin (MULTIVITAMIN) tablet Take 1 tablet by mouth daily.   omeprazole (PRILOSEC) 40 MG capsule TAKE 1 CAPSULE (40 MG TOTAL)  BY MOUTH DAILY.   ONETOUCH VERIO test strip USE TO CHECK SUGAR TWICE A DAY AND AS NEEDED. DX CODE: E11.65   promethazine (PHENERGAN) 25 MG tablet Take 1 tablet (25 mg total) by mouth every 8 (eight) hours as needed for nausea or vomiting.   tirzepatide (MOUNJARO) 5 MG/0.5ML Pen Inject 5 mg into the skin once a week.   triamcinolone ointment (KENALOG) 0.1 % Apply 1 Application topically at bedtime.   valACYclovir (VALTREX) 1000 MG tablet TAKE 1 TABLET BY MOUTH TWICE A DAY (Patient taking differently: as needed.)   venlafaxine XR (EFFEXOR-XR) 75 MG 24 hr  capsule TAKE 1 CAPSULE (75 MG TOTAL) BY MOUTH 2 (TWO) TIMES DAILY WITH BREAKFAST AND LUNCH.   No facility-administered medications prior to visit.    Review of Systems  Constitutional:  Negative for fatigue and fever.  Respiratory:  Negative for cough and shortness of breath.   Cardiovascular:  Negative for chest pain and leg swelling.  Gastrointestinal:  Negative for abdominal pain.  Skin:  Positive for rash.  Neurological:  Negative for dizziness and headaches.       Objective    BP (!) 138/92   Pulse 96   Temp 98 F (36.7 C) (Oral)   Ht 5\' 5"  (1.651 m)   Wt 195 lb 2 oz (88.5 kg)   SpO2 95%   BMI 32.47 kg/m    Physical Exam Vitals reviewed.  Constitutional:      Appearance: She is not ill-appearing.  HENT:     Head: Normocephalic.  Eyes:     Conjunctiva/sclera: Conjunctivae normal.  Cardiovascular:     Rate and Rhythm: Normal rate.  Pulmonary:     Effort: Pulmonary effort is normal. No respiratory distress.  Skin:    Comments: Many round erythematous slightly raised lesions across arms, forehead, neck, abdomen. Notable none on hands. Often in groupings of three  Neurological:     General: No focal deficit present.     Mental Status: She is alert and oriented to person, place, and time.  Psychiatric:        Mood and Affect: Mood normal.        Behavior: Behavior normal.      No results found for any visits on 07/17/23.  Assessment & Plan    Rash Recommending prednisone taper  Cont otc hydrocortisone, antihistamines Could be urticaria-- but advised appears similar to bed bug bites   -     predniSONE; Take 6 tablets (60 mg total) by mouth daily with breakfast for 1 day, THEN 5 tablets (50 mg total) daily with breakfast for 1 day, THEN 4 tablets (40 mg total) daily with breakfast for 1 day, THEN 3 tablets (30 mg total) daily with breakfast for 1 day, THEN 2 tablets (20 mg total) daily with breakfast for 1 day, THEN 1 tablet (10 mg total) daily with breakfast  for 1 day, THEN 0.5 tablets (5 mg total) daily with breakfast for 1 day.  Dispense: 22 tablet; Refill: 0  Hyperlipidemia, unspecified hyperlipidemia type -     Lipid panel -     Comprehensive metabolic panel -     CBC with Differential/Platelet  Migraine without aura and without status migrainosus, not intractable -     CBC with Differential/Platelet  Diabetes mellitus without complication (HCC) -     Hemoglobin A1c -     Microalbumin / creatinine urine ratio  Hypothyroidism, unspecified type -     TSH   Pt requesting to  complete labs that were ordered last visit.   Return if symptoms worsen or fail to improve.       Alfredia Ferguson, PA-C  [facility] at [facility] [phone] (phone) [phone] (fax)  [facility]

## 2023-07-17 NOTE — Telephone Encounter (Signed)
Pt has scheduled OV 07/17/2023.

## 2023-07-18 LAB — CBC WITH DIFFERENTIAL/PLATELET
Basophils Absolute: 0.1 10*3/uL (ref 0.0–0.1)
Basophils Relative: 0.6 % (ref 0.0–3.0)
Eosinophils Absolute: 0.2 10*3/uL (ref 0.0–0.7)
Eosinophils Relative: 2.7 % (ref 0.0–5.0)
HCT: 34.5 % — ABNORMAL LOW (ref 36.0–46.0)
Hemoglobin: 11 g/dL — ABNORMAL LOW (ref 12.0–15.0)
Lymphocytes Relative: 21.6 % (ref 12.0–46.0)
Lymphs Abs: 1.9 10*3/uL (ref 0.7–4.0)
MCHC: 32 g/dL (ref 30.0–36.0)
MCV: 76.9 fL — ABNORMAL LOW (ref 78.0–100.0)
Monocytes Absolute: 0.6 10*3/uL (ref 0.1–1.0)
Monocytes Relative: 7.3 % (ref 3.0–12.0)
Neutro Abs: 6 10*3/uL (ref 1.4–7.7)
Neutrophils Relative %: 67.8 % (ref 43.0–77.0)
Platelets: 406 10*3/uL — ABNORMAL HIGH (ref 150.0–400.0)
RBC: 4.49 Mil/uL (ref 3.87–5.11)
RDW: 16.1 % — ABNORMAL HIGH (ref 11.5–15.5)
WBC: 8.8 10*3/uL (ref 4.0–10.5)

## 2023-07-18 LAB — LIPID PANEL
Cholesterol: 163 mg/dL (ref 0–200)
HDL: 47.5 mg/dL (ref >39.00)
LDL Cholesterol: 96 mg/dL (ref 0–99)
NonHDL: 115.66
Total CHOL/HDL Ratio: 3
Triglycerides: 99 mg/dL (ref 0.0–149.0)
VLDL: 19.8 mg/dL (ref 0.0–40.0)

## 2023-07-18 LAB — COMPREHENSIVE METABOLIC PANEL
ALT: 18 U/L (ref 0–35)
AST: 13 U/L (ref 0–37)
Albumin: 4.8 g/dL (ref 3.5–5.2)
Alkaline Phosphatase: 65 U/L (ref 39–117)
BUN: 13 mg/dL (ref 6–23)
CO2: 26 meq/L (ref 19–32)
Calcium: 9.1 mg/dL (ref 8.4–10.5)
Chloride: 102 meq/L (ref 96–112)
Creatinine, Ser: 0.79 mg/dL (ref 0.40–1.20)
GFR: 87.28 mL/min (ref >60.00)
Glucose, Bld: 67 mg/dL — ABNORMAL LOW (ref 70–99)
Potassium: 4.3 meq/L (ref 3.5–5.1)
Sodium: 137 meq/L (ref 135–145)
Total Bilirubin: 0.3 mg/dL (ref 0.2–1.2)
Total Protein: 7.3 g/dL (ref 6.0–8.3)

## 2023-07-18 LAB — TSH: TSH: 0.21 u[IU]/mL — ABNORMAL LOW (ref 0.35–5.50)

## 2023-07-18 LAB — MICROALBUMIN / CREATININE URINE RATIO
Creatinine,U: 62.3 mg/dL
Microalb Creat Ratio: 1.1 mg/g (ref 0.0–30.0)
Microalb, Ur: <0.7 mg/dL (ref 0.0–1.9)

## 2023-07-18 LAB — HEMOGLOBIN A1C: Hgb A1c MFr Bld: 6 % (ref 4.6–6.5)

## 2023-07-19 ENCOUNTER — Other Ambulatory Visit: Payer: Self-pay | Admitting: Emergency Medicine

## 2023-07-19 DIAGNOSIS — E039 Hypothyroidism, unspecified: Secondary | ICD-10-CM

## 2023-07-19 DIAGNOSIS — R790 Abnormal level of blood mineral: Secondary | ICD-10-CM

## 2023-07-19 DIAGNOSIS — E785 Hyperlipidemia, unspecified: Secondary | ICD-10-CM

## 2023-07-19 DIAGNOSIS — G43009 Migraine without aura, not intractable, without status migrainosus: Secondary | ICD-10-CM

## 2023-07-19 MED ORDER — LEVOTHYROXINE SODIUM 175 MCG PO TABS: 175.0000 ug | ORAL_TABLET | Freq: Every day | ORAL | 5 refills | Status: DC

## 2023-07-25 ENCOUNTER — Other Ambulatory Visit: Payer: Self-pay | Admitting: Family Medicine

## 2023-07-27 ENCOUNTER — Other Ambulatory Visit: Payer: Self-pay | Admitting: Gastroenterology

## 2023-07-27 NOTE — Telephone Encounter (Signed)
Needs office appointment.

## 2023-08-18 ENCOUNTER — Other Ambulatory Visit (INDEPENDENT_AMBULATORY_CARE_PROVIDER_SITE_OTHER): Payer: Managed Care, Other (non HMO)

## 2023-08-18 DIAGNOSIS — R790 Abnormal level of blood mineral: Secondary | ICD-10-CM | POA: Diagnosis not present

## 2023-08-18 DIAGNOSIS — E785 Hyperlipidemia, unspecified: Secondary | ICD-10-CM

## 2023-08-18 DIAGNOSIS — G43009 Migraine without aura, not intractable, without status migrainosus: Secondary | ICD-10-CM

## 2023-08-18 DIAGNOSIS — E039 Hypothyroidism, unspecified: Secondary | ICD-10-CM | POA: Diagnosis not present

## 2023-08-18 LAB — COMPREHENSIVE METABOLIC PANEL
ALT: 14 U/L (ref 0–35)
AST: 11 U/L (ref 0–37)
Albumin: 4.2 g/dL (ref 3.5–5.2)
Alkaline Phosphatase: 56 U/L (ref 39–117)
BUN: 15 mg/dL (ref 6–23)
CO2: 27 meq/L (ref 19–32)
Calcium: 8.8 mg/dL (ref 8.4–10.5)
Chloride: 103 meq/L (ref 96–112)
Creatinine, Ser: 0.74 mg/dL (ref 0.40–1.20)
GFR: 94.34 mL/min (ref >60.00)
Glucose, Bld: 121 mg/dL — ABNORMAL HIGH (ref 70–99)
Potassium: 4.7 meq/L (ref 3.5–5.1)
Sodium: 138 meq/L (ref 135–145)
Total Bilirubin: 0.3 mg/dL (ref 0.2–1.2)
Total Protein: 6.7 g/dL (ref 6.0–8.3)

## 2023-08-18 LAB — CBC WITH DIFFERENTIAL/PLATELET
Basophils Absolute: 0.1 10*3/uL (ref 0.0–0.1)
Basophils Relative: 1.5 % (ref 0.0–3.0)
Eosinophils Absolute: 0.2 10*3/uL (ref 0.0–0.7)
Eosinophils Relative: 2.4 % (ref 0.0–5.0)
HCT: 34.6 % — ABNORMAL LOW (ref 36.0–46.0)
Hemoglobin: 10.8 g/dL — ABNORMAL LOW (ref 12.0–15.0)
Lymphocytes Relative: 21 % (ref 12.0–46.0)
Lymphs Abs: 1.4 10*3/uL (ref 0.7–4.0)
MCHC: 31.2 g/dL (ref 30.0–36.0)
MCV: 77.9 fl — ABNORMAL LOW (ref 78.0–100.0)
Monocytes Absolute: 0.5 10*3/uL (ref 0.1–1.0)
Monocytes Relative: 6.9 % (ref 3.0–12.0)
Neutro Abs: 4.4 10*3/uL (ref 1.4–7.7)
Neutrophils Relative %: 68.2 % (ref 43.0–77.0)
Platelets: 387 10*3/uL (ref 150.0–400.0)
RBC: 4.44 Mil/uL (ref 3.87–5.11)
RDW: 17 % — ABNORMAL HIGH (ref 11.5–15.5)
WBC: 6.5 10*3/uL (ref 4.0–10.5)

## 2023-08-18 LAB — TSH: TSH: 0.5 u[IU]/mL (ref 0.35–5.50)

## 2023-08-18 LAB — IRON: Iron: 31 ug/dL — ABNORMAL LOW (ref 42–145)

## 2023-08-20 ENCOUNTER — Encounter: Payer: Self-pay | Admitting: Family Medicine

## 2023-08-20 DIAGNOSIS — D509 Iron deficiency anemia, unspecified: Secondary | ICD-10-CM | POA: Insufficient documentation

## 2023-08-20 NOTE — Assessment & Plan Note (Signed)
 hgba1c acceptable, minimize simple carbs. Increase exercise as tolerated. Continue current meds

## 2023-08-20 NOTE — Assessment & Plan Note (Signed)
 Encourage heart healthy diet such as MIND or DASH diet, increase exercise, avoid trans fats, simple carbohydrates and processed foods, consider a krill or fish or flaxseed oil cap daily.

## 2023-08-20 NOTE — Assessment & Plan Note (Signed)
 Stable on current meds most days

## 2023-08-20 NOTE — Assessment & Plan Note (Signed)
 Tolerating Mounjaro with good response

## 2023-08-20 NOTE — Assessment & Plan Note (Signed)
 On Levothyroxine, continue to monitor

## 2023-08-20 NOTE — Assessment & Plan Note (Signed)
 Stable on current meds

## 2023-08-20 NOTE — Assessment & Plan Note (Signed)
 Increase leafy greens, consider increased lean red meat and using cast iron cookware. Continue to monitor, report any concerns

## 2023-08-21 ENCOUNTER — Ambulatory Visit (INDEPENDENT_AMBULATORY_CARE_PROVIDER_SITE_OTHER): Payer: Managed Care, Other (non HMO) | Admitting: Family Medicine

## 2023-08-21 VITALS — BP 112/77 | HR 83 | Temp 98.3°F | Resp 18 | Ht 65.0 in | Wt 191.0 lb

## 2023-08-21 DIAGNOSIS — Z23 Encounter for immunization: Secondary | ICD-10-CM | POA: Diagnosis not present

## 2023-08-21 DIAGNOSIS — Z7985 Long-term (current) use of injectable non-insulin antidiabetic drugs: Secondary | ICD-10-CM

## 2023-08-21 DIAGNOSIS — E119 Type 2 diabetes mellitus without complications: Secondary | ICD-10-CM | POA: Diagnosis not present

## 2023-08-21 DIAGNOSIS — F418 Other specified anxiety disorders: Secondary | ICD-10-CM

## 2023-08-21 DIAGNOSIS — F909 Attention-deficit hyperactivity disorder, unspecified type: Secondary | ICD-10-CM

## 2023-08-21 DIAGNOSIS — D509 Iron deficiency anemia, unspecified: Secondary | ICD-10-CM

## 2023-08-21 DIAGNOSIS — E039 Hypothyroidism, unspecified: Secondary | ICD-10-CM

## 2023-08-21 DIAGNOSIS — E785 Hyperlipidemia, unspecified: Secondary | ICD-10-CM

## 2023-08-21 DIAGNOSIS — E669 Obesity, unspecified: Secondary | ICD-10-CM

## 2023-08-21 MED ORDER — LEVOTHYROXINE SODIUM 175 MCG PO TABS: 175.0000 ug | ORAL_TABLET | Freq: Every day | ORAL | 3 refills | Status: DC

## 2023-08-21 MED ORDER — AMPHETAMINE-DEXTROAMPHETAMINE 10 MG PO TABS: 10.0000 mg | ORAL_TABLET | Freq: Every day | ORAL | 0 refills | Status: DC | PRN

## 2023-08-21 MED ORDER — HEMOCYTE-F 324-1 MG PO TABS: 1.0000 | ORAL_TABLET | Freq: Every day | ORAL | 3 refills | Status: DC

## 2023-08-21 MED ORDER — OMEPRAZOLE 40 MG PO CPDR: 40.0000 mg | DELAYED_RELEASE_CAPSULE | Freq: Every day | ORAL | 3 refills | Status: AC

## 2023-08-21 MED ORDER — MONTELUKAST SODIUM 10 MG PO TABS: 10.0000 mg | ORAL_TABLET | Freq: Every day | ORAL | 3 refills | Status: DC

## 2023-08-21 MED ORDER — FAMOTIDINE 40 MG PO TABS: 40.0000 mg | ORAL_TABLET | Freq: Every day | ORAL | 3 refills | Status: AC

## 2023-08-21 MED ORDER — EMPAGLIFLOZIN 10 MG PO TABS
10.0000 mg | ORAL_TABLET | Freq: Every day | ORAL | 3 refills | Status: AC
Start: 2023-08-21 — End: ?

## 2023-08-21 MED ORDER — VENLAFAXINE HCL ER 75 MG PO CP24: 75.0000 mg | ORAL_CAPSULE | Freq: Two times a day (BID) | ORAL | 3 refills | Status: DC

## 2023-08-21 MED ORDER — MOUNJARO 5 MG/0.5ML ~~LOC~~ SOAJ: 5.0000 mg | SUBCUTANEOUS | 1 refills | Status: DC

## 2023-08-21 MED ORDER — BUPROPION HCL ER (XL) 150 MG PO TB24: 150.0000 mg | ORAL_TABLET | Freq: Every day | ORAL | 3 refills | Status: DC

## 2023-08-21 MED ORDER — METFORMIN HCL 1000 MG PO TABS: 1000.0000 mg | ORAL_TABLET | Freq: Two times a day (BID) | ORAL | 3 refills | Status: DC

## 2023-08-21 NOTE — Progress Notes (Signed)
 Subjective:    Patient ID: Kristina Watts, female    DOB: [birth year], 53 y.o.   MRN: [id number]  Chief Complaint  Patient presents with  . Follow-up    HPI Discussed the use of AI scribe software for clinical note transcription with the patient, who gave verbal consent to proceed.  History of Present Illness Kristina Watts "Kristina Watts" is a 53 year old female who presents for medication management and follow-up on previous medical issues. She is accompanied by her son, Kristina Watts.  She underwent a colonoscopy in February 2023 at [facility], during which a couple of polyps were removed and found to be benign. There is no family history of colon cancer.  She is currently on Mounjaro for weight management and notes a gradual decrease in weight. No gastrointestinal upset, possibly due to her diet including [place] yogurt ice cream and probiotics.  She experiences fatigue, which she attributes to anemia. She has started taking a multivitamin with iron but acknowledges it may not be sufficient.  She experiences back pain, which she attributes to menstrual cramps as she is just finishing her period. Stress also contributes to her back pain.  She manages her medications, including Adderall, Wellbutrin, Symbicort, Pepcid, levothyroxine, Singulair, Prilosec, and venlafaxine. She has not been using buspirone recently. She requires refills for Jardiance, Glucophage, and other medications.  Her husband, Kristina Watts, has been experiencing weakness due to changes in his pain medication, making transfers more difficult. She has been relying on her son, Kristina Watts, for assistance.    Past Medical History:  Diagnosis Date  . Acute bronchitis 11/19/2012  . ADD (attention deficit disorder) 04/27/2012  . Allergy    seasonal  . Asthma   . Back pain 11/07/2013  . Cancer [facility])    thyroid- March 2023  . Cervical cancer screening 12/23/2014  . Chicken pox as a child  . Depression with anxiety 04/18/2011  .  Dermatitis 05/18/2011  . Diabetes mellitus without complication (HCC)   . GERD (gastroesophageal reflux disease)   . History of gestational diabetes mellitus (GDM) 04/19/2011  . History of pancreatitis 04/19/2011  . Hyperlipidemia 04/27/2012  . LUQ pain 12/12/2011  . Migraine 08/23/2011  . Otitis media of right ear 05/30/2011  . Overweight(278.02) 04/19/2011  . Pancreatitis, recurrent 12/12/2011  . Pharyngitis 08/23/2011  . Pneumonia 1998  . Preventative health care 04/19/2011  . 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 Reflux 12/12/2011  . Tinea corporis 05/18/2011    Past Surgical History:  Procedure Laterality Date  . APPENDECTOMY  06/20/2005  . CESAREAN SECTION  1-02 and 4-04  . CHOLECYSTECTOMY  06/20/1998  . ESOPHAGOGASTRODUODENOSCOPY     2001 thicken gallbladder was found. Was done due to pancreatitis  . THYROIDECTOMY N/A 08/27/2021   Procedure: TOTAL THYROIDECTOMY;  Surgeon: Kinsinger, De Blanch, MD;  Location: [facility];  Service: General;  Laterality: N/A;  . UPPER GASTROINTESTINAL ENDOSCOPY      Family History  Problem Relation Age of Onset  . Thyroid disease Mother   . Colon polyps Mother   . Hypertension Father   . Hyperlipidemia Father   . Heart disease Father   . Diabetes Father   . COPD Father        previous smoker  . Stroke Father   . Depression Father   . Restless legs syndrome Father   . Colon polyps Father   . Diverticulosis Father   . Diabetes Brother   . Cancer Maternal Grandmother  Head and Neck  . Heart disease Paternal Grandmother   . Hyperlipidemia Paternal Grandmother   . Hypertension Paternal Grandmother   . Diabetes Paternal Grandmother   . Depression Paternal Grandmother   . Stroke Paternal Grandmother   . Restless legs syndrome Paternal Grandmother   . Heart disease Paternal Grandfather   . Hyperlipidemia Paternal Grandfather   . Hypertension Paternal Grandfather   . Stroke Paternal Grandfather   . Diabetes  Paternal Grandfather   . Colon cancer Neg Hx   . Esophageal cancer Neg Hx   . Rectal cancer Neg Hx   . Stomach cancer Neg Hx     Social History   Socioeconomic History  . Marital status: Married    Spouse name: Not on file  . Number of children: Not on file  . Years of education: Not on file  . Highest education level: Not on file  Occupational History  . Not on file  Tobacco Use  . Smoking status: Never  . Smokeless tobacco: Never  Vaping Use  . Vaping status: Never Used  Substance and Sexual Activity  . Alcohol use: Yes    Comment: 1/week  . Drug use: No  . Sexual activity: Not Currently    Partners: Male    Comment: abstinence  Other Topics Concern  . Not on file  Social History Narrative  . Not on file   Social Drivers of Health   Financial Resource Strain: Not on file  Food Insecurity: Not on file  Transportation Needs: Not on file  Physical Activity: Not on file  Stress: Not on file  Social Connections: Not on file  Intimate Partner Violence: Not on file    Outpatient Medications Prior to Visit  Medication Sig Dispense Refill  . acetaminophen (TYLENOL) 500 MG tablet Take 1,000 mg by mouth every 6 (six) hours as needed for moderate pain.    Marland Kitchen albuterol (VENTOLIN HFA) 108 (90 Base) MCG/ACT inhaler Inhale 2 puffs into the lungs every 6 (six) hours as needed for wheezing or shortness of breath. 54 g 1  . Blood Glucose Monitoring Suppl (ONETOUCH VERIO REFLECT) w/Device KIT USE TO CHECK SUGAR ONCE A DAY AND AS NEEDED. DX CODE: E11.9 1 kit 0  . budesonide-formoterol (SYMBICORT) 160-4.5 MCG/ACT inhaler Inhale 2 puffs into the lungs 2 (two) times daily as needed (shortness of breath or wheezing in the spring). 30.6 g 1  . Butalbital-Acetaminophen (BUTALBITAL-APAP) 50-325 MG TABS Take 1 tablet by mouth 3 (three) times daily as needed. 60 each 0  . ibuprofen (ADVIL,MOTRIN) 200 MG tablet Take 400 mg by mouth every 6 (six) hours as needed for headache or moderate pain.     Marland Kitchen ketoconazole (NIZORAL) 2 % cream Apply 1 application topically daily. (Patient taking differently: Apply 1 application  topically daily as needed for irritation.) 30 g 1  . Lancets (ONETOUCH DELICA PLUS LANCET33G) MISC Use to check sugar once a day and as needed.  Dx code: E11.9 100 each 1  . levocetirizine (XYZAL) 5 MG tablet Take 5 mg by mouth every evening.    . melatonin 3 MG TABS tablet Take 3 mg by mouth at bedtime.    . Multiple Vitamin (MULTIVITAMIN) tablet Take 1 tablet by mouth daily.    Letta Pate VERIO test strip USE TO CHECK SUGAR TWICE A DAY AND AS NEEDED. DX CODE: E11.65 100 strip 12  . promethazine (PHENERGAN) 25 MG tablet Take 1 tablet (25 mg total) by mouth every 8 (eight) hours as  needed for nausea or vomiting. 30 tablet 1  . triamcinolone ointment (KENALOG) 0.1 % Apply 1 Application topically at bedtime. 80 g 1  . valACYclovir (VALTREX) 1000 MG tablet TAKE 1 TABLET BY MOUTH TWICE A DAY (Patient taking differently: as needed.) 180 tablet 1  . amphetamine-dextroamphetamine (ADDERALL) 10 MG tablet Take 1 tablet (10 mg total) by mouth daily as needed (focus). September 2024 30 tablet 0  . amphetamine-dextroamphetamine (ADDERALL) 10 MG tablet Take 1 tablet (10 mg total) by mouth daily as needed. December 2024 30 tablet 0  . amphetamine-dextroamphetamine (ADDERALL) 10 MG tablet Take 1 tablet (10 mg total) by mouth daily as needed. January 2025 30 tablet 0  . buPROPion (WELLBUTRIN XL) 150 MG 24 hr tablet TAKE 1 TABLET BY MOUTH EVERY DAY 90 tablet 1  . busPIRone (BUSPAR) 5 MG tablet Take 1 tablet (5 mg total) by mouth 2 (two) times daily as needed. 40 tablet 1  . empagliflozin (JARDIANCE) 10 MG TABS tablet Take 1 tablet (10 mg total) by mouth daily. 90 tablet 0  . famotidine (PEPCID) 40 MG tablet TAKE 1 TABLET BY MOUTH EVERY DAY 90 tablet 1  . levothyroxine (SYNTHROID) 175 MCG tablet Take 1 tablet (175 mcg total) by mouth daily. 30 tablet 5  . metFORMIN (GLUCOPHAGE) 1000 MG tablet  Take 1 tablet (1,000 mg total) by mouth 2 (two) times daily with a meal. LABS FOR FURTHER REFILLS 60 tablet 0  . montelukast (SINGULAIR) 10 MG tablet Take 1 tablet (10 mg total) by mouth at bedtime. 90 tablet 0  . omeprazole (PRILOSEC) 40 MG capsule TAKE 1 CAPSULE (40 MG TOTAL) BY MOUTH DAILY. 90 capsule 1  . tirzepatide (MOUNJARO) 5 MG/0.5ML Pen Inject 5 mg into the skin once a week. 2 mL 1  . venlafaxine XR (EFFEXOR-XR) 75 MG 24 hr capsule TAKE 1 CAPSULE (75 MG TOTAL) BY MOUTH 2 (TWO) TIMES DAILY WITH BREAKFAST AND LUNCH. 180 capsule 2   No facility-administered medications prior to visit.    Allergies  Allergen Reactions  . Actifed [Triprolidine-Pse] Hives    Review of Systems  Constitutional:  Negative for fever and malaise/fatigue.  HENT:  Negative for congestion.   Eyes:  Negative for blurred vision.  Respiratory:  Negative for shortness of breath.   Cardiovascular:  Negative for chest pain, palpitations and leg swelling.  Gastrointestinal:  Negative for abdominal pain, blood in stool and nausea.  Genitourinary:  Negative for dysuria and frequency.  Musculoskeletal:  Negative for falls.  Skin:  Negative for rash.  Neurological:  Negative for dizziness, loss of consciousness and headaches.  Endo/Heme/Allergies:  Negative for environmental allergies.  Psychiatric/Behavioral:  Negative for depression. The patient is not nervous/anxious.       Objective:    Physical Exam Constitutional:      General: She is not in acute distress.    Appearance: Normal appearance. She is well-developed. She is not toxic-appearing.  HENT:     Head: Normocephalic and atraumatic.     Right Ear: External ear normal.     Left Ear: External ear normal.     Nose: Nose normal.  Eyes:     General:        Right eye: No discharge.        Left eye: No discharge.     Conjunctiva/sclera: Conjunctivae normal.  Neck:     Thyroid: No thyromegaly.  Cardiovascular:     Rate and Rhythm: Normal rate and  regular rhythm.  Heart sounds: Normal heart sounds. No murmur heard. Pulmonary:     Effort: Pulmonary effort is normal. No respiratory distress.     Breath sounds: Normal breath sounds.  Abdominal:     General: Bowel sounds are normal.     Palpations: Abdomen is soft.     Tenderness: There is no abdominal tenderness. There is no guarding.  Musculoskeletal:        General: Normal range of motion.     Cervical back: Neck supple.  Lymphadenopathy:     Cervical: No cervical adenopathy.  Skin:    General: Skin is warm and dry.  Neurological:     Mental Status: She is alert and oriented to person, place, and time.  Psychiatric:        Mood and Affect: Mood normal.        Behavior: Behavior normal.        Thought Content: Thought content normal.        Judgment: Judgment normal.   BP 112/77 (BP Location: Left Arm, Patient Position: Sitting, Cuff Size: Normal)   Pulse 83   Temp 98.3 F (36.8 C) (Oral)   Resp 18   Ht 5\' 5"  (1.651 m)   Wt 191 lb (86.6 kg)   SpO2 97%   BMI 31.78 kg/m  Wt Readings from Last 3 Encounters:  08/21/23 191 lb (86.6 kg)  07/17/23 195 lb 2 oz (88.5 kg)  02/16/23 204 lb (92.5 kg)    Diabetic Foot Exam - Simple   No data filed    Lab Results  Component Value Date   WBC 6.5 08/18/2023   HGB 10.8 (L) 08/18/2023   HCT 34.6 (L) 08/18/2023   PLT 387.0 08/18/2023   GLUCOSE 121 (H) 08/18/2023   CHOL 163 07/17/2023   TRIG 99.0 07/17/2023   HDL 47.50 07/17/2023   LDLDIRECT 104.0 04/25/2022   LDLCALC 96 07/17/2023   ALT 14 08/18/2023   AST 11 08/18/2023   NA 138 08/18/2023   K 4.7 08/18/2023   CL 103 08/18/2023   CREATININE 0.74 08/18/2023   BUN 15 08/18/2023   CO2 27 08/18/2023   TSH 0.50 08/18/2023   HGBA1C 6.0 07/17/2023   MICROALBUR <0.7 07/17/2023    Lab Results  Component Value Date   TSH 0.50 08/18/2023   Lab Results  Component Value Date   WBC 6.5 08/18/2023   HGB 10.8 (L) 08/18/2023   HCT 34.6 (L) 08/18/2023   MCV 77.9 (L)  08/18/2023   PLT 387.0 08/18/2023   Lab Results  Component Value Date   NA 138 08/18/2023   K 4.7 08/18/2023   CO2 27 08/18/2023   GLUCOSE 121 (H) 08/18/2023   BUN 15 08/18/2023   CREATININE 0.74 08/18/2023   BILITOT 0.3 08/18/2023   ALKPHOS 56 08/18/2023   AST 11 08/18/2023   ALT 14 08/18/2023   PROT 6.7 08/18/2023   ALBUMIN 4.2 08/18/2023   CALCIUM 8.8 08/18/2023   ANIONGAP 8 08/28/2021   GFR 94.34 08/18/2023   Lab Results  Component Value Date   CHOL 163 07/17/2023   Lab Results  Component Value Date   HDL 47.50 07/17/2023   Lab Results  Component Value Date   LDLCALC 96 07/17/2023   Lab Results  Component Value Date   TRIG 99.0 07/17/2023   Lab Results  Component Value Date   CHOLHDL 3 07/17/2023   Lab Results  Component Value Date   HGBA1C 6.0 07/17/2023       Assessment &  Plan:  Diabetes mellitus without complication (HCC) Assessment & Plan: hgba1c acceptable, minimize simple carbs. Increase exercise as tolerated. Continue current meds   Orders: -     Comprehensive metabolic panel; Future -     Hemoglobin A1c; Future -     Microalbumin / creatinine urine ratio; Future -     Comprehensive metabolic panel; Future -     Hemoglobin A1c; Future  Attention deficit hyperactivity disorder (ADHD), unspecified ADHD type Assessment & Plan: Stable on current meds  Orders: -     Amphetamine-Dextroamphetamine; Take 1 tablet (10 mg total) by mouth daily as needed (focus). March 2025  Dispense: 30 tablet; Refill: 0 -     Amphetamine-Dextroamphetamine; Take 1 tablet (10 mg total) by mouth daily as needed. April 2025  Dispense: 30 tablet; Refill: 0 -     Amphetamine-Dextroamphetamine; Take 1 tablet (10 mg total) by mouth daily as needed. May 2025  Dispense: 30 tablet; Refill: 0  Depression with anxiety Assessment & Plan: Stable on current meds most days   Hyperlipidemia, unspecified hyperlipidemia type Assessment & Plan: Encourage heart healthy diet such  as MIND or DASH diet, increase exercise, avoid trans fats, simple carbohydrates and processed foods, consider a krill or fish or flaxseed oil cap daily.    Orders: -     Lipid panel; Future -     Lipid panel; Future  Hypothyroidism, unspecified type Assessment & Plan: On Levothyroxine, continue to monitor   Orders: -     TSH; Future -     TSH; Future  Obesity (BMI 30-39.9) Assessment & Plan: Tolerating Mounjaro with good response   Iron deficiency anemia, unspecified iron deficiency anemia type Assessment & Plan: Increase leafy greens, consider increased lean red meat and using cast iron cookware. Continue to monitor, report any concerns   Orders: -     CBC with Differential/Platelet; Future -     CBC with Differential/Platelet; Future -     Iron, TIBC and Ferritin Panel; Future -     Iron, TIBC and Ferritin Panel; Future  Need for influenza vaccination -     Flu vaccine trivalent PF, 6mos and older(Flulaval,Afluria,Fluarix,Fluzone)  Need for shingles vaccine -     Varicella-zoster vaccine IM  Other orders -     Omeprazole; Take 1 capsule (40 mg total) by mouth daily.  Dispense: 90 capsule; Refill: 3 -     Venlafaxine HCl ER; Take 1 capsule (75 mg total) by mouth 2 (two) times daily with breakfast and lunch.  Dispense: 180 capsule; Refill: 3 -     buPROPion HCl ER (XL); Take 1 tablet (150 mg total) by mouth daily.  Dispense: 90 tablet; Refill: 3 -     Mounjaro; Inject 5 mg into the skin once a week.  Dispense: 6 mL; Refill: 1 -     Montelukast Sodium; Take 1 tablet (10 mg total) by mouth at bedtime.  Dispense: 90 tablet; Refill: 3 -     Empagliflozin; Take 1 tablet (10 mg total) by mouth daily.  Dispense: 90 tablet; Refill: 3 -     metFORMIN HCl; Take 1 tablet (1,000 mg total) by mouth 2 (two) times daily with a meal. LABS FOR FURTHER REFILLS  Dispense: 180 tablet; Refill: 3 -     Levothyroxine Sodium; Take 1 tablet (175 mcg total) by mouth daily.  Dispense: 90 tablet;  Refill: 3 -     Famotidine; Take 1 tablet (40 mg total) by mouth daily.  Dispense: 90 tablet;  Refill: 3 -     Hemocyte-F; Take 1 tablet by mouth daily.  Dispense: 30 tablet; Refill: 3    Assessment and Plan Assessment & Plan Weight management Weight decreased from 195 lbs to 191 lbs on Mounjaro. No significant gastrointestinal side effects. Insurance approved medication after initial denial. Appetite suppression aids in maintaining quality of life. - Continue Mounjaro at 5 mg. - Monitor weight; consider increasing to 7.5 mg if weight loss plateaus. - Ensure insurance coverage is maintained.  Anemia Fatigue possibly related to anemia. Multivitamin with iron insufficient. Hemocyte recommended for better absorption and less constipation risk. - Prescribe Hemocyte. - Monitor iron levels in three months.  Colonoscopy follow-up Colonoscopy showed benign polyps. No family history of colon cancer. Regular screenings emphasized. - Ensure colonoscopy records are documented. - Schedule next colonoscopy in 10 years unless symptoms or family history changes.  Shingles prevention She has not received the shingles vaccine. Increased dementia risk with shingles; decreased risk with vaccination. Insurance coverage uncertain. - Recommend shingles vaccination. Discuss insurance coverage options.  Medication management Requires refills for multiple medications. Discussed 90-day supply for simplification. Adderall sent to local pharmacy due to regulations. - Send 90-day prescriptions for Jardiance, Glucophage, Singulair, Prilosec, and others. - Send Adderall and iron prescriptions to local pharmacy.  Follow-up Plan includes monitoring blood work and medication management. Blood work scheduled in three months, follow-up in six months. - Schedule blood work in three months. - Schedule follow-up visit in six months with lab appointment prior.     Danise Edge, MD

## 2023-09-08 ENCOUNTER — Other Ambulatory Visit: Payer: Self-pay | Admitting: Family Medicine

## 2023-09-08 ENCOUNTER — Encounter: Payer: Self-pay | Admitting: *Deleted

## 2023-10-24 ENCOUNTER — Other Ambulatory Visit: Payer: Self-pay | Admitting: Family Medicine

## 2023-11-21 ENCOUNTER — Other Ambulatory Visit (INDEPENDENT_AMBULATORY_CARE_PROVIDER_SITE_OTHER)

## 2023-11-21 DIAGNOSIS — E119 Type 2 diabetes mellitus without complications: Secondary | ICD-10-CM

## 2023-11-21 DIAGNOSIS — E785 Hyperlipidemia, unspecified: Secondary | ICD-10-CM | POA: Diagnosis not present

## 2023-11-21 DIAGNOSIS — E039 Hypothyroidism, unspecified: Secondary | ICD-10-CM | POA: Diagnosis not present

## 2023-11-21 DIAGNOSIS — D509 Iron deficiency anemia, unspecified: Secondary | ICD-10-CM

## 2023-11-22 ENCOUNTER — Ambulatory Visit: Payer: Self-pay | Admitting: Family Medicine

## 2023-11-22 LAB — MICROALBUMIN / CREATININE URINE RATIO
Creatinine,U: 148.7 mg/dL
Microalb Creat Ratio: 12.2 mg/g (ref 0.0–30.0)
Microalb, Ur: 1.8 mg/dL (ref 0.0–1.9)

## 2023-11-22 LAB — COMPREHENSIVE METABOLIC PANEL WITH GFR
ALT: 16 U/L (ref 0–35)
AST: 18 U/L (ref 0–37)
Albumin: 4.7 g/dL (ref 3.5–5.2)
Alkaline Phosphatase: 58 U/L (ref 39–117)
BUN: 14 mg/dL (ref 6–23)
CO2: 17 meq/L — ABNORMAL LOW (ref 19–32)
Calcium: 9.7 mg/dL (ref 8.4–10.5)
Chloride: 105 meq/L (ref 96–112)
Creatinine, Ser: 0.84 mg/dL (ref 0.40–1.20)
GFR: 80.88 mL/min (ref >60.00)
Glucose, Bld: 86 mg/dL (ref 70–99)
Potassium: 4.7 meq/L (ref 3.5–5.1)
Sodium: 137 meq/L (ref 135–145)
Total Bilirubin: 0.4 mg/dL (ref 0.2–1.2)
Total Protein: 7.2 g/dL (ref 6.0–8.3)

## 2023-11-22 LAB — CBC WITH DIFFERENTIAL/PLATELET
Basophils Absolute: 0.1 10*3/uL (ref 0.0–0.1)
Basophils Relative: 0.8 % (ref 0.0–3.0)
Eosinophils Absolute: 0.2 10*3/uL (ref 0.0–0.7)
Eosinophils Relative: 2.6 % (ref 0.0–5.0)
HCT: 38.4 % (ref 36.0–46.0)
Hemoglobin: 12.6 g/dL (ref 12.0–15.0)
Lymphocytes Relative: 24.5 % (ref 12.0–46.0)
Lymphs Abs: 2.2 10*3/uL (ref 0.7–4.0)
MCHC: 32.9 g/dL (ref 30.0–36.0)
MCV: 80.3 fl (ref 78.0–100.0)
Monocytes Absolute: 0.6 10*3/uL (ref 0.1–1.0)
Monocytes Relative: 6.6 % (ref 3.0–12.0)
Neutro Abs: 5.9 10*3/uL (ref 1.4–7.7)
Neutrophils Relative %: 65.5 % (ref 43.0–77.0)
Platelets: 388 10*3/uL (ref 150.0–400.0)
RBC: 4.78 Mil/uL (ref 3.87–5.11)
RDW: 16.2 % — ABNORMAL HIGH (ref 11.5–15.5)
WBC: 9 10*3/uL (ref 4.0–10.5)

## 2023-11-22 LAB — LIPID PANEL
Cholesterol: 195 mg/dL (ref 0–200)
HDL: 49.1 mg/dL (ref >39.00)
LDL Cholesterol: 115 mg/dL — ABNORMAL HIGH (ref 0–99)
NonHDL: 146.16
Total CHOL/HDL Ratio: 4
Triglycerides: 156 mg/dL — ABNORMAL HIGH (ref 0.0–149.0)
VLDL: 31.2 mg/dL (ref 0.0–40.0)

## 2023-11-22 LAB — IRON,TIBC AND FERRITIN PANEL
%SAT: 10 % — ABNORMAL LOW (ref 16–45)
Ferritin: 15 ng/mL — ABNORMAL LOW (ref 16–232)
Iron: 37 ug/dL — ABNORMAL LOW (ref 45–160)
TIBC: 387 ug/dL (ref 250–450)

## 2023-11-22 LAB — TSH: TSH: 0.34 u[IU]/mL — ABNORMAL LOW (ref 0.35–5.50)

## 2023-11-22 LAB — HEMOGLOBIN A1C: Hgb A1c MFr Bld: 5.5 % (ref 4.6–6.5)

## 2023-12-05 NOTE — Assessment & Plan Note (Addendum)
 hgba1c acceptable, minimize simple carbs. Increase exercise as tolerated.   Decrease metformin  to 1000 mg daily. Empagliflozin  (Jardiance ) 10 mg daily Tirzepatide  (Mounjaro ) 5 mg weekly injection Recheck 3 months  Lab Results  Component Value Date   HGBA1C 5.5 11/21/2023

## 2023-12-05 NOTE — Assessment & Plan Note (Addendum)
 Restarted atorvastatin  10 mg daily Recheck lipids 3 months Encourage heart healthy diet such as MIND or DASH diet, increase exercise, avoid trans fats, simple carbohydrates and processed foods, consider a krill or fish or flaxseed oil cap daily.

## 2023-12-05 NOTE — Assessment & Plan Note (Addendum)
 Stable.  Symbicort  inh twice daily and albuterol  inhaler as needed.  Refilled Symbicort  today

## 2023-12-05 NOTE — Assessment & Plan Note (Addendum)
 Omeprazole  40 mg daily and Pepcid  40 mg daily. Avoid offending foods, start probiotics. Do not eat large meals in late evening and consider raising head of bed. Continue Famotidine  and Omeprazole 

## 2023-12-05 NOTE — Progress Notes (Signed)
 Subjective:     Patient ID: Kristina Watts, female    DOB: April 02, 1971, 53 y.o.   MRN: [id number]  Chief Complaint  Patient presents with   diabetes follow up    HPI  Kristina Watts is a pleasant 53 year old female who presents for medication management and follow-up on chronic conditions.   ADD Adderall 10 mg daily Takes as needed at work, not using daily.  Reports no side effects.  Diabetes Empagliflozin  (Jardiance ) 10 mg daily, Metformin  1000 mg twice daily, tirzepatide  (Mounjaro ) 5mg  weekly inj. Denies GI side effects Still losing weight steadily  Lab Results  Component Value Date   HGBA1C 5.5 11/21/2023   Wt Readings from Last 3 Encounters:  12/06/23 183 lb 3.2 oz (83.1 kg)  08/21/23 191 lb (86.6 kg)  07/17/23 195 lb 2 oz (88.5 kg)     HLD No current medications Lab Results  Component Value Date   CHOL 195 11/21/2023   HDL 49.10 11/21/2023   LDLCALC 115 (H) 11/21/2023   LDLDIRECT 104.0 04/25/2022   TRIG 156.0 (H) 11/21/2023   CHOLHDL 4 11/21/2023     Asthma Budesonide -Adderall (Symbicort ) 2 puffs daily, using  Albuterol  inhaler, she is using infrequently. Uses more in the spring or when  Compliant  Depression Bupropion  (Wellbutrin  XL) 150 mg daily, Venlafaxine  XR 75 mg twice daily Compliant  GERD Famotidine  (Pepcid ) 40 mg daily and omeprazole  40 mg daily Compliant Upper endoscopy performed November 2023- WNL  Hypothyroidism Levothyroxine  (Synthroid  175  mcg) daily Thyroid  Cancer Hx- Thyroidectomy SxHx Compliant Lab Results  Component Value Date   TSH 0.34 (L) 11/21/2023  Denies  tremors, palpitations, heat intolerance, weight loss, insomnia  Iron deficiency anemia Ferrous fumarate folic acid (Hemocyte) 324-1 mg daily Compliant Lab Results  Component Value Date   IRON 37 (L) 11/21/2023   TIBC 387 11/21/2023   FERRITIN 15 (L) 11/21/2023    Allergy Xyzal 5 mg at bedtime Singulair  10 mg daily- AM Seasonal allergies  worse  HCM -Ophthalmology- DM eye exam 10/2022 -Colonoscopy- Last Feb 2023- polyp removed benign. Next colonoscopy due 10 years (2033). -Mgm- Last 07/2021- WNL. Repeat 1 year. -Immunizations: Shingles- due, Covid- 19 -Pap--Last 04/2022- ASCUS pap (mild inflammatatory changes) and negative HPV testing. Repeat pap in 1-3 years.  Patient denies fever, chills, SOB, CP, palpitations, dyspnea, edema, HA, vision changes, N/V/D, abdominal pain, urinary symptoms, rash, weight changes, and recent illness or hospitalizations.   History of Present Illness              Health Maintenance Due  Topic Date Due   FOOT EXAM  Never done   Pneumococcal Vaccine 38-5 Years old (1 of 2 - PCV) Never done   MAMMOGRAM  08/09/2022   COVID-19 Vaccine (4 - 2024-25 season) 02/19/2023    Past Medical History:  Diagnosis Date   Acute bronchitis 11/19/2012   ADD (attention deficit disorder) 04/27/2012   Allergy    seasonal   Asthma    Back pain 11/07/2013   Cancer (HCC)    thyroid - March 2023   Cervical cancer screening 12/23/2014   Chicken pox as a child   Depression with anxiety 04/18/2011   Dermatitis 05/18/2011   Diabetes mellitus without complication (HCC)    GERD (gastroesophageal reflux disease)    History of gestational diabetes mellitus (GDM) 04/19/2011   History of pancreatitis 04/19/2011   Hyperlipidemia 04/27/2012   LUQ pain 12/12/2011   Migraine 08/23/2011   Otitis media of right ear  05/30/2011   Overweight(278.02) 04/19/2011   Pancreatitis, recurrent 12/12/2011   Pharyngitis 08/23/2011   Pneumonia 1998   Preventative health care 04/19/2011   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 Was done due to  pancreatitis   THYROIDECTOMY N/A 08/27/2021   Procedure: TOTAL THYROIDECTOMY;  Surgeon: Kinsinger, Alphonso Aschoff, MD;  Location: [facility];  Service: General;  Laterality: N/A;   UPPER GASTROINTESTINAL ENDOSCOPY      Family History  Problem Relation Age of Onset   Thyroid  disease Mother    Colon polyps Mother    Hypertension Father    Hyperlipidemia Father    Heart disease Father    Diabetes Father    COPD Father        previous smoker   Stroke Father    Depression Father    Restless legs syndrome Father    Colon polyps Father    Diverticulosis Father    Diabetes Brother    Cancer Maternal Grandmother        Head and Neck   Heart disease Paternal Grandmother    Hyperlipidemia Paternal Grandmother    Hypertension Paternal Grandmother    Diabetes Paternal Grandmother    Depression Paternal Grandmother    Stroke Paternal Grandmother    Restless legs syndrome Paternal Grandmother    Heart disease Paternal Grandfather    Hyperlipidemia Paternal Grandfather    Hypertension Paternal Grandfather    Stroke Paternal Grandfather    Diabetes Paternal Grandfather    Colon cancer Neg Hx    Esophageal cancer Neg Hx    Rectal cancer Neg Hx    Stomach cancer Neg Hx     Social History   Socioeconomic History   Marital status: Married    Spouse name: Not on file   Number of children: Not on file   Years of education: Not on file   Highest education level: Not on file  Occupational History   Not on file  Tobacco Use   Smoking status: Never   Smokeless tobacco: Never  Vaping Use   Vaping status: Never Used  Substance and Sexual Activity   Alcohol use: Yes    Comment: 1/week   Drug use: No   Sexual activity: Not Currently    Partners: Male    Comment: abstinence  Other Topics Concern   Not on file  Social History Narrative   Not on file   Social Drivers of Health   Financial Resource Strain: Not on file  Food Insecurity: Not on file  Transportation Needs: Not on file   Physical Activity: Not on file  Stress: Not on file  Social Connections: Not on file  Intimate Partner Violence: Not on file    Outpatient Medications Prior to Visit  Medication Sig Dispense Refill   acetaminophen  (TYLENOL ) 500 MG tablet Take 1,000 mg by mouth every 6 (six) hours as needed for moderate pain.     albuterol  (VENTOLIN  HFA) 108 (90 Base) MCG/ACT inhaler Inhale 2 puffs into the lungs every 6 (six) hours as needed for wheezing or shortness of breath. 54 g 1   amphetamine -dextroamphetamine  (ADDERALL) 10 MG tablet Take 1 tablet (10 mg total) by mouth daily as needed (focus). March 2025 30 tablet  0   amphetamine -dextroamphetamine  (ADDERALL) 10 MG tablet Take 1 tablet (10 mg total) by mouth daily as needed. April 2025 30 tablet 0   amphetamine -dextroamphetamine  (ADDERALL) 10 MG tablet Take 1 tablet (10 mg total) by mouth daily as needed. May 2025 30 tablet 0   Blood Glucose Monitoring Suppl (ONETOUCH VERIO REFLECT) w/Device KIT USE TO CHECK SUGAR ONCE A DAY AND AS NEEDED. DX CODE: E11.9 1 kit 0   buPROPion  (WELLBUTRIN  XL) 150 MG 24 hr tablet Take 1 tablet (150 mg total) by mouth daily. Due for appt 02/2024 90 tablet 1   Butalbital -Acetaminophen  (BUTALBITAL -APAP) 50-325 MG TABS Take 1 tablet by mouth 3 (three) times daily as needed. 60 each 0   empagliflozin  (JARDIANCE ) 10 MG TABS tablet Take 1 tablet (10 mg total) by mouth daily. 90 tablet 3   famotidine  (PEPCID ) 40 MG tablet Take 1 tablet (40 mg total) by mouth daily. 90 tablet 3   Ferrous Fumarate-Folic Acid (HEMOCYTE-F) 324-1 MG TABS Take 1 tablet by mouth daily. 30 tablet 3   ibuprofen (ADVIL,MOTRIN) 200 MG tablet Take 400 mg by mouth every 6 (six) hours as needed for headache or moderate pain.     ketoconazole  (NIZORAL ) 2 % cream Apply 1 application topically daily. (Patient taking differently: Apply 1 application  topically daily as needed for irritation.) 30 g 1   Lancets (ONETOUCH DELICA PLUS LANCET33G) MISC Use to check sugar  once a day and as needed.  Dx code: E11.9 100 each 1   levocetirizine (XYZAL) 5 MG tablet Take 5 mg by mouth every evening.     montelukast  (SINGULAIR ) 10 MG tablet TAKE 1 TABLET BY MOUTH EVERYDAY AT BEDTIME 90 tablet 3   Multiple Vitamin (MULTIVITAMIN) tablet Take 1 tablet by mouth daily.     omeprazole  (PRILOSEC) 40 MG capsule Take 1 capsule (40 mg total) by mouth daily. 90 capsule 3   ONETOUCH VERIO test strip USE TO CHECK SUGAR TWICE A DAY AND AS NEEDED. DX CODE: E11.65 100 strip 12   promethazine  (PHENERGAN ) 25 MG tablet Take 1 tablet (25 mg total) by mouth every 8 (eight) hours as needed for nausea or vomiting. 30 tablet 1   tirzepatide  (MOUNJARO ) 5 MG/0.5ML Pen Inject 5 mg into the skin once a week. 6 mL 1   triamcinolone  ointment (KENALOG ) 0.1 % Apply 1 Application topically at bedtime. 80 g 1   valACYclovir  (VALTREX ) 1000 MG tablet TAKE 1 TABLET BY MOUTH TWICE A DAY (Patient taking differently: as needed.) 180 tablet 1   venlafaxine  XR (EFFEXOR -XR) 75 MG 24 hr capsule Take 1 capsule (75 mg total) by mouth 2 (two) times daily with breakfast and lunch. 180 capsule 3   budesonide -formoterol  (SYMBICORT ) 160-4.5 MCG/ACT inhaler Inhale 2 puffs into the lungs 2 (two) times daily as needed (shortness of breath or wheezing in the spring). 30.6 g 1   levothyroxine  (SYNTHROID ) 175 MCG tablet Take 1 tablet (175 mcg total) by mouth daily. 90 tablet 3   metFORMIN  (GLUCOPHAGE ) 1000 MG tablet Take 1 tablet (1,000 mg total) by mouth 2 (two) times daily with a meal. LABS FOR FURTHER REFILLS 180 tablet 3   melatonin 3 MG TABS tablet Take 3 mg by mouth at bedtime.     No facility-administered medications prior to visit.    Allergies  Allergen Reactions   Actifed [Triprolidine-Pse] Hives    ROS See HPI    Objective:    Physical Exam  Physical Exam Constitutional:      General:  She is not in acute distress.    Appearance: Normal appearance. She is well-developed. She is not toxic-appearing.   HENT:     Head: Normocephalic and atraumatic.     Right Ear: External ear normal.     Left Ear: External ear normal.     Nose: Nose normal.  Eyes:     General:        Right eye: No discharge.        Left eye: No discharge.     Conjunctiva/sclera: Conjunctivae normal.  Neck:     Thyroid : No thyromegaly.  Cardiovascular:     Rate and Rhythm: Normal rate and regular rhythm.     Heart sounds: Normal heart sounds. No murmur heard. Pulmonary:     Effort: Pulmonary effort is normal. No respiratory distress.     Breath sounds: Normal breath sounds.  Abdominal:     General: Bowel sounds are normal.     Palpations: Abdomen is soft.     Tenderness: There is no abdominal tenderness. There is no guarding.  Musculoskeletal:        General: Normal range of motion.     Cervical back: Neck supple.  Lymphadenopathy:     Cervical: No cervical adenopathy.  Skin:    General: Skin is warm and dry.  Neurological:     Mental Status: She is alert and oriented to person, place, and time.  Psychiatric:        Mood and Affect: Mood normal.        Behavior: Behavior normal.        Thought Content: Thought content normal.        Judgment: Judgment normal.    BP 120/78 (BP Location: Right Arm, Patient Position: Sitting, Cuff Size: Large)   Pulse 80   Temp 97.7 F (36.5 C) (Oral)   Resp 12   Ht 5' 5 (1.651 m)   Wt 183 lb 3.2 oz (83.1 kg)   LMP 11/29/2023   SpO2 94%   BMI 30.49 kg/m  Wt Readings from Last 3 Encounters:  12/06/23 183 lb 3.2 oz (83.1 kg)  08/21/23 191 lb (86.6 kg)  07/17/23 195 lb 2 oz (88.5 kg)       Assessment & Plan:   Problem List Items Addressed This Visit     ADD (attention deficit disorder)   Stable on current meds- 10 mg Adderall daily      Allergy   Continue Singulair  10 mg daily, Xyzal 5 mg at bedtime        Asthma (Chronic)   Stable.  Symbicort  inh twice daily and albuterol  inhaler as needed.  Refilled Symbicort  today      Relevant Medications    budesonide -formoterol  (SYMBICORT ) 160-4.5 MCG/ACT inhaler   Diabetes mellitus without complication (HCC) - Primary   hgba1c acceptable, minimize simple carbs. Increase exercise as tolerated.   Decrease metformin  to 1000 mg daily. Empagliflozin  (Jardiance ) 10 mg daily Tirzepatide  (Mounjaro ) 5 mg weekly injection Recheck 3 months  Lab Results  Component Value Date   HGBA1C 5.5 11/21/2023         Relevant Medications   atorvastatin  (LIPITOR) 10 MG tablet   metFORMIN  (GLUCOPHAGE ) 1000 MG tablet   GERD (gastroesophageal reflux disease)   Omeprazole  40 mg daily and Pepcid  40 mg daily. Avoid offending foods, start probiotics. Do not eat large meals in late evening and consider raising head of bed. Continue Famotidine  and Omeprazole        Hyperlipidemia   Restarted atorvastatin   10 mg daily Recheck lipids 3 months Encourage heart healthy diet such as MIND or DASH diet, increase exercise, avoid trans fats, simple carbohydrates and processed foods, consider a krill or fish or flaxseed oil cap daily.          Relevant Medications   atorvastatin  (LIPITOR) 10 MG tablet   Hypothyroidism   TSH is low, likely due to levothyroxine  over-replacement. Patient is currently taking 175 mcg daily. Will reduce dose to 150 mcg daily and recheck TSH 3 months. Monitor for symptoms of hyperthyroidism.      Relevant Medications   levothyroxine  (SYNTHROID ) 150 MCG tablet   Other Relevant Orders   T4, free   Iron deficiency anemia   CBC stable but iron and ferretin stil low. Continue Ferrous fumarate folic acid (Hemocyte) 324-1 mg daily.  Increase leafy greens, consider increased lean red meat and using cast iron cookware. Continue to monitor, report any concerns. Chronic PPI use may be contributing to low iron.  Recheck CBC and iron panel in 3 months consider iron supplements like 2-3 time a week vs daily when fully restored.   Lab Results  Component Value Date   IRON 37 (L) 11/21/2023   TIBC 387  11/21/2023   FERRITIN 15 (L) 11/21/2023         Need for shingles vaccine   Received Shingles vax 2/2 today      Relevant Orders   Varicella-zoster vaccine IM (Completed)   HCM -Ophthalmology- DDM eye exam 10/2022 -Colonoscopy- Last Feb 2023- polyp removed benign. Next colonoscopy due 10 years (2033). -Mgm- Last 07/2021- WNL. Repeat 1 year. -Immunizations: Shingles- received today, Due: Covid- 19 & PNA -Pap--Last 04/2022- ASCUS pap (mild inflammatatory changes) and negative HPV testing. Repeat pap in 1-3 years. Patient denies fever, chills, SOB, CP, palpitations, dyspnea, edema, HA, vision changes, N/V/D, abdominal pain, urinary symptoms, rash, weight changes, and recent illness or hospitalizations.   Follow-up Plan includes monitoring blood work and medication management. Schedule blood work and FU in three months.-CBC, CMP, iron panel, A1C, ferritin, TSH, T4, lipid panel,  Portions of this note were dictated using DRAGON voice recognition software. Please disregard any errors in transcription.   Patient was educated on the diagnosis, treatment options, potential risks, benefits, and alternatives. All questions were addressed. Patient verbalized understanding and agrees with the plan of care. Will follow up as advised or sooner if symptoms worsen or new concerns arise.    I have discontinued Kristina Watts's levothyroxine . I have also changed her metFORMIN . Additionally, I am having her start on atorvastatin  and levothyroxine . Lastly, I am having her maintain her multivitamin, acetaminophen , ibuprofen, levocetirizine, promethazine , Butalbital -APAP, ketoconazole , OneTouch Delica Plus Lancet33G, OneTouch Verio Reflect, melatonin, valACYclovir , OneTouch Verio, triamcinolone  ointment, albuterol , omeprazole , venlafaxine  XR, Mounjaro , empagliflozin , famotidine , amphetamine -dextroamphetamine , amphetamine -dextroamphetamine , amphetamine -dextroamphetamine , Hemocyte-F, montelukast , buPROPion ,  and budesonide -formoterol .  Meds ordered this encounter  Medications   atorvastatin  (LIPITOR) 10 MG tablet    Sig: Take 1 tablet (10 mg total) by mouth daily.    Dispense:  90 tablet    Refill:  3    Supervising Provider:   Randie Bustle A [[id number]]   metFORMIN  (GLUCOPHAGE ) 1000 MG tablet    Sig: Take 1 tablet (1,000 mg total) by mouth daily. LABS FOR FURTHER REFILLS    Dispense:  90 tablet    Refill:  3    Supervising Provider:   Randie Bustle A [[id number]]   budesonide -formoterol  (SYMBICORT ) 160-4.5 MCG/ACT inhaler    Sig: Inhale 2 puffs into the  lungs 2 (two) times daily as needed (shortness of breath or wheezing in the spring).    Dispense:  30.6 g    Refill:  1    Supervising Provider:   Randie Bustle A [[id number]]   levothyroxine  (SYNTHROID ) 150 MCG tablet    Sig: Take 1 tablet (150 mcg total) by mouth daily.    Dispense:  90 tablet    Refill:  3    Supervising Provider:   Randie Bustle A [[id number]]

## 2023-12-05 NOTE — Assessment & Plan Note (Addendum)
 Stable on current meds- 10 mg Adderall daily

## 2023-12-05 NOTE — Assessment & Plan Note (Addendum)
 TSH is low, likely due to levothyroxine  over-replacement. Patient is currently taking 175 mcg daily. Will reduce dose to 150 mcg daily and recheck TSH 3 months. Monitor for symptoms of hyperthyroidism.

## 2023-12-05 NOTE — Assessment & Plan Note (Signed)
 Continue Singulair  10 mg daily, Xyzal 5 mg at bedtime

## 2023-12-05 NOTE — Assessment & Plan Note (Addendum)
 CBC stable but iron and ferretin stil low. Continue Ferrous fumarate folic acid (Hemocyte) 324-1 mg daily.  Increase leafy greens, consider increased lean red meat and using cast iron cookware. Continue to monitor, report any concerns. Chronic PPI use may be contributing to low iron.  Recheck CBC and iron panel in 3 months consider iron supplements like 2-3 time a week vs daily when fully restored.   Lab Results  Component Value Date   IRON 37 (L) 11/21/2023   TIBC 387 11/21/2023   FERRITIN 15 (L) 11/21/2023

## 2023-12-06 ENCOUNTER — Ambulatory Visit: Admitting: Student

## 2023-12-06 ENCOUNTER — Encounter: Payer: Self-pay | Admitting: Student

## 2023-12-06 VITALS — BP 120/78 | HR 80 | Temp 97.7°F | Resp 12 | Ht 65.0 in | Wt 183.2 lb

## 2023-12-06 DIAGNOSIS — K219 Gastro-esophageal reflux disease without esophagitis: Secondary | ICD-10-CM

## 2023-12-06 DIAGNOSIS — Z7985 Long-term (current) use of injectable non-insulin antidiabetic drugs: Secondary | ICD-10-CM

## 2023-12-06 DIAGNOSIS — E119 Type 2 diabetes mellitus without complications: Secondary | ICD-10-CM

## 2023-12-06 DIAGNOSIS — Z7984 Long term (current) use of oral hypoglycemic drugs: Secondary | ICD-10-CM

## 2023-12-06 DIAGNOSIS — J45909 Unspecified asthma, uncomplicated: Secondary | ICD-10-CM

## 2023-12-06 DIAGNOSIS — E785 Hyperlipidemia, unspecified: Secondary | ICD-10-CM

## 2023-12-06 DIAGNOSIS — Z23 Encounter for immunization: Secondary | ICD-10-CM

## 2023-12-06 DIAGNOSIS — E039 Hypothyroidism, unspecified: Secondary | ICD-10-CM | POA: Diagnosis not present

## 2023-12-06 DIAGNOSIS — D509 Iron deficiency anemia, unspecified: Secondary | ICD-10-CM | POA: Diagnosis not present

## 2023-12-06 DIAGNOSIS — F909 Attention-deficit hyperactivity disorder, unspecified type: Secondary | ICD-10-CM

## 2023-12-06 DIAGNOSIS — T7840XD Allergy, unspecified, subsequent encounter: Secondary | ICD-10-CM

## 2023-12-06 MED ORDER — METFORMIN HCL 1000 MG PO TABS: 1000.0000 mg | ORAL_TABLET | Freq: Every day | ORAL | 3 refills | Status: DC

## 2023-12-06 MED ORDER — LEVOTHYROXINE SODIUM 150 MCG PO TABS: 150.0000 ug | ORAL_TABLET | Freq: Every day | ORAL | 3 refills | Status: AC

## 2023-12-06 MED ORDER — ATORVASTATIN CALCIUM 10 MG PO TABS: 10.0000 mg | ORAL_TABLET | Freq: Every day | ORAL | 3 refills | Status: AC

## 2023-12-06 MED ORDER — BUDESONIDE-FORMOTEROL FUMARATE 160-4.5 MCG/ACT IN AERO: 2.0000 | INHALATION_SPRAY | Freq: Two times a day (BID) | RESPIRATORY_TRACT | 1 refills | Status: DC | PRN

## 2023-12-06 NOTE — Assessment & Plan Note (Signed)
 Received Shingles vax 2/2 today

## 2023-12-07 ENCOUNTER — Other Ambulatory Visit: Payer: Self-pay | Admitting: *Deleted

## 2023-12-07 DIAGNOSIS — E119 Type 2 diabetes mellitus without complications: Secondary | ICD-10-CM

## 2023-12-07 MED ORDER — METFORMIN HCL 1000 MG PO TABS: 1000.0000 mg | ORAL_TABLET | Freq: Two times a day (BID) | ORAL | 1 refills | Status: DC

## 2024-02-24 ENCOUNTER — Other Ambulatory Visit: Payer: Self-pay | Admitting: Family Medicine

## 2024-03-01 ENCOUNTER — Other Ambulatory Visit (INDEPENDENT_AMBULATORY_CARE_PROVIDER_SITE_OTHER)

## 2024-03-01 ENCOUNTER — Other Ambulatory Visit

## 2024-03-01 DIAGNOSIS — E039 Hypothyroidism, unspecified: Secondary | ICD-10-CM | POA: Diagnosis not present

## 2024-03-01 DIAGNOSIS — E785 Hyperlipidemia, unspecified: Secondary | ICD-10-CM

## 2024-03-01 DIAGNOSIS — D509 Iron deficiency anemia, unspecified: Secondary | ICD-10-CM

## 2024-03-01 DIAGNOSIS — E119 Type 2 diabetes mellitus without complications: Secondary | ICD-10-CM | POA: Diagnosis not present

## 2024-03-01 LAB — LIPID PANEL
Cholesterol: 130 mg/dL (ref 0–200)
HDL: 46.9 mg/dL (ref >39.00)
LDL Cholesterol: 64 mg/dL (ref 0–99)
NonHDL: 83.27
Total CHOL/HDL Ratio: 3
Triglycerides: 96 mg/dL (ref 0.0–149.0)
VLDL: 19.2 mg/dL (ref 0.0–40.0)

## 2024-03-01 LAB — COMPREHENSIVE METABOLIC PANEL WITH GFR
ALT: 14 U/L (ref 0–35)
AST: 14 U/L (ref 0–37)
Albumin: 4.6 g/dL (ref 3.5–5.2)
Alkaline Phosphatase: 56 U/L (ref 39–117)
BUN: 10 mg/dL (ref 6–23)
CO2: 25 meq/L (ref 19–32)
Calcium: 9.1 mg/dL (ref 8.4–10.5)
Chloride: 104 meq/L (ref 96–112)
Creatinine, Ser: 0.87 mg/dL (ref 0.40–1.20)
GFR: 77.4 mL/min (ref >60.00)
Glucose, Bld: 126 mg/dL — ABNORMAL HIGH (ref 70–99)
Potassium: 4.1 meq/L (ref 3.5–5.1)
Sodium: 138 meq/L (ref 135–145)
Total Bilirubin: 0.6 mg/dL (ref 0.2–1.2)
Total Protein: 6.9 g/dL (ref 6.0–8.3)

## 2024-03-01 LAB — CBC WITH DIFFERENTIAL/PLATELET
Basophils Absolute: 0.1 K/uL (ref 0.0–0.1)
Basophils Relative: 0.9 % (ref 0.0–3.0)
Eosinophils Absolute: 0.2 K/uL (ref 0.0–0.7)
Eosinophils Relative: 2.9 % (ref 0.0–5.0)
HCT: 42.3 % (ref 36.0–46.0)
Hemoglobin: 14.2 g/dL (ref 12.0–15.0)
Lymphocytes Relative: 17.1 % (ref 12.0–46.0)
Lymphs Abs: 1.3 K/uL (ref 0.7–4.0)
MCHC: 33.4 g/dL (ref 30.0–36.0)
MCV: 87.8 fl (ref 78.0–100.0)
Monocytes Absolute: 0.4 K/uL (ref 0.1–1.0)
Monocytes Relative: 5.2 % (ref 3.0–12.0)
Neutro Abs: 5.7 K/uL (ref 1.4–7.7)
Neutrophils Relative %: 73.9 % (ref 43.0–77.0)
Platelets: 321 K/uL (ref 150.0–400.0)
RBC: 4.82 Mil/uL (ref 3.87–5.11)
RDW: 15.1 % (ref 11.5–15.5)
WBC: 7.8 K/uL (ref 4.0–10.5)

## 2024-03-01 LAB — IRON,TIBC AND FERRITIN PANEL
%SAT: 24 % (ref 16–45)
Ferritin: 20 ng/mL (ref 16–232)
Iron: 80 ug/dL (ref 45–160)
TIBC: 333 ug/dL (ref 250–450)

## 2024-03-01 LAB — T4, FREE: Free T4: 1.86 ng/dL — ABNORMAL HIGH (ref 0.60–1.60)

## 2024-03-01 LAB — HEMOGLOBIN A1C: Hgb A1c MFr Bld: 5.5 % (ref 4.6–6.5)

## 2024-03-01 LAB — TSH: TSH: 0.54 u[IU]/mL (ref 0.35–5.50)

## 2024-03-03 ENCOUNTER — Other Ambulatory Visit: Payer: Self-pay | Admitting: Family Medicine

## 2024-03-04 ENCOUNTER — Ambulatory Visit: Payer: Self-pay | Admitting: Student

## 2024-03-05 NOTE — Progress Notes (Unsigned)
 Subjective:     Patient ID: Kristina Watts, female    DOB: [birth year], 53 y.o.   MRN: [id number]  No chief complaint on file.   HPI Kristina Watts is a 53 yo female presents for annual physical and FU chronic conditions.    ADD Adderall 10 mg daily Takes as needed at work, not using daily.  Reports no side effects.  Diabetes Empagliflozin  (Jardiance ) 10 mg daily, Metformin  1000 mg twice daily, tirzepatide  (Mounjaro ) 5mg  weekly inj. Denies GI side effects Still losing weight steadily  Lab Results  Component Value Date   HGBA1C 5.5 03/01/2024    **update  Wt Readings from Last 3 Encounters:  12/06/23 183 lb 3.2 oz (83.1 kg)  08/21/23 191 lb (86.6 kg)  07/17/23 195 lb 2 oz (88.5 kg)     HLD No current medications Lab Results  Component Value Date   CHOL 130 03/01/2024   HDL 46.90 03/01/2024   LDLCALC 64 03/01/2024   LDLDIRECT 104.0 04/25/2022   TRIG 96.0 03/01/2024   CHOLHDL 3 03/01/2024     Asthma Budesonide -Adderall (Symbicort ) 2 puffs daily, using  Albuterol  inhaler, she is using infrequently. Uses more in the spring or when  Compliant  Depression Bupropion  (Wellbutrin  XL) 150 mg daily, Venlafaxine  XR 75 mg twice daily Compliant  GERD Famotidine  (Pepcid ) 40 mg daily and omeprazole  40 mg daily Compliant Upper endoscopy performed November 2023- WNL  Hypothyroidism Levothyroxine  (Synthroid ) 150 mcg daily Thyroid  Cancer Hx- Thyroidectomy SxHx Compliant Lab Results  Component Value Date   TSH 0.54 03/01/2024  Denies  tremors, palpitations, heat intolerance, weight loss, insomnia  Iron deficiency anemia Ferrous fumarate folic acid (Hemocyte) 324-1 mg daily Compliant  Lab Results  Component Value Date   IRON 80 03/01/2024   TIBC 333 03/01/2024   FERRITIN 20 03/01/2024     Allergy Xyzal 5 mg at bedtime Singulair  10 mg daily- AM Seasonal allergies worse  HCM -Ophthalmology- DM eye exam 10/2022 -Colonoscopy- Last Feb 2023- polyp removed  benign. Next colonoscopy due 10 years (2033). -Mgm- Last 07/2021- WNL. Repeat 1 year. -Immunizations: Shingles- due, Covid- 19 -Pap--Last 04/2022- ASCUS pap (mild inflammatatory changes) and negative HPV testing. Repeat pap in 1-3 years.  Patient denies fever, chills, SOB, CP, palpitations, dyspnea, edema, HA, vision changes, N/V/D, abdominal pain, urinary symptoms, rash, weight changes, and recent illness or hospitalizations.   History of Present Illness              Health Maintenance Due  Topic Date Due   FOOT EXAM  Never done   Pneumococcal Vaccine: 50+ Years (1 of 2 - PCV) Never done   Hepatitis B Vaccines 19-59 Average Risk (1 of 3 - 19+ 3-dose series) Never done   Mammogram  08/09/2022   Influenza Vaccine  01/19/2024   COVID-19 Vaccine (4 - 2025-26 season) 02/19/2024    Past Medical History:  Diagnosis Date   Acute bronchitis 11/19/2012   ADD (attention deficit disorder) 04/27/2012   Allergy    seasonal   Asthma    Back pain 11/07/2013   Cancer (HCC)    thyroid - March 2023   Cervical cancer screening 12/23/2014   Chicken pox as a child   Depression with anxiety 04/18/2011   Dermatitis 05/18/2011   Diabetes mellitus without complication (HCC)    GERD (gastroesophageal reflux disease)    History of gestational diabetes mellitus (GDM) 04/19/2011   History of pancreatitis 04/19/2011   Hyperlipidemia 04/27/2012   LUQ pain 12/12/2011  Migraine 08/23/2011   Otitis media of right ear 05/30/2011   Overweight(278.02) 04/19/2011   Pancreatitis, recurrent 12/12/2011   Pharyngitis 08/23/2011   Pneumonia 1998   Preventative health care 04/19/2011   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 Was done  due to pancreatitis   THYROIDECTOMY N/A 08/27/2021   Procedure: TOTAL THYROIDECTOMY;  Surgeon: Kinsinger, Herlene Righter, MD;  Location: [facility];  Service: General;  Laterality: N/A;   UPPER GASTROINTESTINAL ENDOSCOPY      Family History  Problem Relation Age of Onset   Thyroid  disease Mother    Colon polyps Mother    Hypertension Father    Hyperlipidemia Father    Heart disease Father    Diabetes Father    COPD Father        previous smoker   Stroke Father    Depression Father    Restless legs syndrome Father    Colon polyps Father    Diverticulosis Father    Diabetes Brother    Cancer Maternal Grandmother        Head and Neck   Heart disease Paternal Grandmother    Hyperlipidemia Paternal Grandmother    Hypertension Paternal Grandmother    Diabetes Paternal Grandmother    Depression Paternal Grandmother    Stroke Paternal Grandmother    Restless legs syndrome Paternal Grandmother    Heart disease Paternal Grandfather    Hyperlipidemia Paternal Grandfather    Hypertension Paternal Grandfather    Stroke Paternal Grandfather    Diabetes Paternal Grandfather    Colon cancer Neg Hx    Esophageal cancer Neg Hx    Rectal cancer Neg Hx    Stomach cancer Neg Hx     Social History   Socioeconomic History   Marital status: Married    Spouse name: Not on file   Number of children: Not on file   Years of education: Not on file   Highest education level: Not on file  Occupational History   Not on file  Tobacco Use   Smoking status: Never   Smokeless tobacco: Never  Vaping Use   Vaping status: Never Used  Substance and Sexual Activity   Alcohol use: Yes    Comment: 1/week   Drug use: No   Sexual activity: Not Currently    Partners: Male    Comment: abstinence  Other Topics Concern   Not on file  Social History Narrative   Not on file   Social Drivers of Health   Financial Resource Strain: Not on file  Food Insecurity: Not on file  Transportation Needs: Not on  file  Physical Activity: Not on file  Stress: Not on file  Social Connections: Not on file  Intimate Partner Violence: Not on file    Outpatient Medications Prior to Visit  Medication Sig Dispense Refill   acetaminophen  (TYLENOL ) 500 MG tablet Take 1,000 mg by mouth every 6 (six) hours as needed for moderate pain.     albuterol  (VENTOLIN  HFA) 108 (90 Base) MCG/ACT inhaler Inhale 2 puffs into the lungs every 6 (six) hours as needed for wheezing or shortness of breath. 54 g 1   amphetamine -dextroamphetamine  (ADDERALL) 10 MG tablet Take 1 tablet (10 mg total) by  mouth daily as needed (focus). March 2025 30 tablet 0   amphetamine -dextroamphetamine  (ADDERALL) 10 MG tablet Take 1 tablet (10 mg total) by mouth daily as needed. May 2025 30 tablet 0   atorvastatin  (LIPITOR) 10 MG tablet Take 1 tablet (10 mg total) by mouth daily. 90 tablet 3   Blood Glucose Monitoring Suppl (ONETOUCH VERIO REFLECT) w/Device KIT USE TO CHECK SUGAR ONCE A DAY AND AS NEEDED. DX CODE: E11.9 1 kit 0   budesonide -formoterol  (SYMBICORT ) 160-4.5 MCG/ACT inhaler Inhale 2 puffs into the lungs 2 (two) times daily as needed (shortness of breath or wheezing in the spring). 30.6 g 1   buPROPion  (WELLBUTRIN  XL) 150 MG 24 hr tablet Take 1 tablet (150 mg total) by mouth daily. Due for appt 02/2024 90 tablet 1   Butalbital -Acetaminophen  (BUTALBITAL -APAP) 50-325 MG TABS Take 1 tablet by mouth 3 (three) times daily as needed. 60 each 0   empagliflozin  (JARDIANCE ) 10 MG TABS tablet Take 1 tablet (10 mg total) by mouth daily. 90 tablet 3   famotidine  (PEPCID ) 40 MG tablet Take 1 tablet (40 mg total) by mouth daily. 90 tablet 3   Ferrous Fumarate-Folic Acid (HEMOCYTE-F) 324-1 MG TABS Take 1 tablet by mouth daily. 30 tablet 3   ibuprofen (ADVIL,MOTRIN) 200 MG tablet Take 400 mg by mouth every 6 (six) hours as needed for headache or moderate pain.     ketoconazole  (NIZORAL ) 2 % cream Apply 1 application topically daily. (Patient taking  differently: Apply 1 application  topically daily as needed for irritation.) 30 g 1   Lancets (ONETOUCH DELICA PLUS LANCET33G) MISC Use to check sugar once a day and as needed.  Dx code: E11.9 100 each 1   levocetirizine (XYZAL) 5 MG tablet Take 5 mg by mouth every evening.     levothyroxine  (SYNTHROID ) 150 MCG tablet Take 1 tablet (150 mcg total) by mouth daily. 90 tablet 3   metFORMIN  (GLUCOPHAGE ) 1000 MG tablet Take 1 tablet (1,000 mg total) by mouth 2 (two) times daily with a meal. 90 tablet 1   montelukast  (SINGULAIR ) 10 MG tablet TAKE 1 TABLET BY MOUTH EVERYDAY AT BEDTIME 90 tablet 3   Multiple Vitamin (MULTIVITAMIN) tablet Take 1 tablet by mouth daily.     omeprazole  (PRILOSEC) 40 MG capsule Take 1 capsule (40 mg total) by mouth daily. 90 capsule 3   ONETOUCH VERIO test strip USE TO CHECK SUGAR TWICE A DAY AND AS NEEDED. DX CODE: E11.65 100 strip 12   promethazine  (PHENERGAN ) 25 MG tablet Take 1 tablet (25 mg total) by mouth every 8 (eight) hours as needed for nausea or vomiting. 30 tablet 1   tirzepatide  (MOUNJARO ) 5 MG/0.5ML Pen Inject 5 mg into the skin once a week. 3 mL 1   triamcinolone  ointment (KENALOG ) 0.1 % Apply 1 Application topically at bedtime. 80 g 1   valACYclovir  (VALTREX ) 1000 MG tablet TAKE 1 TABLET BY MOUTH TWICE A DAY (Patient taking differently: as needed.) 180 tablet 1   venlafaxine  XR (EFFEXOR -XR) 75 MG 24 hr capsule Take 1 capsule (75 mg total) by mouth 2 (two) times daily with breakfast and lunch. 180 capsule 3   No facility-administered medications prior to visit.    Allergies  Allergen Reactions   Actifed [Triprolidine-Pse] Hives    ROS See HPI    Objective:    Physical Exam  Physical Exam Constitutional:      General: She is not in acute distress.    Appearance: Normal appearance. She is well-developed.  She is not toxic-appearing.  HENT:     Head: Normocephalic and atraumatic.     Right Ear: External ear normal.     Left Ear: External ear  normal.     Nose: Nose normal.  Eyes:     General:        Right eye: No discharge.        Left eye: No discharge.     Conjunctiva/sclera: Conjunctivae normal.  Neck:     Thyroid : No thyromegaly.  Cardiovascular:     Rate and Rhythm: Normal rate and regular rhythm.     Heart sounds: Normal heart sounds. No murmur heard. Pulmonary:     Effort: Pulmonary effort is normal. No respiratory distress.     Breath sounds: Normal breath sounds.  Abdominal:     General: Bowel sounds are normal.     Palpations: Abdomen is soft.     Tenderness: There is no abdominal tenderness. There is no guarding.  Musculoskeletal:        General: Normal range of motion.     Cervical back: Neck supple.  Lymphadenopathy:     Cervical: No cervical adenopathy.  Skin:    General: Skin is warm and dry.  Neurological:     Mental Status: She is alert and oriented to person, place, and time.  Psychiatric:        Mood and Affect: Mood normal.        Behavior: Behavior normal.        Thought Content: Thought content normal.        Judgment: Judgment normal.    There were no vitals taken for this visit. Wt Readings from Last 3 Encounters:  12/06/23 183 lb 3.2 oz (83.1 kg)  08/21/23 191 lb (86.6 kg)  07/17/23 195 lb 2 oz (88.5 kg)       Assessment & Plan:   Problem List Items Addressed This Visit   None   HCM -Ophthalmology- DDM eye exam 10/2022 -Colonoscopy- Last Feb 2023- polyp removed benign. Next colonoscopy due 10 years (2033). -Mgm- Last 07/2021- WNL. Repeat 1 year. -Immunizations: Shingles- received today, Due: Covid- 19 & PNA -Pap--Last 04/2022- ASCUS pap (mild inflammatatory changes) and negative HPV testing. Repeat pap in 1-3 years. Patient denies fever, chills, SOB, CP, palpitations, dyspnea, edema, HA, vision changes, N/V/D, abdominal pain, urinary symptoms, rash, weight changes, and recent illness or hospitalizations.   Follow-up Plan includes monitoring blood work and medication  management. Schedule blood work and FU in three months.-CBC, CMP, iron panel, A1C, ferritin, TSH, T4, lipid panel,  Portions of this note were dictated using DRAGON voice recognition software. Please disregard any errors in transcription.   Patient was educated on the diagnosis, treatment options, potential risks, benefits, and alternatives. All questions were addressed. Patient verbalized understanding and agrees with the plan of care. Will follow up as advised or sooner if symptoms worsen or new concerns arise.    I am having Kristina Watts Ann maintain her multivitamin, acetaminophen , ibuprofen, levocetirizine, promethazine , Butalbital -APAP, ketoconazole , OneTouch Delica Plus Lancet33G, OneTouch Verio Reflect, valACYclovir , OneTouch Verio, triamcinolone  ointment, albuterol , omeprazole , venlafaxine  XR, empagliflozin , famotidine , amphetamine -dextroamphetamine , amphetamine -dextroamphetamine , Hemocyte-F, montelukast , buPROPion , atorvastatin , budesonide -formoterol , levothyroxine , metFORMIN , and Mounjaro .  No orders of the defined types were placed in this encounter.

## 2024-03-07 ENCOUNTER — Encounter: Payer: Self-pay | Admitting: Student

## 2024-03-07 ENCOUNTER — Ambulatory Visit: Admitting: Student

## 2024-03-07 VITALS — BP 105/60 | HR 82 | Temp 98.0°F | Resp 12 | Ht 65.0 in | Wt 183.0 lb

## 2024-03-07 DIAGNOSIS — Z0000 Encounter for general adult medical examination without abnormal findings: Secondary | ICD-10-CM

## 2024-03-07 DIAGNOSIS — E039 Hypothyroidism, unspecified: Secondary | ICD-10-CM | POA: Diagnosis not present

## 2024-03-07 DIAGNOSIS — B009 Herpesviral infection, unspecified: Secondary | ICD-10-CM

## 2024-03-07 DIAGNOSIS — E119 Type 2 diabetes mellitus without complications: Secondary | ICD-10-CM | POA: Diagnosis not present

## 2024-03-07 DIAGNOSIS — D509 Iron deficiency anemia, unspecified: Secondary | ICD-10-CM | POA: Diagnosis not present

## 2024-03-07 DIAGNOSIS — F418 Other specified anxiety disorders: Secondary | ICD-10-CM

## 2024-03-07 DIAGNOSIS — J45909 Unspecified asthma, uncomplicated: Secondary | ICD-10-CM

## 2024-03-07 DIAGNOSIS — F909 Attention-deficit hyperactivity disorder, unspecified type: Secondary | ICD-10-CM

## 2024-03-07 MED ORDER — METFORMIN HCL 1000 MG PO TABS: 1000.0000 mg | ORAL_TABLET | Freq: Every day | ORAL | 3 refills | Status: AC

## 2024-03-07 MED ORDER — BUDESONIDE-FORMOTEROL FUMARATE 160-4.5 MCG/ACT IN AERO: 2.0000 | INHALATION_SPRAY | Freq: Two times a day (BID) | RESPIRATORY_TRACT | 1 refills | Status: AC | PRN

## 2024-03-07 NOTE — Assessment & Plan Note (Signed)
 Patient remains stable on current medications most days. Denies SI/HI. Reports increased stress related to work and caregiving responsibilities, as her husband's MS has recently worsened, resulting in greater caregiver burden. Counseling and therapy resources have been provided in the AVS.     03/07/2024    4:25 PM 11/10/2022    3:44 PM 07/12/2022   10:28 AM  GAD 7 : Generalized Anxiety Score  Nervous, Anxious, on Edge 2 0 0  Control/stop worrying 0 0 0  Worry too much - different things 0 0 0  Trouble relaxing 0 0 0  Restless 1 0 0  Easily annoyed or irritable 2 0 2  Afraid - awful might happen 0 0 0  Total GAD 7 Score 5 0 2  Anxiety Difficulty Not difficult at all Not difficult at all Not difficult at all     [facility] Visit from 03/07/2024 in [facility]  PHQ-9 Total Score 5

## 2024-03-07 NOTE — Patient Instructions (Signed)

## 2024-03-07 NOTE — Assessment & Plan Note (Signed)
 Stable on current meds- 10 mg Adderall daily

## 2024-03-08 ENCOUNTER — Ambulatory Visit: Admitting: Student

## 2024-04-07 ENCOUNTER — Other Ambulatory Visit: Payer: Self-pay | Admitting: Family Medicine

## 2024-05-14 ENCOUNTER — Other Ambulatory Visit: Payer: Self-pay | Admitting: Family Medicine

## 2024-07-15 ENCOUNTER — Other Ambulatory Visit

## 2024-07-22 ENCOUNTER — Ambulatory Visit: Admitting: Family Medicine
